# Patient Record
Sex: Female | Born: 1945 | Race: White | Hispanic: No | Smoking: Former smoker
Health system: Southern US, Community
[De-identification: ages and names within clinical notes are randomized; demographics above are authoritative.]

## PROBLEM LIST (undated history)

## (undated) DIAGNOSIS — K219 Gastro-esophageal reflux disease without esophagitis: Secondary | ICD-10-CM

## (undated) DIAGNOSIS — I10 Essential (primary) hypertension: Secondary | ICD-10-CM

## (undated) DIAGNOSIS — R7303 Prediabetes: Secondary | ICD-10-CM

## (undated) DIAGNOSIS — H269 Unspecified cataract: Secondary | ICD-10-CM

## (undated) DIAGNOSIS — I739 Peripheral vascular disease, unspecified: Secondary | ICD-10-CM

## (undated) DIAGNOSIS — C50911 Malignant neoplasm of unspecified site of right female breast: Secondary | ICD-10-CM

## (undated) DIAGNOSIS — M1990 Unspecified osteoarthritis, unspecified site: Secondary | ICD-10-CM

## (undated) DIAGNOSIS — E7800 Pure hypercholesterolemia, unspecified: Secondary | ICD-10-CM

## (undated) DIAGNOSIS — E785 Hyperlipidemia, unspecified: Secondary | ICD-10-CM

## (undated) DIAGNOSIS — C50919 Malignant neoplasm of unspecified site of unspecified female breast: Secondary | ICD-10-CM

## (undated) DIAGNOSIS — J189 Pneumonia, unspecified organism: Secondary | ICD-10-CM

## (undated) DIAGNOSIS — E119 Type 2 diabetes mellitus without complications: Secondary | ICD-10-CM

## (undated) DIAGNOSIS — B029 Zoster without complications: Secondary | ICD-10-CM

## (undated) HISTORY — DX: Malignant neoplasm of unspecified site of unspecified female breast: C50.919

## (undated) HISTORY — DX: Essential (primary) hypertension: I10

## (undated) HISTORY — PX: ILIAC ARTERY STENT: SHX1786

## (undated) HISTORY — DX: Peripheral vascular disease, unspecified: I73.9

## (undated) HISTORY — PX: TOENAIL TRIMMING: SHX6631

## (undated) HISTORY — DX: Unspecified osteoarthritis, unspecified site: M19.90

## (undated) HISTORY — DX: Gastro-esophageal reflux disease without esophagitis: K21.9

## (undated) HISTORY — DX: Unspecified cataract: H26.9

## (undated) HISTORY — DX: Pure hypercholesterolemia, unspecified: E78.00

## (undated) HISTORY — DX: Zoster without complications: B02.9

---
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
## 2002-08-24 HISTORY — PX: MENISCUS REPAIR: SHX5179

## 2007-08-25 HISTORY — PX: MENISCECTOMY: SHX123

## 2015-05-13 LAB — HM COLONOSCOPY

## 2015-10-25 DIAGNOSIS — I10 Essential (primary) hypertension: Secondary | ICD-10-CM | POA: Insufficient documentation

## 2015-10-25 DIAGNOSIS — E785 Hyperlipidemia, unspecified: Secondary | ICD-10-CM

## 2015-10-25 HISTORY — DX: Essential (primary) hypertension: I10

## 2015-10-25 HISTORY — DX: Hyperlipidemia, unspecified: E78.5

## 2015-10-25 LAB — LIPID PANEL
Cholesterol: 197 (ref 0–200)
HDL: 67 (ref 35–70)
LDL Cholesterol: 105
Triglycerides: 126 (ref 40–160)

## 2015-10-25 LAB — CBC AND DIFFERENTIAL
HCT: 40 (ref 36–46)
Hemoglobin: 13.5 (ref 12.0–16.0)
Platelets: 254 (ref 150–399)
WBC: 7.3

## 2015-10-25 LAB — BASIC METABOLIC PANEL
BUN: 15 (ref 4–21)
Creatinine: 0.7 (ref 0.5–1.1)
Glucose: 97
Potassium: 4.1 (ref 3.4–5.3)
Sodium: 139 (ref 137–147)

## 2015-10-25 LAB — VITAMIN D 25 HYDROXY (VIT D DEFICIENCY, FRACTURES): Vit D, 25-Hydroxy: 30.9

## 2015-10-25 LAB — HEPATIC FUNCTION PANEL
ALT: 29 (ref 7–35)
AST: 27 (ref 13–35)
Alkaline Phosphatase: 71 (ref 25–125)
Bilirubin, Total: 0.5

## 2015-10-25 LAB — TSH: TSH: 3.1 (ref 0.41–5.90)

## 2016-08-24 HISTORY — PX: COLONOSCOPY: SHX174

## 2016-08-24 HISTORY — PX: CATARACT EXTRACTION: SUR2

## 2016-11-09 LAB — HEPATIC FUNCTION PANEL
ALT: 38 — AB (ref 7–35)
AST: 33 (ref 13–35)
Alkaline Phosphatase: 67 (ref 25–125)
Bilirubin, Total: 0.6

## 2016-11-09 LAB — BASIC METABOLIC PANEL
BUN: 13 (ref 4–21)
Creatinine: 0.6 (ref 0.5–1.1)
Glucose: 128
Potassium: 4.1 (ref 3.4–5.3)
Sodium: 138 (ref 137–147)

## 2016-11-09 LAB — CBC AND DIFFERENTIAL
HCT: 41 (ref 36–46)
Hemoglobin: 13.6 (ref 12.0–16.0)
Platelets: 238 (ref 150–399)
WBC: 7

## 2016-11-09 LAB — LIPID PANEL
Cholesterol: 168 (ref 0–200)
HDL: 55 (ref 35–70)
LDL Cholesterol: 92
Triglycerides: 111 (ref 40–160)

## 2016-11-09 LAB — TSH: TSH: 2.06 (ref 0.41–5.90)

## 2017-11-16 LAB — BASIC METABOLIC PANEL
BUN: 19 (ref 4–21)
Creatinine: 0.6 (ref 0.5–1.1)
Glucose: 121
Potassium: 4.2 (ref 3.4–5.3)
Sodium: 138 (ref 137–147)

## 2017-11-16 LAB — CBC AND DIFFERENTIAL
HCT: 41 (ref 36–46)
Hemoglobin: 13.8 (ref 12.0–16.0)
Platelets: 242 (ref 150–399)
WBC: 6.4

## 2017-11-16 LAB — LIPID PANEL
Cholesterol: 176 (ref 0–200)
HDL: 57 (ref 35–70)
LDL Cholesterol: 102
Triglycerides: 78 (ref 40–160)

## 2017-11-16 LAB — HEPATIC FUNCTION PANEL
ALT: 34 (ref 7–35)
AST: 26 (ref 13–35)
Alkaline Phosphatase: 62 (ref 25–125)
Bilirubin, Total: 0.5

## 2017-11-16 LAB — TSH: TSH: 1.43 (ref 0.41–5.90)

## 2017-11-16 LAB — HEMOGLOBIN A1C: Hemoglobin A1C: 6.6

## 2017-11-19 DIAGNOSIS — R7303 Prediabetes: Secondary | ICD-10-CM | POA: Insufficient documentation

## 2017-11-19 HISTORY — DX: Prediabetes: R73.03

## 2018-10-18 DIAGNOSIS — C50911 Malignant neoplasm of unspecified site of right female breast: Secondary | ICD-10-CM

## 2018-10-18 DIAGNOSIS — C50812 Malignant neoplasm of overlapping sites of left female breast: Secondary | ICD-10-CM

## 2018-10-18 HISTORY — DX: Malignant neoplasm of overlapping sites of left female breast: C50.812

## 2018-10-18 HISTORY — DX: Malignant neoplasm of unspecified site of right female breast: C50.911

## 2019-01-14 LAB — BASIC METABOLIC PANEL
BUN: 17 (ref 4–21)
Creatinine: 0.7 (ref 0.5–1.1)
Glucose: 110
Potassium: 4.3 (ref 3.4–5.3)
Sodium: 136 — AB (ref 137–147)

## 2019-01-14 LAB — HEPATIC FUNCTION PANEL
ALT: 25 (ref 7–35)
AST: 23 (ref 13–35)
Alkaline Phosphatase: 83 (ref 25–125)
Bilirubin, Total: 0.7

## 2019-01-14 LAB — CBC AND DIFFERENTIAL
HCT: 41 (ref 36–46)
Hemoglobin: 14.3 (ref 12.0–16.0)
Platelets: 272 (ref 150–399)
WBC: 6.3

## 2019-01-14 LAB — PROTIME-INR: Protime: 9.9 — AB (ref 10.0–13.8)

## 2019-01-14 LAB — POCT INR: INR: 1 (ref 0.9–1.1)

## 2019-02-02 HISTORY — PX: MASTECTOMY: SHX3

## 2019-05-10 ENCOUNTER — Other Ambulatory Visit: Payer: Self-pay

## 2019-05-10 ENCOUNTER — Non-Acute Institutional Stay: Payer: Managed Care, Other (non HMO) | Admitting: Internal Medicine

## 2019-05-10 ENCOUNTER — Encounter: Payer: Self-pay | Admitting: Internal Medicine

## 2019-05-10 VITALS — BP 144/86 | HR 73 | Temp 99.1°F | Ht 67.5 in | Wt 171.0 lb

## 2019-05-10 DIAGNOSIS — E785 Hyperlipidemia, unspecified: Secondary | ICD-10-CM

## 2019-05-10 DIAGNOSIS — Z860100 Personal history of colon polyps, unspecified: Secondary | ICD-10-CM

## 2019-05-10 DIAGNOSIS — T451X5A Adverse effect of antineoplastic and immunosuppressive drugs, initial encounter: Secondary | ICD-10-CM

## 2019-05-10 DIAGNOSIS — C50911 Malignant neoplasm of unspecified site of right female breast: Secondary | ICD-10-CM

## 2019-05-10 DIAGNOSIS — M2550 Pain in unspecified joint: Secondary | ICD-10-CM

## 2019-05-10 DIAGNOSIS — R7303 Prediabetes: Secondary | ICD-10-CM

## 2019-05-10 DIAGNOSIS — M150 Primary generalized (osteo)arthritis: Secondary | ICD-10-CM

## 2019-05-10 DIAGNOSIS — K5790 Diverticulosis of intestine, part unspecified, without perforation or abscess without bleeding: Secondary | ICD-10-CM

## 2019-05-10 DIAGNOSIS — I10 Essential (primary) hypertension: Secondary | ICD-10-CM | POA: Diagnosis not present

## 2019-05-10 DIAGNOSIS — M8949 Other hypertrophic osteoarthropathy, multiple sites: Secondary | ICD-10-CM

## 2019-05-10 DIAGNOSIS — C50912 Malignant neoplasm of unspecified site of left female breast: Secondary | ICD-10-CM

## 2019-05-10 DIAGNOSIS — M159 Polyosteoarthritis, unspecified: Secondary | ICD-10-CM | POA: Insufficient documentation

## 2019-05-10 HISTORY — DX: Personal history of colon polyps, unspecified: Z86.0100

## 2019-05-10 HISTORY — DX: Adverse effect of antineoplastic and immunosuppressive drugs, initial encounter: T45.1X5A

## 2019-05-10 HISTORY — DX: Diverticulosis of intestine, part unspecified, without perforation or abscess without bleeding: K57.90

## 2019-05-10 HISTORY — DX: Adverse effect of antineoplastic and immunosuppressive drugs, initial encounter: M25.50

## 2019-05-10 HISTORY — DX: Primary generalized (osteo)arthritis: M15.0

## 2019-05-10 NOTE — Progress Notes (Signed)
Provider:  Rexene Edison. Mariea Clonts, D.O., C.M.D. Location:  Occupational psychologist of Service:  Clinic (12)  Previous PCP: Gayland Curry, DO Patient Care Team: Gayland Curry, DO as PCP - General (Geriatric Medicine)  Extended Emergency Contact Information Primary Emergency Contact: [place] Mobile Phone: [phone] Relation: Sister  Goals of Care: Advanced Directive information Advanced Directives 05/10/2019  Does Patient Have a Medical Advance Directive? Yes  Type of Paramedic of [place];Living will  Copy of [place] in Chart? No - copy requested   Chief Complaint  Patient presents with  . Establish Care    New patient establish care. Blood pressure concerns   . Immunizations    Will get flu vaccine at local pharmacy     HPI: Patient is a 73 y.o. female seen today to establish with [facility].  Records have been requested from prior PCP in [place]--she's still seeing them some through the end of the year so she will request records sent then (many in careeverywhere anyway).  She has a h/o htn, hyperlipidemia, breast cancer and arthritis.  She's had bilateral mastectomies 02/02/19.  BP has recently been an issue.  Before her surgery she was placed on anastrazole b/c it was thought that her estrogen supplement caused her cancer.  She was off of it for a bit before and for 3 wks after her sugery.   BP going up before surgery, then postop, it was better. 3 wks after her surgery, she was switched to letrozole--early July.  A few weeks ago, she felt lightheadedness and she checked her BP and it was 178/98.  She thought this is nuts.  She took herself off the letrozole and notified her oncologist.  It's taken a while, but bp is getting better. She'd been on a higher dose of candesartan preop due to the hypertension. She will now be on a new med for her breast cancer. BP runs a little higher in the am and improves  over the day.   Also had meniscus repair in 2004.   Had cataract extraction 2018. Last cscope 2018.  She has a family history of AD and colon ca in her mother and a sister who has osteoporosis.  Her mother lived to 59.    She drinks 1-2 glasses of wine per week and did smoke cigarettes for 25 pack-years quitting in 1995.    She is divorced.  She earned her Master's degree and was a Tourist information centre manager.    She exercises one hour per day mon-fri.  She has a living will, hcpoa.  She plans to get breast reconstruction in [place] and stay up there for the holidays.  She will need to establish with oncology here eventually.  She's using tart cherry and diclofenac gel.  Pre covid, she worked with a Clinical research associate.  Had not really done exercise like Robin.    Right knee is particularly bothersome.  Previously got cortisone shots in right knee and right shoulder.  She thinks it may be better with exercise again when this breast cancer ordeal is over.    She actually had genetic testing which was normal. She did take prempro for 20 years.  Fortunately, lymph nodes were benign and no need for chemo and radiation.  Uses systane for dry eyes.  She has gingivitis.  Has a partial.    Also has sinusitis ad dymista has helped tremendously.    Has diverticulosis.  Is overdue  for cscope.  Has had polyps both previously scopes.    Bone densities have been normal in the past.  Knows she should have one of those done again.  Last one she's not sure of.    Past Medical History:  Diagnosis Date  . Arthritis    Per [facility] New Patient Packet   . Breast cancer [facility])    Per [facility] New Patient Packet   . High blood pressure    Per [place] Patient Packet   . High cholesterol    Per [facility] New Patient Packet    Past Surgical History:  Procedure Laterality Date  . CATARACT EXTRACTION  2018   Per [facility] New Patient Packet, [place]  2018   Per [facility] new patient packet, Dr.Locker  .  MASTECTOMY Bilateral 02/02/2019   Per [facility] New Patient Packet, DrMichele Blackwood and Dr.Colon   . MENISCUS REPAIR  2004   Per [facility] Patient Packet, Dr. Milagros Reap  . TONSILLECTOMY  1951   Per [facility] New Patient Packet    Social History   Socioeconomic History  . Marital status: Divorced    Spouse name: Not on file  . Number of children: Not on file  . Years of education: Not on file  . Highest education level: Not on file  Occupational History  . Not on file  Social Needs  . Financial resource strain: Not on file  . Food insecurity    Worry: Not on file    Inability: Not on file  . Transportation needs    Medical: Not on file    Non-medical: Not on file  Tobacco Use  . Smoking status: Former Smoker    Years: 25.00    Types: Cigarettes    Quit date: 1995    Years since quitting: 25.7  . Smokeless tobacco: Never Used  Substance and Sexual Activity  . Alcohol use: Yes    Alcohol/week: 1.0 - 2.0 standard drinks    Types: 1 - 2 Glasses of wine per week  . Drug use: Not Currently  . Sexual activity: Not on file  Lifestyle  . Physical activity    Days per week: Not on file    Minutes per session: Not on file  . Stress: Not on file  Relationships  . Social Herbalist on phone: Not on file    Gets together: Not on file    Attends religious service: Not on file    Active member of club or organization: Not on file    Attends meetings of clubs or organizations: Not on file    Relationship status: Not on file  Other Topics Concern  . Not on file  Social History Narrative   Diet: Healthy      Caffeine: 1 cup of coffee in the morning       Married, if yes what year: Divorced, married in [place] you live in a house, apartment, assisted living, [place], trailer, ect: Apartment, more than 1 stories, 1 person.       Pets: No      Current/Past profession: Masters, Engineer, maintenance (IT)       Exercise: Yes, TV class 1 hour M-F         Living Will:  Yes   DNR: No   POA/HPOA: Yes      Functional Status:   Do you have difficulty bathing or dressing yourself? Did not  answer   Do you have difficulty preparing food or eating? Did not answer   Do you have difficulty managing your medications? Did not answer   Do you have difficulty managing your finances? Did not answer   Do you have difficulty affording your medications? Did not answer    reports that she quit smoking about 25 years ago. Her smoking use included cigarettes. She quit after 25.00 years of use. She has never used smokeless tobacco. She reports current alcohol use of about 1.0 - 2.0 standard drinks of alcohol per week. She reports previous drug use.  Functional Status Survey:    Family History  Problem Relation Age of Onset  . Alzheimer's disease Mother   . Colon cancer Mother   . Ulcers Father   . Osteoporosis Sister     Health Maintenance  Topic Date Due  . Hepatitis C Screening  07/[birth year]/46  . TETANUS/TDAP  05/12/1965  . MAMMOGRAM  05/12/1996  . COLONOSCOPY  05/12/1996  . DEXA SCAN  05/13/2011  . PNA vac Low Risk Adult (1 of 2 - PCV13) 05/13/2011  . INFLUENZA VACCINE  03/25/2019    No Known Allergies  Outpatient Encounter Medications as of 05/10/2019  Medication Sig  . Azelastine-Fluticasone (DYMISTA) 137-50 MCG/ACT SUSP daily.  . candesartan-hydrochlorothiazide (ATACAND HCT) 32-12.5 MG tablet Take 1 tablet by mouth daily.  . cherry syrup syrup Take 1.23 mLs by mouth 2 (two) times daily.  . Cholecalciferol (VITAMIN D3) 50 MCG (2000 UT) capsule Take 2,000 Units by mouth daily.  . diclofenac sodium (VOLTAREN) 1 % GEL Apply 2 g topically 2 (two) times daily.  Marland Kitchen esomeprazole (NEXIUM) 20 MG capsule Take 20 mg by mouth daily at 12 noon.  . Glucosamine 500 MG CAPS Take by mouth daily.  Vladimir Faster Glycol-Propyl Glycol (SYSTANE) 0.4-0.3 % SOLN Apply to eye daily.  . psyllium (METAMUCIL) 58.6 % packet Take 1 packet by mouth daily.  . rosuvastatin (CRESTOR) 10 MG  tablet Take 10 mg by mouth daily.   No facility-administered encounter medications on file as of 05/10/2019.     Review of Systems  Constitutional: Negative for chills, fever and malaise/fatigue.  HENT: Positive for hearing loss.        Mild age-related hearing loss  Eyes: Negative for blurred vision.       Glasses, dry eyes--uses systane, prior cataract surgery, plans to see ophtho in [place] before returning here early next here  Respiratory: Negative for cough and shortness of breath.   Cardiovascular: Negative for chest pain, palpitations and leg swelling.  Gastrointestinal: Negative for abdominal pain, blood in stool, constipation, diarrhea, heartburn and melena.  Genitourinary: Negative for dysuria.       Had some incontinence when on the anastrazole and letrozole--not currently on one of these--waiting to receive med  Musculoskeletal: Positive for joint pain. Negative for falls.  Skin: Negative for itching and rash.  Neurological: Negative for dizziness and loss of consciousness.  Endo/Heme/Allergies: Positive for environmental allergies.  Psychiatric/Behavioral: Negative for depression and memory loss. The patient is not nervous/anxious and does not have insomnia.    Vitals:   05/10/19 1019  BP: (!) 144/86  Pulse: 73  Temp: 99.1 F (37.3 C)  TempSrc: Oral  SpO2: 98%  Weight: 171 lb (77.6 kg)  Height: 5' 7.5" (1.715 m)   Body mass index is 26.39 kg/m. Physical Exam Vitals signs reviewed.  Constitutional:      General: She is not in acute distress.    Appearance: Normal  appearance. She is not ill-appearing, toxic-appearing or diaphoretic.  HENT:     Head: Normocephalic and atraumatic.     Right Ear: External ear normal.     Left Ear: External ear normal.  Eyes:     Comments: glasses  Cardiovascular:     Rate and Rhythm: Normal rate and regular rhythm.     Pulses: Normal pulses.     Heart sounds: Normal heart sounds.  Pulmonary:     Effort: Pulmonary effort is  normal.     Breath sounds: Normal breath sounds.  Abdominal:     General: Bowel sounds are normal.  Musculoskeletal: Normal range of motion.  Skin:    General: Skin is warm and dry.     Capillary Refill: Capillary refill takes less than 2 seconds.  Neurological:     General: No focal deficit present.     Mental Status: She is alert and oriented to person, place, and time.  Psychiatric:        Mood and Affect: Mood normal.        Behavior: Behavior normal.        Thought Content: Thought content normal.        Judgment: Judgment normal.     Labs reviewed: Will get labs before CPE in January  Imaging and Procedures noted on new patient packet: 2018 cscope with Dr. Caffie Damme 2020 mammogram, MRI, biopsies with Dr. Pasty Arch  Assessment/Plan 1. Bilateral malignant neoplasm of breast in female, unspecified estrogen receptor status, unspecified site of breast ([place]) -she has had bilateral mastectomies and is struggling tolerating aromatase inhibitors due to achy joints, vaginal dryness and some increased incontinence -reportedly is going to be trying a new one but has not received it by mail yet  2. Prediabetes -will plan to f/u hba1c, exercise limited lately amid move, covid and recent cancer surgery over the summer  3. Essential hypertension -bp elevated today and had been higher while on aromatase inhibitors by her report, plus she's been unable to exercise with her recent surgery and moving -will monitor at this point given this was our first visit and she's not even staying her permanently until the new year  4. Hyperlipidemia -is on crestor therapy, cont same regimen and f/u FLP before CPE in Jan  5. History of colon polyps -noted; reports need for f/u cscope here (looks like earliest it would be due is next year (would be 3 yrs from the 2018 cscope)  6. Primary osteoarthritis involving multiple joints -bothered by knee and shoulder which she may need evaluated further so she  can get back to exercise after she gets through her breast reconstruction coming up in [place]  7. Diverticulosis -noted previously on cscope, but has not had any diverticulitis  8. Aromatase inhibitor-associated arthralgia -is switching to a new one--will see how she does with it -will need bone density  Will need cscope, bone density, mammos here after returns in new year May need right knee evaluated Up to date on vaccines  Labs/tests ordered:  Cbc, cmp, flp, hba1c before Next visit:  09/20/2019 for CPE  Lalena Salas L. Leyton Magoon, D.O. [facility] [address], [place] [address] Cell Phone (Mon-Fri 8am-5pm):  [phone] On Call:  [phone] & follow prompts after 5pm & weekends Office Phone:  [phone] Office Fax:  [phone]

## 2019-06-01 ENCOUNTER — Encounter: Payer: Self-pay | Admitting: Internal Medicine

## 2019-09-14 ENCOUNTER — Encounter: Payer: Self-pay | Admitting: Internal Medicine

## 2019-09-14 LAB — BASIC METABOLIC PANEL
BUN: 20 (ref 4–21)
CO2: 27 — AB (ref 13–22)
Chloride: 101 (ref 99–108)
Creatinine: 0.6 (ref 0.5–1.1)
Glucose: 138
Potassium: 4.5 (ref 3.4–5.3)
Sodium: 141 (ref 137–147)

## 2019-09-14 LAB — LIPID PANEL
Cholesterol: 212 — AB (ref 0–200)
LDL Cholesterol: 106
Triglycerides: 211 — AB (ref 40–160)

## 2019-09-14 LAB — COMPREHENSIVE METABOLIC PANEL
Albumin: 5 (ref 3.5–5.0)
Calcium: 10.5 (ref 8.7–10.7)
Globulin: 2.9

## 2019-09-14 LAB — CBC AND DIFFERENTIAL
HCT: 43 (ref 36–46)
Hemoglobin: 14.8 (ref 12.0–16.0)
Platelets: 239 (ref 150–399)
WBC: 5.9

## 2019-09-14 LAB — CBC: RBC: 4.46 (ref 3.87–5.11)

## 2019-09-14 LAB — HEMOGLOBIN A1C: Hemoglobin A1C: 6

## 2019-09-15 ENCOUNTER — Telehealth: Payer: Self-pay

## 2019-09-15 ENCOUNTER — Encounter: Payer: Self-pay | Admitting: Internal Medicine

## 2019-09-15 NOTE — Telephone Encounter (Signed)
Per Dr. Mariea Clonts  Calcium slightly high recommend no more than 1200 mg of calcium per day blood counts normal, cells appear large which is common with B12 deficiency Sugar average 6 + prediabetic range.  Cholesterol elevated- bad +starchy will discuss @ next appointment.

## 2019-09-20 ENCOUNTER — Non-Acute Institutional Stay (INDEPENDENT_AMBULATORY_CARE_PROVIDER_SITE_OTHER): Payer: Managed Care, Other (non HMO) | Admitting: Internal Medicine

## 2019-09-20 ENCOUNTER — Telehealth: Payer: Self-pay | Admitting: Gastroenterology

## 2019-09-20 ENCOUNTER — Other Ambulatory Visit: Payer: Self-pay

## 2019-09-20 ENCOUNTER — Encounter: Payer: Self-pay | Admitting: Internal Medicine

## 2019-09-20 VITALS — BP 118/72 | HR 75 | Temp 96.8°F | Ht 67.0 in | Wt 180.6 lb

## 2019-09-20 DIAGNOSIS — R7303 Prediabetes: Secondary | ICD-10-CM

## 2019-09-20 DIAGNOSIS — Z860100 Personal history of colon polyps, unspecified: Secondary | ICD-10-CM

## 2019-09-20 DIAGNOSIS — C50911 Malignant neoplasm of unspecified site of right female breast: Secondary | ICD-10-CM | POA: Diagnosis not present

## 2019-09-20 DIAGNOSIS — E2839 Other primary ovarian failure: Secondary | ICD-10-CM

## 2019-09-20 DIAGNOSIS — Z0000 Encounter for general adult medical examination without abnormal findings: Secondary | ICD-10-CM

## 2019-09-20 DIAGNOSIS — M150 Primary generalized (osteo)arthritis: Secondary | ICD-10-CM

## 2019-09-20 DIAGNOSIS — T451X5A Adverse effect of antineoplastic and immunosuppressive drugs, initial encounter: Secondary | ICD-10-CM

## 2019-09-20 DIAGNOSIS — M1811 Unilateral primary osteoarthritis of first carpometacarpal joint, right hand: Secondary | ICD-10-CM

## 2019-09-20 DIAGNOSIS — M159 Polyosteoarthritis, unspecified: Secondary | ICD-10-CM

## 2019-09-20 DIAGNOSIS — I10 Essential (primary) hypertension: Secondary | ICD-10-CM | POA: Diagnosis not present

## 2019-09-20 DIAGNOSIS — M2550 Pain in unspecified joint: Secondary | ICD-10-CM

## 2019-09-20 DIAGNOSIS — C50912 Malignant neoplasm of unspecified site of left female breast: Secondary | ICD-10-CM

## 2019-09-20 DIAGNOSIS — M8949 Other hypertrophic osteoarthropathy, multiple sites: Secondary | ICD-10-CM

## 2019-09-20 DIAGNOSIS — Z79811 Long term (current) use of aromatase inhibitors: Secondary | ICD-10-CM

## 2019-09-20 DIAGNOSIS — E785 Hyperlipidemia, unspecified: Secondary | ICD-10-CM

## 2019-09-20 DIAGNOSIS — H5213 Myopia, bilateral: Secondary | ICD-10-CM

## 2019-09-20 NOTE — Telephone Encounter (Signed)
Dr. Tarri Glenn, there is a referral for colonoscopy, hx of polyps.  Pt had a colonoscopy in 2016.  She prefers a female MD.  Records are in [place] and will be printed and sent to you for review.  Please advise scheduling.

## 2019-09-20 NOTE — Progress Notes (Signed)
Provider:  Rexene Edison. Mariea Clonts, D.O., C.M.D. Location:  Occupational psychologist of Service:  Clinic (12)  Previous PCP: Gayland Curry, DO Patient Care Team: Gayland Curry, DO as PCP - General (Geriatric Medicine)  Extended Emergency Contact Information Primary Emergency Contact: [place] Mobile Phone: [phone] Relation: Sister  Goals of Care: Advanced Directive information Advanced Directives 09/20/2019  Does Patient Have a Medical Advance Directive? Yes  Type of Advance Directive [place]  Does patient want to make changes to medical advance directive? No - Patient declined  Copy of [place] in Chart? -      Chief Complaint  Patient presents with  . Annual Exam    yearly physical / lab results l    HPI: Patient is a 74 y.o. female seen today for an annual physical exam and to review her labs.  She had first established in Sept.  We have received her records and I have now reviewed them and sent the portions to scan that were crucial and not in care everywhere.  She returned to [facility] to finish up her surgeries for her breast cancer.  She had her reconstruction Nov 6th--extenders removed and implants put in.  No pain.  She still does not have the energy or stamina she had.  Came back to [place] after being with her children over the holidays.  She was on exemestane when we met.  It was better in terms of her blood pressure.  BP is still a little [place] but in normal range.    In October, she developed bad arthritis in her right hand.  Nurse suggested she come off the exemestane and her hand did not improve.  Before the cancer and meds, right knee and right shoulder required shots for arthritis.  She has gone back on exemestane for 1 week to 10 days.    The arthritis is at times scary.  Her hand will stiffen to where she can hardly bend it.  Her right thumb actually clicks.  If she tries to pick  things up with her thumb, it's really painful.  She takes tylenol 8 hr at hs, puts diclofenac on shoulder and now hands and cbd oil sublingual.  She is sleeping ok.    She did have knee OA cortisone injection.  She's interested in getting gel shots in the future.  She's ok right now with her knees at this time.  Has had colon polyps.    Is overdue for ophtho exam.  Had prior cataract surgery.    Got her flu shot at [facility] here in September.  Reviewed labs: hba1c 6--will work on wt loss with diet, exercise and decreased wine  Past Medical History:  Diagnosis Date  . Arthritis    Per [facility] New Patient Packet   . Bilateral malignant neoplasm of breast in female [facility]) 10/18/2018  . Breast cancer [facility])    Per [place] New Patient Packet   . Essential hypertension 10/25/2015  . High blood pressure    Per [facility] Patient Packet   . High cholesterol    Per [place] [facility] New Patient Packet   . Hyperlipidemia LDL goal <130 10/25/2015  . Prediabetes 11/19/2017   Past Surgical History:  Procedure Laterality Date  . CATARACT EXTRACTION  2018   Per [place] New Patient Packet, [place]     x2  . COLONOSCOPY  2018   Per [facility] new patient  packet, Dr.Locker  . MASTECTOMY Bilateral 02/02/2019   Per [facility] New Patient Packet, DrMichele Blackwood and Dr.Colon   . MENISCECTOMY Right 2009  . MENISCUS REPAIR  2004   Per [facility] Patient Packet, Dr. Milagros Reap  . TONSILLECTOMY  1951   Per [place] New Patient Packet    reports that she quit smoking about 26 years ago. Her smoking use included cigarettes. She quit after 25.00 years of use. She has never used smokeless tobacco. She reports current alcohol use of about 1.0 - 2.0 standard drinks of alcohol per week. She reports previous drug use.  Functional Status Survey:  independent  Family History  Problem Relation Age of Onset  . Alzheimer's disease Mother   . Colon cancer Mother   . Ulcers Father   . Parkinsonism Father   . Osteoporosis Sister      Health Maintenance  Topic Date Due  . Hepatitis C Screening  [birth year]  . MAMMOGRAM  09/16/2020  . TETANUS/TDAP  08/27/2024  . COLONOSCOPY  03/14/2028  . INFLUENZA VACCINE  Completed  . DEXA SCAN  Completed  . PNA vac Low Risk Adult  Completed    No Known Allergies  Outpatient Encounter Medications as of 09/20/2019  Medication Sig  . Azelastine-Fluticasone (DYMISTA) 137-50 MCG/ACT SUSP daily.  . candesartan-hydrochlorothiazide (ATACAND HCT) 32-12.5 MG tablet Take 1 tablet by mouth daily.  . Cholecalciferol (VITAMIN D3) 50 MCG (2000 UT) capsule Take 2,000 Units by mouth daily.  . diclofenac sodium (VOLTAREN) 1 % GEL Apply 2 g topically 2 (two) times daily.  Marland Kitchen esomeprazole (NEXIUM) 20 MG capsule Take 20 mg by mouth daily at 12 noon.  Marland Kitchen exemestane (AROMASIN) 25 MG tablet Take by mouth.  . Glucosamine 500 MG CAPS Take by mouth daily.  Vladimir Faster Glycol-Propyl Glycol (SYSTANE) 0.4-0.3 % SOLN Apply to eye daily.  . psyllium (METAMUCIL) 58.6 % packet Take 1 packet by mouth daily.  . rosuvastatin (CRESTOR) 10 MG tablet Take 10 mg by mouth daily.  . [DISCONTINUED] cherry syrup syrup Take 1.23 mLs by mouth 2 (two) times daily.   No facility-administered encounter medications on file as of 09/20/2019.    Review of Systems  Constitutional: Negative for chills, fever and malaise/fatigue.  HENT: Negative for congestion, hearing loss and sore throat.   Eyes: Negative for blurred vision.       Glasses  Respiratory: Negative for cough and shortness of breath.   Cardiovascular: Negative for chest pain, palpitations and leg swelling.  Gastrointestinal: Negative for abdominal pain, blood in stool, constipation, diarrhea, melena, nausea and vomiting.       H/o colon polyps  Genitourinary: Negative for dysuria.  Musculoskeletal: Positive for joint pain. Negative for falls.  Skin: Negative for itching and rash.  Neurological: Negative for dizziness and loss of consciousness.    Endo/Heme/Allergies: Does not bruise/bleed easily.  Psychiatric/Behavioral: Negative for depression and memory loss. The patient is not nervous/anxious and does not have insomnia.     Vitals:   09/20/19 1059  BP: 118/72  Pulse: 75  Temp: (!) 96.8 F (36 C)  TempSrc: Temporal  SpO2: 98%  Weight: 180 lb 9.6 oz (81.9 kg)  Height: 5' 7"  (1.702 m)   Body mass index is 28.29 kg/m. Physical Exam Vitals reviewed.  Constitutional:      General: She is not in acute distress.    Appearance: Normal appearance. She is not ill-appearing or toxic-appearing.  HENT:     Head: Normocephalic and atraumatic.  Eyes:  Comments: glasses  Cardiovascular:     Rate and Rhythm: Normal rate and regular rhythm.     Pulses: Normal pulses.     Heart sounds: Normal heart sounds.  Pulmonary:     Effort: Pulmonary effort is normal.     Breath sounds: Normal breath sounds. No wheezing, rhonchi or rales.  Abdominal:     General: Bowel sounds are normal. There is no distension.     Palpations: Abdomen is soft. There is no mass.     Tenderness: There is no abdominal tenderness.  Musculoskeletal:        General: Normal range of motion.     Right lower leg: No edema.     Left lower leg: No edema.  Lymphadenopathy:     Cervical: No cervical adenopathy.  Skin:    General: Skin is warm and dry.  Neurological:     General: No focal deficit present.     Mental Status: She is alert and oriented to person, place, and time.  Psychiatric:        Mood and Affect: Mood normal.        Behavior: Behavior normal.        Thought Content: Thought content normal.        Judgment: Judgment normal.     Labs reviewed: Basic Metabolic Panel: Recent Labs    01/14/19 0000 09/14/19 0500  NA 136* 141  K 4.3 4.5  CL  --  101  CO2  --  27*  BUN 17 20  CREATININE 0.7 0.6  CALCIUM  --  10.5   Liver Function Tests: Recent Labs    01/14/19 0000 09/14/19 0500  AST 23  --   ALT 25  --   ALKPHOS 83  --    ALBUMIN  --  5.0   No results for input(s): LIPASE, AMYLASE in the last 8760 hours. No results for input(s): AMMONIA in the last 8760 hours. CBC: Recent Labs    01/14/19 0000 09/14/19 0500  WBC 6.3 5.9  HGB 14.3 14.8  HCT 41 43  PLT 272 239   Cardiac Enzymes: No results for input(s): CKTOTAL, CKMB, CKMBINDEX, TROPONINI in the last 8760 hours. BNP: Invalid input(s): POCBNP Lab Results  Component Value Date   HGBA1C 6.0 09/14/2019   Lab Results  Component Value Date   TSH 1.43 11/16/2017    Assessment/Plan 1. History of colon polyps - the last report I can find in her records is from 05/13/15 with sessile polyps not 03/14/18 though this is in her health maintenance and in care everywhere w/o any report attached--pt reports she is due - Ambulatory referral to Gastroenterology  2. Bilateral malignant neoplasm of breast in female, unspecified estrogen receptor status, unspecified site of breast ([place]) -s/p bilateral mastectomy, apparently hormone positive--could not find precise ER/PR/HER2neu documentation in her care everywhere info -needs to establish here with oncology for further monitoring -also referred to gyn for her pap smears and pelvic exams b/c she developed breast cancer due to being on hormone replacement for a long time - Ambulatory referral to Gynecology - Ambulatory referral to Hematology / Oncology  3. Prediabetes -counseled on this--she knows she needs to reduce wine intake and exercise Lab Results  Component Value Date   HGBA1C 6.0 09/14/2019    4. Essential hypertension -bp now in normal range though oscillates -cont candesartan/hctz daily -avoid oral nsaids that will worsen  5. Hyperlipidemia LDL goal <130 -cont crestor 25m--plans to up her exercise  Lab  Results  Component Value Date   LDLCALC 106 09/14/2019    6. Primary osteoarthritis involving multiple joints - discussed tylenol, topicals and injections as safest options, getting back to  regular exercise program also crucial--she's not had a routine in a long time with her breast cancer and then her move - Ambulatory referral to Orthopedic Surgery  7. Arthritis of carpometacarpal (CMC) joint of right thumb - newly much worse, will send for possible injection to allow her to function better - Ambulatory referral to Orthopedic Surgery  8. Aromatase inhibitor-associated arthralgia - doing better on current agent -Ambulatory referral to Hematology / Oncology  9. Long term current use of aromatase inhibitor - definitely needs bone density with use of exemestane and prior short term use of other aromatase inhibitors - DG Bone Density; Future - Ambulatory referral to Gynecology - Ambulatory referral to Hematology / Oncology  10. Estrogen deficiency - will obtain bone density here locally - DG Bone Density; Future  11. Myopia of both eyes -recommended she see Dr. Ellie Lunch here at [facility] - Ambulatory referral to Ophthalmology  Labs/tests ordered:  Consider FLP and hba1c recheck at 6 mos  [place]. Tesha Archambeau, D.O. [facility] [address], [place] [address] Cell Phone (Mon-Fri 8am-5pm):  [phone] On Call:  [phone] & follow prompts after 5pm & weekends Office Phone:  [phone] Office Fax:  [phone]

## 2019-09-22 ENCOUNTER — Other Ambulatory Visit: Payer: Self-pay

## 2019-09-25 ENCOUNTER — Ambulatory Visit: Payer: Managed Care, Other (non HMO) | Admitting: Obstetrics & Gynecology

## 2019-09-25 ENCOUNTER — Other Ambulatory Visit: Payer: Self-pay

## 2019-09-25 ENCOUNTER — Encounter: Payer: Self-pay | Admitting: Obstetrics & Gynecology

## 2019-09-25 VITALS — BP 144/88 | Ht 66.5 in | Wt 177.0 lb

## 2019-09-25 DIAGNOSIS — Z853 Personal history of malignant neoplasm of breast: Secondary | ICD-10-CM

## 2019-09-25 DIAGNOSIS — Z780 Asymptomatic menopausal state: Secondary | ICD-10-CM | POA: Diagnosis not present

## 2019-09-25 DIAGNOSIS — Z170 Estrogen receptor positive status [ER+]: Secondary | ICD-10-CM

## 2019-09-25 DIAGNOSIS — Z9289 Personal history of other medical treatment: Secondary | ICD-10-CM | POA: Diagnosis not present

## 2019-09-25 DIAGNOSIS — Z01419 Encounter for gynecological examination (general) (routine) without abnormal findings: Secondary | ICD-10-CM | POA: Diagnosis not present

## 2019-09-25 DIAGNOSIS — C50919 Malignant neoplasm of unspecified site of unspecified female breast: Secondary | ICD-10-CM

## 2019-09-25 NOTE — Progress Notes (Signed)
Victoria Wolfe [birth year] [id number]   History:    74 y.o.  G2P2L2  Moved recently to [facility].  RP:  New patient presenting for Annual Gyn exam  HPI:  Postmenopause, well on no HRT.  No PMB.  No pelvic pain.  Abstinent.  Dx of Stage 1 Breast Ca ER pos.  BrCa1-2 Negative.  Had Bilateral Mastectomy and reconstruction in 2020.  On Aromasin.  Colonoscopy 2019 Polyps, on a 3 yr schedule.  BD scheduled.  Health labs with Fam MD.  Past medical history,surgical history, family history and social history were all reviewed and documented in the EPIC chart.  Gynecologic History No LMP recorded. Patient is postmenopausal.  Obstetric History OB History  Gravida Para Term Preterm AB Living  2 2 2         SAB TAB Ectopic Multiple Live Births               # Outcome Date GA Lbr Len/2nd Weight Sex Delivery Anes PTL Lv  2 Term           1 Term              ROS: A ROS was performed and pertinent positives and negatives are included in the history.  GENERAL: No fevers or chills. HEENT: No change in vision, no earache, sore throat or sinus congestion. NECK: No pain or stiffness. CARDIOVASCULAR: No chest pain or pressure. No palpitations. PULMONARY: No shortness of breath, cough or wheeze. GASTROINTESTINAL: No abdominal pain, nausea, vomiting or diarrhea, melena or bright red blood per rectum. GENITOURINARY: No urinary frequency, urgency, hesitancy or dysuria. MUSCULOSKELETAL: No joint or muscle pain, no back pain, no recent trauma. DERMATOLOGIC: No rash, no itching, no lesions. ENDOCRINE: No polyuria, polydipsia, no heat or cold intolerance. No recent change in weight. HEMATOLOGICAL: No anemia or easy bruising or bleeding. NEUROLOGIC: No headache, seizures, numbness, tingling or weakness. PSYCHIATRIC: No depression, no loss of interest in normal activity or change in sleep pattern.     Exam:   BP (!) 144/88 (BP Location: Right Arm, Patient Position: Sitting, Cuff Size: Normal)    Ht 5' 6.5" (1.689 m)   Wt 177 lb (80.3 kg)   BMI 28.14 kg/m   Body mass index is 28.14 kg/m.  General appearance : Well developed well nourished female. No acute distress HEENT: Eyes: no retinal hemorrhage or exudates,  Neck supple, trachea midline, no carotid bruits, no thyroidmegaly Lungs: Clear to auscultation, no rhonchi or wheezes, or rib retractions  Heart: Regular rate and rhythm, no murmurs or gallops Breast:Examined in sitting and supine position were symmetrical in appearance, no palpable masses or tenderness,  no skin retraction, no nipple inversion, no nipple discharge, no skin discoloration, no axillary or supraclavicular lymphadenopathy Abdomen: no palpable masses or tenderness, no rebound or guarding Extremities: no edema or skin discoloration or tenderness  Pelvic: Vulva: Normal             Vagina: No gross lesions or discharge  Cervix: No gross lesions or discharge.  Pap reflex done.  Uterus  AV, normal size, shape and consistency, non-tender and mobile  Adnexa  Without masses or tenderness  Anus: Normal   Assessment/Plan:  74 y.o. female for annual exam   1. Encounter for routine gynecological examination with Papanicolaou smear of cervix Normal gynecologic exam in menopause.  Pap reflex done.  Breast exam status post bilateral mastectomy with reconstruction.  We will no longer do screening mammograms.  Colonoscopy to  schedule in 2022.  Health labs with family physician Dr. Mariea Clonts.  2. Postmenopause Postmenopause, well on no HRT. Stopped HRT after 20 yrs when Dxed with Breast Cancer.  No PMB.  Scheduled for a Bone Density.  Vit D supplements, Ca++ intake total at 1200 mg daily, regular weightbearing physical activities.  3. 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  On Aromasin.  Princess Bruins MD, 4:12 PM 09/25/2019

## 2019-09-25 NOTE — Patient Instructions (Signed)
1. Encounter for routine gynecological examination with Papanicolaou smear of cervix Normal gynecologic exam in menopause.  Pap reflex done.  Breast exam status post bilateral mastectomy with reconstruction.  We will no longer do screening mammograms.  Colonoscopy to schedule in 2022.  Health labs with family physician Dr. Mariea Clonts.  2. Postmenopause Postmenopause, well on no HRT. Stopped HRT after 20 yrs when Dxed with Breast Cancer.  No PMB.  Scheduled for a Bone Density.  Vit D supplements, Ca++ intake total at 1200 mg daily, regular weightbearing physical activities.  3. 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  On Aromasin.  Victoria Wolfe, it was a pleasure meeting you today!  I will inform you of your results as soon as they are available.

## 2019-09-26 ENCOUNTER — Ambulatory Visit: Payer: Managed Care, Other (non HMO) | Admitting: Orthopaedic Surgery

## 2019-09-26 ENCOUNTER — Ambulatory Visit (INDEPENDENT_AMBULATORY_CARE_PROVIDER_SITE_OTHER): Payer: Managed Care, Other (non HMO)

## 2019-09-26 ENCOUNTER — Encounter: Payer: Self-pay | Admitting: Orthopaedic Surgery

## 2019-09-26 DIAGNOSIS — M65311 Trigger thumb, right thumb: Secondary | ICD-10-CM

## 2019-09-26 DIAGNOSIS — M65341 Trigger finger, right ring finger: Secondary | ICD-10-CM

## 2019-09-26 MED ORDER — BUPIVACAINE HCL 0.25 % IJ SOLN
0.3300 mL | INTRAMUSCULAR | Status: AC | PRN
  Administered 2019-09-26: 16:00:00 .33 mL

## 2019-09-26 MED ORDER — METHYLPREDNISOLONE ACETATE 40 MG/ML IJ SUSP
13.3300 mg | INTRAMUSCULAR | Status: AC | PRN
  Administered 2019-09-26: 16:00:00 13.33 mg

## 2019-09-26 MED ORDER — LIDOCAINE HCL 1 % IJ SOLN
1.0000 mL | INTRAMUSCULAR | Status: AC | PRN
  Administered 2019-09-26: 16:00:00 1 mL

## 2019-09-26 NOTE — Progress Notes (Signed)
Office Visit Note   Patient: Victoria Wolfe           Date of Birth: October 26, 1945           MRN: [id number] Visit Date: 09/26/2019              Requested by: Gayland Curry, DO [place],  [place] [address] PCP: Gayland Curry, DO   Assessment & Plan: Visit Diagnoses:  1. Trigger finger of right thumb   2. Trigger finger, right ring finger     Plan: Impression is right thumb and ring trigger fingers.  We discussed cortisone injection to both fingers.  The ring finger is mildly symptomatic so she would like to proceed only with the trigger thumb injection.  She will follow up with [place] as needed.  Follow-Up Instructions: Return if symptoms worsen or fail to improve.   Orders:  Orders Placed This Encounter  Procedures  . Hand/UE Inj: R thumb A1  . XR Finger Thumb Right   No orders of the defined types were placed in this encounter.     Procedures: Hand/UE Inj: R thumb A1 for trigger finger on 09/26/2019 3:43 PM Indications: pain Details: 25 G needle Medications: 1 mL lidocaine 1 %; 0.33 mL bupivacaine 0.25 %; 13.33 mg methylPREDNISolone acetate 40 MG/ML      Clinical Data: No additional findings.   Subjective: Chief Complaint  Patient presents with  . Right Thumb - Pain    HPI patient is a pleasant right-hand-dominant 74 year old female who comes in today with right thumb and right ring finger pain and triggering.  This has been ongoing since October or November 2020 after starting a new medication for her breast cancer.  She notes that this medication is exacerbated the underlying arthritis to her entire body.  The pain she is having to the thumb and ring finger is more associated with triggering.  The ring finger is less bothersome.  She has notice that the pain and triggering is typically worse in the morning.  Review of Systems as detailed in HPI.  All others reviewed and are negative.   Objective: Vital Signs: There were no vitals taken for this  visit.  Physical Exam well-developed well-nourished female no acute distress.  Alert and oriented x3.  Ortho Exam examination of her right hand reveals moderate tenderness and a palpable nodule to the A1 pulley at the thumb and ring fingers.  She does have active triggering to both fingers.  No tenderness at the first [facility] joint.  Negative grind test.  Full range of motion.  She is neurovascular intact distally.  Specialty Comments:  No specialty comments available.  Imaging: XR Finger Thumb Right  Result Date: 09/26/2019 Minimal degenerative changes to the first [facility] joint    PMFS History: Patient Active Problem List   Diagnosis Date Noted  . History of colon polyps 05/10/2019  . Diverticulosis 05/10/2019  . 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 Bilateral malignant neoplasm of breast in female [facility]) 10/18/2018  . Prediabetes 11/19/2017  . Essential hypertension 10/25/2015  . Hyperlipidemia LDL goal <130 10/25/2015   Past Medical History:  Diagnosis Date  . Arthritis    Per [facility] New Patient Packet   . Bilateral malignant neoplasm of breast in female [facility]) 10/18/2018  . Breast cancer [facility])    Per [place] New Patient Packet   . Essential hypertension 10/25/2015  . High blood pressure  Per [facility] New Patient Packet   . High cholesterol    Per [place] New Patient Packet   . Hyperlipidemia LDL goal <130 10/25/2015  . Prediabetes 11/19/2017    Family History  Problem Relation Age of Onset  . Alzheimer's disease Mother   . Colon cancer Mother   . Ulcers Father   . Parkinsonism Father   . Osteoporosis Sister     Past Surgical History:  Procedure Laterality Date  . CATARACT EXTRACTION  2018   Per [place] New Patient Packet, [place]     x2  . COLONOSCOPY  2018   Per [place] new patient packet, Dr.Locker  . MASTECTOMY Bilateral 02/02/2019   Per [place] New Patient Packet, DrMichele  Blackwood and Dr.Colon   . MENISCECTOMY Right 2009  . MENISCUS REPAIR  2004   Per [place] Patient Packet, Dr. Milagros Reap  . TONSILLECTOMY  1951   Per [facility] New Patient Packet   Social History   Occupational History  . Not on file  Tobacco Use  . Smoking status: Former Smoker    Years: 25.00    Types: Cigarettes    Quit date: 1995    Years since quitting: 26.1  . Smokeless tobacco: Never Used  Substance and Sexual Activity  . Alcohol use: Yes    Alcohol/week: 1.0 - 2.0 standard drinks    Types: 1 - 2 Glasses of wine per week  . Drug use: Not Currently  . Sexual activity: Not Currently

## 2019-09-26 NOTE — Addendum Note (Signed)
Addended by: Gae Gallop T on: 09/26/2019 08:37 AM   Modules accepted: Orders

## 2019-09-27 LAB — PAP IG W/ RFLX HPV ASCU

## 2019-10-04 ENCOUNTER — Other Ambulatory Visit: Payer: Managed Care, Other (non HMO)

## 2019-10-17 ENCOUNTER — Ambulatory Visit
Admission: RE | Admit: 2019-10-17 | Discharge: 2019-10-17 | Disposition: A | Discharge status: Home / Self Care | Payer: Managed Care, Other (non HMO) | Source: Ambulatory Visit | Attending: Internal Medicine | Admitting: Internal Medicine

## 2019-10-17 ENCOUNTER — Other Ambulatory Visit: Payer: Self-pay

## 2019-10-17 DIAGNOSIS — Z79811 Long term (current) use of aromatase inhibitors: Secondary | ICD-10-CM

## 2019-10-17 DIAGNOSIS — E2839 Other primary ovarian failure: Secondary | ICD-10-CM

## 2019-10-17 NOTE — Progress Notes (Signed)
Fortunately, bone density remains normal despite aromatase inhibitor use and postmenopausal status.  I do recommend she continue with vitamin D3 at least 2000 units daily and be faithful with a weightbearing exercise program including walking, some weights and some balance exercise.  She should avoid excessive wine intake

## 2019-10-23 ENCOUNTER — Encounter (INDEPENDENT_AMBULATORY_CARE_PROVIDER_SITE_OTHER): Payer: Managed Care, Other (non HMO) | Admitting: Ophthalmology

## 2019-10-23 ENCOUNTER — Other Ambulatory Visit: Payer: Self-pay

## 2019-10-23 DIAGNOSIS — H33102 Unspecified retinoschisis, left eye: Secondary | ICD-10-CM | POA: Diagnosis not present

## 2019-10-23 DIAGNOSIS — H43813 Vitreous degeneration, bilateral: Secondary | ICD-10-CM

## 2019-10-23 DIAGNOSIS — H35033 Hypertensive retinopathy, bilateral: Secondary | ICD-10-CM | POA: Diagnosis not present

## 2019-10-23 DIAGNOSIS — I10 Essential (primary) hypertension: Secondary | ICD-10-CM | POA: Diagnosis not present

## 2019-11-01 ENCOUNTER — Encounter: Payer: Self-pay | Admitting: Internal Medicine

## 2019-11-01 ENCOUNTER — Other Ambulatory Visit: Payer: Self-pay

## 2019-11-01 ENCOUNTER — Non-Acute Institutional Stay (INDEPENDENT_AMBULATORY_CARE_PROVIDER_SITE_OTHER): Payer: Managed Care, Other (non HMO) | Admitting: Internal Medicine

## 2019-11-01 VITALS — BP 138/78 | HR 87 | Temp 97.5°F | Ht 66.0 in | Wt 182.0 lb

## 2019-11-01 DIAGNOSIS — M8949 Other hypertrophic osteoarthropathy, multiple sites: Secondary | ICD-10-CM

## 2019-11-01 DIAGNOSIS — M159 Polyosteoarthritis, unspecified: Secondary | ICD-10-CM

## 2019-11-01 DIAGNOSIS — I10 Essential (primary) hypertension: Secondary | ICD-10-CM

## 2019-11-01 DIAGNOSIS — C50912 Malignant neoplasm of unspecified site of left female breast: Secondary | ICD-10-CM

## 2019-11-01 DIAGNOSIS — C50911 Malignant neoplasm of unspecified site of right female breast: Secondary | ICD-10-CM

## 2019-11-01 DIAGNOSIS — M1731 Unilateral post-traumatic osteoarthritis, right knee: Secondary | ICD-10-CM

## 2019-11-01 DIAGNOSIS — M150 Primary generalized (osteo)arthritis: Secondary | ICD-10-CM

## 2019-11-01 MED ORDER — METHYLPREDNISOLONE ACETATE 40 MG/ML IJ SUSP
40.0000 mg | Freq: Once | INTRAMUSCULAR | Status: AC
  Administered 2019-11-01: 40 mg via INTRA_ARTICULAR

## 2019-11-01 NOTE — Progress Notes (Signed)
Location:   [place]   Place of Service:  Clinic (12)  Provider: Jashira Cotugno L. Mariea Clonts, D.O., C.M.D.  Code Status: DNR Goals of Care:  Advanced Directives 11/01/2019  Does Patient Have a Medical Advance Directive? Yes  Type of Paramedic of [place];Out of facility DNR (pink MOST or yellow form)  Does patient want to make changes to medical advance directive? No - Patient declined  Copy of [place] in Chart? -  Pre-existing out of facility DNR order (yellow form or pink MOST form) Yellow form placed in chart (order not valid for inpatient use)   Chief Complaint  Patient presents with  . Acute Visit    Knee pain, knee injection    HPI: Patient is a 74 y.o. female seen today for an acute visit for knee pain, requesting injection.  Has known OA.    Received records from primary care and specialty offices now.    Had a shot in right knee [place] in early November.  Sometimes interferes with her activity. Does all of Robin's exercises which don't hurt, but walking will hurt and sometimes it bothers her overnight.  Does not want it to start catching like it did in Nov.  She is going to visit family next week when driving (driving has made it hurt).  Eldridge Abrahams is just 26 mos.    She has been to Dwana Melena, [place] at [facility] for trigger finger of right hand.  Will soon need ring finger injected also.  Injection did work.    She has a history of breast cancer with bilateral mastectomies and reconstruction and has been on hormone therapies which have been challenging to tolerate.  All of her records have now come in per what she is saying. She does need an oncologist here.  She is back on exemestane.  Is tolerating it pretty well.  It's more tolerable than the others.  Gets achy and she says her hair is thinning, but ok outside of those.   Marland Kitchen   Past Medical History:  Diagnosis Date  . Arthritis    Per [facility] New Patient Packet   . Bilateral malignant neoplasm of  breast in female [facility]) 10/18/2018  . Breast cancer [facility])    Per [place] New Patient Packet   . Essential hypertension 10/25/2015  . High blood pressure    Per [place] Patient Packet   . High cholesterol    Per [place] New Patient Packet   . Hyperlipidemia LDL goal <130 10/25/2015  . Prediabetes 11/19/2017    Past Surgical History:  Procedure Laterality Date  . CATARACT EXTRACTION  2018   Per [place] New Patient Packet, [place]     x2  . COLONOSCOPY  2018   Per [place] new patient packet, Dr.Locker  . MASTECTOMY Bilateral 02/02/2019   Per [place] New Patient Packet, DrMichele Blackwood and Dr.Colon   . MENISCECTOMY Right 2009  . MENISCUS REPAIR  2004   Per [place] Patient Packet, Dr. Milagros Reap  . TONSILLECTOMY  1951   Per [facility] New Patient Packet    No Known Allergies  Outpatient Encounter Medications as of 11/01/2019  Medication Sig  . Azelastine-Fluticasone (DYMISTA) 137-50 MCG/ACT SUSP daily.  . Cholecalciferol (VITAMIN D3) 50 MCG (2000 UT) capsule Take 2,000 Units by mouth daily.  . diclofenac sodium (VOLTAREN) 1 % GEL Apply 2 g topically 2 (two) times daily.  Marland Kitchen esomeprazole (NEXIUM) 20 MG capsule Take 20 mg  by mouth daily at 12 noon.  Marland Kitchen exemestane (AROMASIN) 25 MG tablet Take by mouth.  . Glucosamine 500 MG CAPS Take by mouth daily.  Vladimir Faster Glycol-Propyl Glycol (SYSTANE) 0.4-0.3 % SOLN Apply to eye daily.  . psyllium (METAMUCIL) 58.6 % packet Take 1 packet by mouth daily.  . candesartan-hydrochlorothiazide (ATACAND HCT) 32-12.5 MG tablet Take 1 tablet by mouth daily.   No facility-administered encounter medications on file as of 11/01/2019.    Review of Systems:  Review of Systems  Constitutional: Negative for chills, fever and malaise/fatigue.  HENT: Negative for congestion, hearing loss and sore throat.   Eyes: Negative for blurred vision.       Glasses  Respiratory: Negative for cough and shortness of breath.   Cardiovascular: Negative for chest  pain, palpitations and leg swelling.  Gastrointestinal: Negative for abdominal pain, blood in stool, constipation and melena.  Genitourinary: Negative for dysuria, frequency and urgency.  Musculoskeletal: Positive for joint pain. Negative for falls.  Skin: Negative for itching and rash.  Neurological: Negative for dizziness, tingling, sensory change and loss of consciousness.  Endo/Heme/Allergies: Does not bruise/bleed easily.  Psychiatric/Behavioral: Negative for depression and memory loss. The patient is not nervous/anxious and does not have insomnia.     Health Maintenance  Topic Date Due  . Hepatitis C Screening  07-03-1946  . MAMMOGRAM  09/16/2020  . COLONOSCOPY  03/15/2023  . TETANUS/TDAP  08/27/2024  . INFLUENZA VACCINE  Completed  . DEXA SCAN  Completed  . PNA vac Low Risk Adult  Completed    Physical Exam: Vitals:   11/01/19 1436  BP: 138/78  Pulse: 87  Temp: (!) 97.5 F (36.4 C)  SpO2: 97%  Weight: 182 lb (82.6 kg)  Height: 5\' 6"  (1.676 m)   Body mass index is 29.38 kg/m. Physical Exam Vitals reviewed.  Constitutional:      General: She is not in acute distress.    Appearance: Normal appearance. She is not toxic-appearing.  HENT:     Head: Normocephalic and atraumatic.  Eyes:     Extraocular Movements: Extraocular movements intact.     Pupils: Pupils are equal, round, and reactive to light.  Cardiovascular:     Rate and Rhythm: Normal rate and regular rhythm.     Heart sounds: No murmur.  Pulmonary:     Effort: Pulmonary effort is normal.     Breath sounds: Normal breath sounds.  Abdominal:     General: Bowel sounds are normal.  Musculoskeletal:        General: Tenderness, deformity and signs of injury present.     Right lower leg: No edema.     Left lower leg: No edema.     Comments: Right knee had prior meniscal tear and some deformity on lateral aspect that is tender since; no effusion or warmth  Neurological:     General: No focal deficit  present.     Mental Status: She is alert and oriented to person, place, and time.  Psychiatric:        Mood and Affect: Mood normal.        Behavior: Behavior normal.        Thought Content: Thought content normal.        Judgment: Judgment normal.     Labs reviewed: Basic Metabolic Panel: Recent Labs    01/14/19 0000 09/14/19 0500  NA 136* 141  K 4.3 4.5  CL  --  101  CO2  --  27*  BUN  17 20  CREATININE 0.7 0.6  CALCIUM  --  10.5   Liver Function Tests: Recent Labs    01/14/19 0000 09/14/19 0500  AST 23  --   ALT 25  --   ALKPHOS 83  --   ALBUMIN  --  5.0   No results for input(s): LIPASE, AMYLASE in the last 8760 hours. No results for input(s): AMMONIA in the last 8760 hours. CBC: Recent Labs    01/14/19 0000 09/14/19 0500  WBC 6.3 5.9  HGB 14.3 14.8  HCT 41 43  PLT 272 239   Lipid Panel: Recent Labs    09/14/19 0500  CHOL 212*  LDLCALC 106  TRIG 211*   Lab Results  Component Value Date   HGBA1C 6.0 09/14/2019    Procedures since last visit: DG Bone Density  Result Date: 10/17/2019 EXAM: DUAL X-RAY ABSORPTIOMETRY (DXA) FOR BONE MINERAL DENSITY IMPRESSION: Referring Physician:  Quail Your patient completed a BMD test using Lunar IDXA DXA system ( analysis version: 16 ) manufactured by [employer]. Technologist: AW PATIENT: Name: Kit, Sleiman Patient ID: [id number] Birth Date: Mar 24, 1946 Height: 66.5 in. Sex: Female Measured: 10/17/2019 Weight: 180.2 lbs. Indications: Advanced Age, Aromasin, Breast Cancer History, Caucasian, Estrogen Deficient, Long term use of aromatase inhibitor, osteoarthiritis, Postmenopausal Fractures: None Treatments: Vitamin D (E933.5) ASSESSMENT: The BMD measured at Femur Neck Left is 0.982 g/cm2 with a T-score of -0.4. This patient is considered normal according to [facility]) criteria. The scan quality is good. Lumbar spine was not utilized due to advanced degenerative changes. Site Region Measured  Date Measured Age YA BMD Significant CHANGE T-score DualFemur Neck Left 10/17/2019 73.4 -0.4 0.982 g/cm2 DualFemur Total Mean 10/17/2019 73.4 0.5 1.077 g/cm2 Left Forearm Radius 33% 10/17/2019 73.4 0.5 0.928 g/cm2 World Health Organization [facility]) criteria for post-menopausal, Caucasian Women: Normal       T-score at or above -1 SD Osteopenia   T-score between -1 and -2.5 SD Osteoporosis T-score at or below -2.5 SD RECOMMENDATION: 1. All patients should optimize calcium and vitamin D intake. 2. Consider FDA approved medical therapies in postmenopausal women and men aged 32 years and older, based on the following: a. A hip or vertebral (clinical or morphometric) fracture b. T- score < or = -2.5 at the femoral neck or spine after appropriate evaluation to exclude secondary causes c. Low bone mass (T-score between -1.0 and -2.5 at the femoral neck or spine) and a 10 year probability of a hip fracture > or = 3% or a 10 year probability of a major osteoporosis-related fracture > or = 20% based on the [place]-adapted WHO algorithm d. Clinician judgment and/or patient preferences may indicate treatment for people with 10-year fracture probabilities above or below these levels FOLLOW-UP: Patients with diagnosis of osteoporosis or at high risk for fracture should have regular bone mineral density tests. For patients eligible for Medicare, routine testing is allowed once every 2 years. The testing frequency can be increased to one year for patients who have rapidly progressing disease, those who are receiving or discontinuing medical therapy to restore bone mass, or have additional risk factors. I have reviewed this report and agree with the above findings. [facility] Radiology Electronically Signed   By: Lowella Grip III M.D.   On: 10/17/2019 09:48    Assessment/Plan 1. Primary osteoarthritis involving multiple joints -knees particularly bothersome -made worse by hormone therapies -right knee worse lately and requesting  injection - methylPREDNISolone acetate (DEPO-MEDROL) injection 40 mg  2. Bilateral malignant neoplasm of breast in female, unspecified estrogen receptor status, unspecified site of breast [facility]) - s/p bilateral mastectomies, reconstruction and hormone therapy in [place] -continue exemestane -some info in care everywhere, but we have received paper records to [facility]  - Ambulatory referral to Hematology / Oncology, Dr. Burr Medico,  3. Post-traumatic osteoarthritis of right knee -right medial knee was cleansed x 2 with betadine, sprayed with topical bupivicaine spray, then injected with 1cc/40mg  depomedrol and 2cc of 1% lidocaine with some immediate relief of her discomfort -area then cleansed with alcohol swab and bandaid applied  4. Essential hypertension -bp satisfactory here today and at doctor's appt -continue same regimen and increased exercise and monitor  Labs/tests ordered:  No new added today Next appt:  03/20/2020  Icela Glymph L. Aiyanah Kalama, D.O. [employer] [address], [place] [address] Cell Phone (Mon-Fri 8am-5pm):  [phone] On Call:  [phone] & follow prompts after 5pm & weekends Office Phone:  [phone] Office Fax:  [phone]

## 2019-11-06 ENCOUNTER — Telehealth: Payer: Self-pay | Admitting: Hematology

## 2019-11-06 NOTE — Telephone Encounter (Signed)
Scheduled per referral. Called and spoke with pt, confirmed 4/5 appt. Pt aware to arrive 15-30 mins early and to bring insurance card with photo ID

## 2019-11-16 NOTE — Progress Notes (Addendum)
[place]   [phone] Fax:([phone]) [place] Note   Patient Care Team: Victoria Curry, DO as PCP - General (Geriatric Medicine)  Date of Service:  11/27/2019   CHIEF COMPLAINTS/PURPOSE OF CONSULTATION:  Bilateral breast cancer    REFERRING PHYSICIAN:  Dr. Iva Wolfe   Oncology History Overview Note  Cancer Staging Cancer of overlapping sites of left breast [facility]) Staging form: Breast, AJCC 8th Edition - Clinical stage from 09/27/2018: Stage IA (cT1b, cN0, cM0, G2, ER+, PR+, HER2-) - Signed by Victoria Merle, MD on 12/07/2019 - Pathologic stage from 02/02/2019: Stage IA (pT1b, pN0, cM0, G2, ER+, PR+, HER2-) - Signed by Victoria Merle, MD on 12/07/2019     Cancer of overlapping sites of left breast (Victoria Wolfe)  10/04/2018 Mammogram   10/04/18 B/l mammogram - multifocal malignancy left breast with one prominent axillary node. Lesions are seperated by at least 3cm, but additional disease is not excluded indeterminate right enhancement-additional spot mammographic imaging and [place] advised to locate for sampling. Birads 6.   10/07/2018 Mammogram   10/07/18 - mammogram suspicious of malignancy. the nodule in the right breast middle depth lateral region seen on the craniocausal view only is at alow suspicion for malignancy. a stereotactic biopsy is reocmnended. Birads 4a low suspicions for malignancy    10/07/2018 Breast US   10/07/18 B/L [place] - known biopsy proven malignancy. The mass in the lift breast at 11: 00 positoin psterior depth is a known biopsy positive for malignancy. Birads 6 known malignancy    10/18/2018 Initial Diagnosis   Bilateral malignant neoplasm of breast in female [facility])   10/19/2018 Initial Biopsy   09/27/2018 Left breast biopsy  -11:00. 5cm from nipple: invasive ductal carcinoma, ER96%+, PR 98%+, HER2-, Ki67 25% -Left breast, upper lesion: invasive ductal carcinoma, ER96%+, PR 98%+, HER2-, Ki67 20% 10/19/18 Right breast core biopsy -Bening  fibroadipose breast tissue with focal usual ductal hyperplasia.     2020 Genetic Testing   Genetc testing negative    11/2018 - 01/2019 Neo-Adjuvant Anti-estrogen oral therapy   Neoadjuvant Anastrozole once daily for 6-8 weeks and had great response to treatment. Did not tolerate well.    02/02/2019 Surgery   B/l total mastectomy with Sentinel Node Biopsy by Dr. Pasty Wolfe 12/08/18 PATHOLOGY: 1. 2 left axillary sentinel lymph node were negative for malignant cells. 2. Left breast mastectomy: Invasive and in situ ductal carcinoma, 37m, associated with metal clip and prior biopsy site change, at the 11:00-12:00 position; a second metal clip is present at the 8:00 to 9 o'clock position, no evidence of malignancy 3. Right breast mastectomy: Benign breast tissue   02/2019 -  Anti-estrogen oral therapy   Letrozole once daily for 2 months since the end of 02/2019-04/2019. Did not tolerate well and was switched to Exemestane once daily in 04/2019.    06/30/2019 Surgery   B/l Breast Reconstruction with Dr. CGreig Castilla11/6/20   10/17/2019 Imaging   DEXA ASSESSMENT: The BMD measured at Femur Neck Left is 0.982 g/cm2 with a T-score of -0.4. This patient is considered normal according to [facility] criteria.      HISTORY OF PRESENTING ILLNESS:  [name]74y.o. female is a here because of bilateral breast cancer. The patient was referred by Dr.Wagmiller. The patient presents to the clinic today alone.   She was being seen by Dr. WIva Boopwas her Medical Oncologist and surgeon Dr. BPasty Wolfe She had stage I breast cancer found by mammogram. She  did not feel the mass herself. She had an abnormal benign mammogram in her past. She had 2 malignant masses in her left breast and benign mass in her right mass. She was treated with neoadjuvant anastrozole for 6-8 weeks and had great response to treatment before b/l mastectomy surgery in 01/2019. Because she did not tolerate anastrozole she was  placed on Letrozole after surgery end of 02/2019 for 2 months. She did not tolerate this either and is currently on exemestane and tolerating it much better. She did have reconstruction surgery in late 2020. She notes she has recovered well from surgeries. She did not require Radiation and did not have chemotherapy. She is not sure if she had oncotype test. She notes she was on Prempro hormonal replacement for 20 years which she attributes to the cause of her breast cancer. She notes she has both her COVID19 vaccinations well.   Socially, she has 2 adult children and she is divorces. 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 Her sister lives in [place] with her husband and she currently lives there as well. She notes she has nightly wine. She quit smoking many years ago.   They have a PMHx of HTN, Arthritis. She had right knee repair surgery, tonsillectomy, cataract surgery and tubal ligation. She still has right knee pain intermittently. She received shots for this.  Her mother had colon cancer at 67 and lived through it.    GYN HISTORY  Menarchal: 74-15 LMP: age [age] HRT: On Prempro for 20 years  GP2:    REVIEW OF SYSTEMS:    Constitutional: Denies fevers, chills or abnormal night sweats Eyes: Denies blurriness of vision, double vision or watery eyes Ears, nose, mouth, throat, and face: Denies mucositis or sore throat Respiratory: Denies cough, dyspnea or wheezes Cardiovascular: Denies palpitation, chest discomfort or lower extremity swelling Gastrointestinal:  Denies nausea, heartburn or change in bowel habits Skin: Denies abnormal skin rashes Lymphatics: Denies new lymphadenopathy or easy bruising Neurological:Denies numbness, tingling or new weaknesses Behavioral/Psych: Mood is stable, no new changes  All other systems were reviewed with the patient and are negative.   MEDICAL HISTORY:  Past  Medical History:  Diagnosis Date  . Arthritis    Per [facility] New Patient Packet   . Bilateral malignant neoplasm of breast in female [facility]) 10/18/2018  . Breast cancer [facility])    Per [place] New Patient Packet   . Essential hypertension 10/25/2015  . High blood pressure    Per [place] Patient Packet   . High cholesterol    Per  New Patient Packet   . Hyperlipidemia LDL goal <130 10/25/2015  . Prediabetes 11/19/2017    SURGICAL HISTORY: Past Surgical History:  Procedure Laterality Date  . CATARACT EXTRACTION  2018   Per [place] New Patient Packet, [place]     x2  . COLONOSCOPY  2018   Per [place] new patient packet, Dr.Locker  . MASTECTOMY Bilateral 02/02/2019   Per [place] New Patient Packet, DrMichele Blackwood and Dr.Colon   . MENISCECTOMY Right 2009  . MENISCUS REPAIR  2004   Per [place] Patient Packet, Dr. Milagros Reap  . TONSILLECTOMY  1951   Per [facility] New Patient Packet    SOCIAL HISTORY: Social History   Socioeconomic History  . Marital status: Divorced    Spouse name: Not on file  . Number of children: 2  .  Years of education: Not on file  . Highest education level: Not on file  Occupational History  . Not on file  Tobacco Use  . Smoking status: Former Smoker    Years: 25.00    Types: Cigarettes    Quit date: 1995    Years since quitting: 26.2  . Smokeless tobacco: Never Used  Substance and Sexual Activity  . Alcohol use: Yes    Alcohol/week: 7.0 standard drinks    Types: 7 Glasses of wine per week  . Drug use: Not Currently  . Sexual activity: Not Currently  Other Topics Concern  . Not on file  Social History Narrative   Diet: Healthy      Caffeine: 1 cup of coffee in the morning       Married, if yes what year: Divorced, married in [place] you live in a house, apartment, assisted living, [place], trailer, ect: Apartment, more than 1 stories, 1 person.       Pets: No      Current/Past profession: Masters, Education administrator       Exercise: Yes, TV class 1 hour M-F         Living Will: Yes   DNR: No   POA/HPOA: Yes      Functional Status:   Do you have difficulty bathing or dressing yourself? Did not answer   Do you have difficulty preparing food or eating? Did not answer   Do you have difficulty managing your medications? Did not answer   Do you have difficulty managing your finances? Did not answer   Do you have difficulty affording your medications? Did not answer   Social Determinants of Health   Financial Resource Strain:   . Difficulty of Paying Living Expenses:   Food Insecurity:   . Worried About Charity fundraiser in the Last Year:   . Arboriculturist in the Last Year:   Transportation Needs:   . Film/video editor (Medical):   Marland Kitchen Lack of Transportation (Non-Medical):   Physical Activity:   . Days of Exercise per Week:   . Minutes of Exercise per Session:   Stress:   . Feeling of Stress :   Social Connections:   . Frequency of Communication with Friends and Family:   . Frequency of Social Gatherings with Friends and Family:   . Attends Religious Services:   . Active Member of Clubs or Organizations:   . Attends Archivist Meetings:   Marland Kitchen Marital Status:   Intimate Partner Violence:   . Fear of Current or Ex-Partner:   . Emotionally Abused:   Marland Kitchen Physically Abused:   . Sexually Abused:     FAMILY HISTORY: Family History  Problem Relation Age of Onset  . Alzheimer's disease Mother   . Colon cancer Mother 59  . Ulcers Father   . Parkinsonism Father   . Osteoporosis Sister     ALLERGIES:  has No Known Allergies.  MEDICATIONS:  Current Outpatient Medications  Medication Sig Dispense Refill  . Azelastine-Fluticasone (DYMISTA) 137-50 MCG/ACT SUSP Place 1 spray into both nostrils daily. 23 g 0  . candesartan-hydrochlorothiazide (ATACAND HCT) 32-12.5 MG tablet Take 1 tablet by mouth daily.    . Cholecalciferol (VITAMIN D3) 50 MCG (2000 UT) capsule Take 2,000  Units by mouth daily.    . diclofenac sodium (VOLTAREN) 1 % GEL Apply 2 g topically 2 (two) times daily.    Marland Kitchen esomeprazole (  NEXIUM) 20 MG capsule Take 20 mg by mouth daily at 12 noon.    Marland Kitchen exemestane (AROMASIN) 25 MG tablet Take 1 tablet (25 mg total) by mouth daily. 90 tablet 3  . Glucosamine 500 MG CAPS Take by mouth daily.    Vladimir Faster Glycol-Propyl Glycol (SYSTANE) 0.4-0.3 % SOLN Apply to eye daily.    . psyllium (METAMUCIL) 58.6 % packet Take 1 packet by mouth daily.    . rosuvastatin (CRESTOR) 10 MG tablet Take 1 tablet (10 mg total) by mouth daily. 90 tablet 1  . Rosuvastatin Calcium 10 MG CPSP      No current facility-administered medications for this visit.    PHYSICAL EXAMINATION: ECOG PERFORMANCE STATUS: 0 - Asymptomatic  Vitals:   11/27/19 1505  BP: (!) 158/88  Pulse: 92  Resp: 18  Temp: 98 F (36.7 C)  SpO2: 97%   Filed Weights   11/27/19 1505  Weight: 182 lb 14.4 oz (83 kg)    GENERAL:alert, no distress and comfortable SKIN: skin color, texture, turgor are normal, no rashes or significant lesions (+) Mild right breast skin erythema. EYES: normal, Conjunctiva are pink and non-injected, sclera clear  NECK: supple, thyroid normal size, non-tender, without nodularity LYMPH:  no palpable lymphadenopathy in the cervical, axillary  LUNGS: clear to auscultation and percussion with normal breathing effort HEART: regular rate & rhythm and no murmurs and no lower extremity edema ABDOMEN:abdomen soft, non-tender and normal bowel sounds Musculoskeletal:no cyanosis of digits and no clubbing  NEURO: alert & oriented x 3 with fluent speech, no focal motor/sensory deficits BREAST: s/p b/l mastectomy and reconstruction: Surgical incisions healed well with mild scar tissue.  No palpable mass, nodules or adenopathy bilaterally. Breast exam benign.  LABORATORY DATA:  I have reviewed the data as listed CBC Latest Ref Rng & Units 09/14/2019 01/14/2019 11/16/2017  WBC - 5.9 6.3 6.4    Hemoglobin 12.0 - 16.0 14.8 14.3 13.8  Hematocrit 36 - 46 43 41 41  Platelets 150 - 399 239 272 242    CMP Latest Ref Rng & Units 09/14/2019 01/14/2019 11/16/2017  BUN 4 - _0 Creatinine 0.5 - 1.1 0.6 0.7 0.6  Sodium 137 - 147 141 136(A) 138  Potassium 3.4 - 5.3 4.5 4.3 4.2  Chloride 99 - 108 101 - -  CO2 13 - 22 27(A) - -  Calcium 8.7 - 10.7 10.5 - -  Alkaline Phos 25 - 125 - 83 62  AST 13 - 35 - 23 26  ALT 7 - 35 - 25 34     RADIOGRAPHIC STUDIES: I have personally reviewed the radiological images as listed and agreed with the findings in the report. No results found.  ASSESSMENT & PLAN:  Victoria Wolfe is a 74 y.o. Caucasian female with a history of Arthritis, HTN, HLD.    1. Left multifocal breast Cancer, stage IA (mpT1bN0), ER+/PR+/HER2- -She was diagnosed with stage 1 breast cancer and treated in [place] with neoadjuvant anastrozole for 6-8 weeks, b/l mastectomy and adjuvant AI.  -She did not tolerate Anastrozole or Letrozole due to arthralgia. She is currently on Exemestane which she tolerates well with mild joint pain and hot flashes. Will continue for at least 5 years.  We discussed the benefit of extended adjuvant AI for 7 to 10 years -I will review her surgical pathology report when I receive it.  -We also discussed the breast cancer surveillance after her surgery. She will continue annual, self exam, and  a routine office visit with lab and exam with [place]. No need for mammograms given b/l mastectomy.  We discussed red flags for breast cancer surveillance, especially on explained bone pain, weight loss, abdominal pain, etc.  She knows to call me if she has any concerns. -She is clinically doing well. Physical exam benign. There is no clinical concern for recurrence  -Continue Exemestane  -F/u in 6 months. She will f/u with her PCP in interim.    2. Comorbidities: HTN, Arthritis with right knee pain, HLD.  -On Crestor, Candesartan/HCTZ -She receives shots in her  right knee. She also notes trigger finger.    3. Bone Health  -Her 09/2019 DEXA was normal (-0.4 T-score).  -Given she is on AI which can weaken her bone will repeat every 2 years.    4. Cancer Screenings  -She is due to colonoscopy in 02/2021 due to polyps on prior procedure.  -I recommend she continue Pap Smears with her Gyn.    PLAN:  -Continue Exemestane, refilled today  -F/u in 6 months, she will do lab with PCP Dr. Mariea Clonts in June    No orders of the defined types were placed in this encounter.   All questions were answered. The patient knows to call the clinic with any problems, questions or concerns. The total time spent in the appointment was 45 minutes.     Victoria Merle, MD 11/27/2019 4:59 PM  I, Joslyn Devon, am acting as scribe for Victoria Merle, MD.   I have reviewed the above documentation for accuracy and completeness, and I agree with the above.   Addendum  I have received her outside pathology report and updated her oncology staging and history.  Victoria Wolfe  12/07/2019

## 2019-11-27 ENCOUNTER — Other Ambulatory Visit: Payer: Self-pay

## 2019-11-27 ENCOUNTER — Inpatient Hospital Stay: Payer: Managed Care, Other (non HMO) | Attending: Hematology | Admitting: Hematology

## 2019-11-27 ENCOUNTER — Encounter: Payer: Self-pay | Admitting: Hematology

## 2019-11-27 VITALS — BP 158/88 | HR 92 | Temp 98.0°F | Resp 18 | Ht 66.0 in | Wt 182.9 lb

## 2019-11-27 DIAGNOSIS — E785 Hyperlipidemia, unspecified: Secondary | ICD-10-CM | POA: Diagnosis not present

## 2019-11-27 DIAGNOSIS — Z79899 Other long term (current) drug therapy: Secondary | ICD-10-CM | POA: Insufficient documentation

## 2019-11-27 DIAGNOSIS — I10 Essential (primary) hypertension: Secondary | ICD-10-CM | POA: Diagnosis not present

## 2019-11-27 DIAGNOSIS — E7800 Pure hypercholesterolemia, unspecified: Secondary | ICD-10-CM | POA: Diagnosis not present

## 2019-11-27 DIAGNOSIS — Z87891 Personal history of nicotine dependence: Secondary | ICD-10-CM | POA: Insufficient documentation

## 2019-11-27 DIAGNOSIS — Z79811 Long term (current) use of aromatase inhibitors: Secondary | ICD-10-CM | POA: Insufficient documentation

## 2019-11-27 DIAGNOSIS — Z800 Family history of malignant neoplasm of digestive organs: Secondary | ICD-10-CM | POA: Insufficient documentation

## 2019-11-27 DIAGNOSIS — Z170 Estrogen receptor positive status [ER+]: Secondary | ICD-10-CM | POA: Insufficient documentation

## 2019-11-27 DIAGNOSIS — C50812 Malignant neoplasm of overlapping sites of left female breast: Secondary | ICD-10-CM

## 2019-11-27 DIAGNOSIS — C50912 Malignant neoplasm of unspecified site of left female breast: Secondary | ICD-10-CM | POA: Insufficient documentation

## 2019-11-27 DIAGNOSIS — C50911 Malignant neoplasm of unspecified site of right female breast: Secondary | ICD-10-CM | POA: Insufficient documentation

## 2019-11-27 DIAGNOSIS — Z9013 Acquired absence of bilateral breasts and nipples: Secondary | ICD-10-CM | POA: Insufficient documentation

## 2019-11-27 MED ORDER — EXEMESTANE 25 MG PO TABS: 25.0000 mg | ORAL_TABLET | Freq: Every day | ORAL | 3 refills | Status: DC

## 2019-11-27 MED ORDER — ROSUVASTATIN CALCIUM 10 MG PO TABS: 10.0000 mg | ORAL_TABLET | Freq: Every day | ORAL | 1 refills | Status: DC

## 2019-11-27 MED ORDER — AZELASTINE-FLUTICASONE 137-50 MCG/ACT NA SUSP: 1.0000 | Freq: Every day | NASAL | 0 refills | Status: DC

## 2019-11-27 NOTE — Telephone Encounter (Signed)
Appears that crestor was discontinued in error.  Ok to refill both crestor and fluticasone (otc flonase nasal spray)

## 2019-11-27 NOTE — Telephone Encounter (Signed)
Patient called and request refill on medications "Crestor" and Fluticasone". Patient had 'Crestor" discontinued by Princess Bruins, MD. Please Advise.

## 2019-11-27 NOTE — Telephone Encounter (Signed)
Medications refilled

## 2019-12-01 ENCOUNTER — Telehealth: Payer: Self-pay | Admitting: Hematology

## 2019-12-01 NOTE — Telephone Encounter (Signed)
Scheduled per 04/05 los, patient has been called and notified of upcoming appointments.

## 2019-12-08 ENCOUNTER — Ambulatory Visit: Payer: Managed Care, Other (non HMO) | Admitting: Orthopaedic Surgery

## 2019-12-08 ENCOUNTER — Other Ambulatory Visit: Payer: Self-pay

## 2019-12-08 ENCOUNTER — Encounter: Payer: Self-pay | Admitting: Orthopaedic Surgery

## 2019-12-08 VITALS — Ht 66.0 in | Wt 182.0 lb

## 2019-12-08 DIAGNOSIS — M65341 Trigger finger, right ring finger: Secondary | ICD-10-CM | POA: Insufficient documentation

## 2019-12-08 MED ORDER — BUPIVACAINE HCL 0.5 % IJ SOLN
0.3300 mL | INTRAMUSCULAR | Status: AC | PRN
  Administered 2019-12-08: 11:00:00 .33 mL

## 2019-12-08 MED ORDER — METHYLPREDNISOLONE ACETATE 40 MG/ML IJ SUSP
13.3300 mg | INTRAMUSCULAR | Status: AC | PRN
  Administered 2019-12-08: 13.33 mg

## 2019-12-08 MED ORDER — LIDOCAINE HCL 1 % IJ SOLN
0.3000 mL | INTRAMUSCULAR | Status: AC | PRN
  Administered 2019-12-08: .3 mL

## 2019-12-08 NOTE — Progress Notes (Signed)
Office Visit Note   Patient: Victoria Wolfe           Date of Birth: 21-Jun-1946           MRN: [id number] Visit Date: 12/08/2019              Requested by: Gayland Curry, DO [address] [place],  [place] [address] PCP: Gayland Curry, DO   Assessment & Plan: Visit Diagnoses:  1. Trigger finger, right ring finger     Plan: Impression is symptomatic right ring trigger finger.  This was injected today in the office.  She tolerated this well.  Follow-up as needed.  Follow-Up Instructions: Return if symptoms worsen or fail to improve.   Orders:  No orders of the defined types were placed in this encounter.  No orders of the defined types were placed in this encounter.     Procedures: Hand/UE Inj: R ring A1 for trigger finger on 12/08/2019 11:25 AM Indications: pain Details: 25 G needle Medications: 0.3 mL lidocaine 1 %; 0.33 mL bupivacaine 0.5 %; 13.33 mg methylPREDNISolone acetate 40 MG/ML Outcome: tolerated well, no immediate complications Consent was given by the patient. Patient was prepped and draped in the usual sterile fashion.       Clinical Data: No additional findings.   Subjective: Chief Complaint  Patient presents with  . Right Hand - Pain    Victoria Wolfe returns today for worsening right ring trigger finger that started in November 2020.  We injected her right trigger thumb in February and it has helped tremendously.  She would like an injection for the right ring trigger finger today.  No changes in medical history.  Denies diabetes.   Review of Systems   Objective: Vital Signs: Ht 5\' 6"  (1.676 m)   Wt 182 lb (82.6 kg)   BMI 29.38 kg/m   Physical Exam  Ortho Exam Right ring finger is consistent with a trigger finger.  Tenderness in the palm. Specialty Comments:  No specialty comments available.  Imaging: No results found.   PMFS History: Patient Active Problem List   Diagnosis Date Noted  . Trigger finger, right ring finger 12/08/2019   . History of colon polyps 05/10/2019  . Diverticulosis 05/10/2019  . 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 Cancer of overlapping sites of left breast ([place]) 10/18/2018  . Prediabetes 11/19/2017  . Essential hypertension 10/25/2015  . Hyperlipidemia LDL goal <130 10/25/2015   Past Medical History:  Diagnosis Date  . Arthritis    Per [facility] New Patient Packet   . Bilateral malignant neoplasm of breast in female [facility]) 10/18/2018  . Breast cancer [facility])    Per [place] New Patient Packet   . Essential hypertension 10/25/2015  . High blood pressure    Per [place] Patient Packet   . High cholesterol    Per [place] New Patient Packet   . Hyperlipidemia LDL goal <130 10/25/2015  . Prediabetes 11/19/2017    Family History  Problem Relation Age of Onset  . Alzheimer's disease Mother   . Colon cancer Mother 69  . Ulcers Father   . Parkinsonism Father   . Osteoporosis Sister     Past Surgical History:  Procedure Laterality Date  . CATARACT EXTRACTION  2018   Per [place] New Patient Packet, [place]     x2  . COLONOSCOPY  2018   Per [place] new patient packet, Dr.Locker  .  MASTECTOMY Bilateral 02/02/2019   Per [place] New Patient Packet, DrMichele Blackwood and Dr.Colon   . MENISCECTOMY Right 2009  . MENISCUS REPAIR  2004   Per [place] Patient Packet, Dr. Milagros Reap  . TONSILLECTOMY  1951   Per [facility] New Patient Packet   Social History   Occupational History  . Not on file  Tobacco Use  . Smoking status: Former Smoker    Years: 25.00    Types: Cigarettes    Quit date: 1995    Years since quitting: 26.3  . Smokeless tobacco: Never Used  Substance and Sexual Activity  . Alcohol use: Yes    Alcohol/week: 7.0 standard drinks    Types: 7 Glasses of wine per week  . Drug use: Not Currently  . Sexual activity: Not Currently

## 2020-01-07 ENCOUNTER — Other Ambulatory Visit: Payer: Self-pay | Admitting: Internal Medicine

## 2020-01-23 ENCOUNTER — Telehealth: Payer: Self-pay

## 2020-01-23 NOTE — Telephone Encounter (Signed)
Called patient and let her know that Dr. Mariea Clonts did not feel that testing was needed.

## 2020-01-23 NOTE — Telephone Encounter (Signed)
The patient has a productive cough that seems to be responding to Mucinex.  She is asking if she should have COVID testing. She has had both vaccinations and has no other symptoms of COVID - specifically no fever, SOB, loss of taste/smell.

## 2020-01-23 NOTE — Telephone Encounter (Signed)
I don't think testing is needed considering her vaccination status.

## 2020-02-02 ENCOUNTER — Encounter: Payer: Self-pay | Admitting: Hematology

## 2020-03-14 LAB — LIPID PANEL
Cholesterol: 199 (ref 0–200)
HDL: 50 (ref 35–70)
LDL Cholesterol: 111
Triglycerides: 190 — AB (ref 40–160)

## 2020-03-14 LAB — HEMOGLOBIN A1C: Hemoglobin A1C: 6.8

## 2020-03-14 LAB — VITAMIN B12: Vitamin B-12: 271

## 2020-03-20 ENCOUNTER — Encounter: Payer: Self-pay | Admitting: Internal Medicine

## 2020-03-20 ENCOUNTER — Other Ambulatory Visit: Payer: Self-pay

## 2020-03-20 ENCOUNTER — Non-Acute Institutional Stay: Payer: Managed Care, Other (non HMO) | Admitting: Internal Medicine

## 2020-03-20 VITALS — BP 140/92 | HR 79 | Temp 97.7°F | Ht 66.0 in | Wt 185.0 lb

## 2020-03-20 DIAGNOSIS — M159 Polyosteoarthritis, unspecified: Secondary | ICD-10-CM

## 2020-03-20 DIAGNOSIS — T451X5A Adverse effect of antineoplastic and immunosuppressive drugs, initial encounter: Secondary | ICD-10-CM

## 2020-03-20 DIAGNOSIS — E538 Deficiency of other specified B group vitamins: Secondary | ICD-10-CM | POA: Insufficient documentation

## 2020-03-20 DIAGNOSIS — I10 Essential (primary) hypertension: Secondary | ICD-10-CM | POA: Diagnosis not present

## 2020-03-20 DIAGNOSIS — R7303 Prediabetes: Secondary | ICD-10-CM | POA: Diagnosis not present

## 2020-03-20 DIAGNOSIS — E785 Hyperlipidemia, unspecified: Secondary | ICD-10-CM

## 2020-03-20 DIAGNOSIS — M2550 Pain in unspecified joint: Secondary | ICD-10-CM | POA: Diagnosis not present

## 2020-03-20 DIAGNOSIS — M8949 Other hypertrophic osteoarthropathy, multiple sites: Secondary | ICD-10-CM

## 2020-03-20 HISTORY — DX: Deficiency of other specified B group vitamins: E53.8

## 2020-03-20 MED ORDER — CANDESARTAN CILEXETIL-HCTZ 32-12.5 MG PO TABS: 1.0000 | ORAL_TABLET | Freq: Every day | ORAL | 3 refills | Status: DC

## 2020-03-20 MED ORDER — ROSUVASTATIN CALCIUM 20 MG PO TABS: 20.0000 mg | ORAL_TABLET | Freq: Every day | ORAL | 3 refills | Status: DC

## 2020-03-20 NOTE — Progress Notes (Signed)
 Location:  [facility]   Place of Service:  Clinic (12)  Provider: Tiffany L. Reed, D.O., C.M.D.  Code Status: DNR Goals of Care:  Advanced Directives 03/20/2020  Does Patient Have a Medical Advance Directive? Yes  Type of Advance Directive Healthcare Power of Attorney;Out of facility DNR (pink MOST or yellow form)  Does patient want to make changes to medical advance directive? No - Patient declined  Copy of Healthcare Power of Attorney in Chart? Yes - validated most recent copy scanned in chart (See row information)  Pre-existing out of facility DNR order (yellow form or pink MOST form) -     Chief Complaint  Patient presents with  . Medical Management of Chronic Issues    6 month follow up     HPI: Patient is a 73 y.o. female seen today for medical management of chronic diseases.    Had shot in thumb and 4th finger on right.  Says it now feels like all fingers are swollen.  3rd finger now has swelling of PIP.  Will have pain in PIPs, DIPs, even hands.  Ok if she sleeps with her hands gripped.  If she keeps them open, they are stiff in the am. Only takes a few minutes for her to be able to move her fingers.  Her toes feel stiff like that at the end of the day.   Now in the am, she has a trigger of the right thumb, but it goes away.    Takes two tylenol before bed.  If she has to get up at night to go to the bathroom.  Different parts ache like a shoulder, knee.  Knee lately is ok which she thinks is from increased exercise--that's where she had used voltaren.  Does not help hands.    Not bothered doing exercise with Robin chairfit 2.  Does swimming now--ROM getting much better after her surgeries for her breast cancer.  She's also walking more.    She has gained weight and she's been exercising more.  Has two eggs for breakfast.  Has cut her wine in half--has a 6 oz glass of wine nightly now.  She thinks maybe she is snacking more at night. She is not eating  dessert, breads, side starches.     She's not been checking her bp religiously like she used to--historically has been aware of high bp.    Bowels are doing ok.    Past Medical History:  Diagnosis Date  . Arthritis    Per [facility] New Patient Packet   . Bilateral malignant neoplasm of breast in female (HCC) 10/18/2018  . Breast cancer (HCC)    Per [facility] New Patient Packet   . Essential hypertension 10/25/2015  . High blood pressure    Per [facility] New Patient Packet   . High cholesterol    Per [facility] New Patient Packet   . Hyperlipidemia LDL goal <130 10/25/2015  . Prediabetes 11/19/2017    Past Surgical History:  Procedure Laterality Date  . CATARACT EXTRACTION  2018   Per [facility] New Patient Packet, Dr.Todd Leventhal   . CESAREAN SECTION     x2  . COLONOSCOPY  2018   Per [facility] new patient packet, Dr.Locker  . MASTECTOMY Bilateral 02/02/2019   Per [facility] New Patient Packet, DrMichele Blackwood and Dr.Colon   . MENISCECTOMY Right 2009  . MENISCUS REPAIR  2004   Per [facility] New Patient Packet, Dr. Decter  . TONSILLECTOMY  1951   Per   [facility] New Patient Packet    No Known Allergies  Outpatient Encounter Medications as of 03/20/2020  Medication Sig  . Azelastine-Fluticasone 137-50 MCG/ACT SUSP PLACE 1 SPRAY INTO BOTH NOSTRILS DAILY.  . Cholecalciferol (VITAMIN D3) 50 MCG (2000 UT) capsule Take 2,000 Units by mouth daily.  . diclofenac sodium (VOLTAREN) 1 % GEL Apply 2 g topically 2 (two) times daily.  . esomeprazole (NEXIUM) 20 MG capsule Take 20 mg by mouth daily at 12 noon.  . exemestane (AROMASIN) 25 MG tablet Take 1 tablet (25 mg total) by mouth daily.  . Glucosamine 500 MG CAPS Take by mouth daily.  . Polyethyl Glycol-Propyl Glycol (SYSTANE) 0.4-0.3 % SOLN Apply to eye daily.  . psyllium (METAMUCIL) 58.6 % packet Take 1 packet by mouth daily.  . rosuvastatin (CRESTOR) 10 MG tablet Take 1 tablet (10 mg total) by mouth daily.  . Rosuvastatin Calcium 10 MG CPSP   . candesartan-hydrochlorothiazide  (ATACAND HCT) 32-12.5 MG tablet Take 1 tablet by mouth daily.   No facility-administered encounter medications on file as of 03/20/2020.    Review of Systems:  Review of Systems  Constitutional: Negative for chills and fever.  HENT: Negative for congestion.   Eyes: Negative for blurred vision.  Respiratory: Negative for shortness of breath.   Cardiovascular: Negative for chest pain, palpitations and leg swelling.  Gastrointestinal: Negative for abdominal pain and constipation.  Genitourinary: Negative for dysuria.  Musculoskeletal: Positive for joint pain. Negative for back pain and falls.  Skin: Negative for itching and rash.  Neurological: Negative for dizziness and loss of consciousness.  Psychiatric/Behavioral: Negative for depression and memory loss. The patient is not nervous/anxious and does not have insomnia.     Health Maintenance  Topic Date Due  . Hepatitis C Screening  Never done  . COVID-19 Vaccine (1) Never done  . INFLUENZA VACCINE  03/24/2020  . MAMMOGRAM  09/16/2020  . COLONOSCOPY  03/15/2023  . TETANUS/TDAP  08/27/2024  . DEXA SCAN  Completed  . PNA vac Low Risk Adult  Completed    Physical Exam: Vitals:   03/20/20 0835  BP: (!) 140/92  Pulse: 79  Temp: 97.7 F (36.5 C)  TempSrc: Temporal  SpO2: 96%  Weight: 185 lb (83.9 kg)  Height: 5' 6" (1.676 m)   Body mass index is 29.86 kg/m. Physical Exam Vitals reviewed.  Constitutional:      General: She is not in acute distress.    Appearance: Normal appearance. She is not toxic-appearing.  HENT:     Head: Normocephalic and atraumatic.  Cardiovascular:     Rate and Rhythm: Normal rate and regular rhythm.  Pulmonary:     Effort: Pulmonary effort is normal.     Breath sounds: Normal breath sounds. No wheezing, rhonchi or rales.  Musculoskeletal:        General: Normal range of motion.     Right lower leg: No edema.     Left lower leg: No edema.     Comments: Some generalized swelling of fingers of  both hands, some herberden's and bouchard's nodes visible especially right 3rd and 4th digits  Skin:    General: Skin is warm and dry.  Neurological:     General: No focal deficit present.     Mental Status: She is alert and oriented to person, place, and time. Mental status is at baseline.  Psychiatric:        Mood and Affect: Mood normal.        Behavior: Behavior   normal.        Thought Content: Thought content normal.        Judgment: Judgment normal.     Labs reviewed: Basic Metabolic Panel: Recent Labs    09/14/19 0500  NA 141  K 4.5  CL 101  CO2 27*  BUN 20  CREATININE 0.6  CALCIUM 10.5   Liver Function Tests: Recent Labs    09/14/19 0500  ALBUMIN 5.0   No results for input(s): LIPASE, AMYLASE in the last 8760 hours. No results for input(s): AMMONIA in the last 8760 hours. CBC: Recent Labs    09/14/19 0500  WBC 5.9  HGB 14.8  HCT 43  PLT 239   Lipid Panel: Recent Labs    09/14/19 0500  CHOL 212*  LDLCALC 106  TRIG 211*   Lab Results  Component Value Date   HGBA1C 6.0 09/14/2019    Procedures since last visit: No results found.  Assessment/Plan 1. Primary osteoarthritis involving multiple joints -I'm going to check ESR, CRP in am to see if she has any inflammatory component to her arthritis -if so, will refer to rheum; if not, cont conservative mgt, also suspect role of exemestane  2. Aromatase inhibitor-associated arthralgia -on exemestane for breast cancer recurrence prevention  3. Essential hypertension - initial bp was high--recheck was still high -work on diet more, cont exercise program; if not better next time, will adjust meds - candesartan-hydrochlorothiazide (ATACAND HCT) 32-12.5 MG tablet; Take 1 tablet by mouth daily.  Dispense: 90 tablet; Refill: 3  4. Prediabetes -last hba1c technically diabetic range at 6.8; will recheck in 6 mos -working on dietary changes which we reviewed extensively today -if remains over 6.5, will  start metformin  5. Hyperlipidemia LDL goal <100 - LDL up, TG down - increase crestor and work on diet--eliminate daily 2 eggs and cottage cheese on salads - rosuvastatin (CRESTOR) 20 MG tablet; Take 1 tablet (20 mg total) by mouth daily.  Dispense: 90 tablet; Refill: 3  6. B12 deficiency -begin B12 1046mg daily based on level less than 500 on labs   Labs/tests ordered:  Cbc, cmp, flp, hba1c before 6 mos CPE; also ESR and CRP next draw Next appt:  6 mos CPE  Zyla Dascenzo L. Keshara Kiger, D.O. [facility] [address] [place] [id number] Phone (Mon-Fri 8am-5pm):  [phone] Call:  [phone]& follow prompts after 5pm & weekends Office Phone:  [phone] Fax:  [phone]

## 2020-03-21 ENCOUNTER — Encounter: Payer: Self-pay | Admitting: Internal Medicine

## 2020-03-21 ENCOUNTER — Telehealth: Payer: Self-pay

## 2020-03-21 DIAGNOSIS — I10 Essential (primary) hypertension: Secondary | ICD-10-CM | POA: Diagnosis not present

## 2020-03-21 NOTE — Telephone Encounter (Signed)
Per Dr. Mariea Clonts recommend B 12 1000 mcg 1000 daily (naturmade) up HbA1 vs 6 in Jan.                Goal for B 12>400-500  Goal for  A1c <6

## 2020-03-27 ENCOUNTER — Telehealth: Payer: Self-pay

## 2020-03-27 NOTE — Telephone Encounter (Signed)
Per Dr. Druscilla Brownie do not suggest inflammatory arthritis. 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  Also negative Hep C

## 2020-03-28 NOTE — Telephone Encounter (Signed)
This means that a referral to rheumatology is not needed at this time.  I recommend continuing to try to be active to keep joints lubricated, use tylenol for pain, topical voltaren gel.  If pain unbearable, let me know and we could do a short course of prescription NSAID like meloxicam but it's not good to take those long-term due to cardiovascular, kidney and stomach bleeding problems that can ensue.

## 2020-03-28 NOTE — Telephone Encounter (Signed)
Message given to Seryna, she stated she will continue to do what she's doing and take more tylenol

## 2020-03-28 NOTE — Telephone Encounter (Signed)
I called and left a message for her to get her lab results

## 2020-03-28 NOTE — Telephone Encounter (Signed)
lI gave Victoria Wolfe her test results and she said what does she suppose to do about this information. She is leaving for the beach on Saturday and will be looking  In my chart for the answer.

## 2020-04-20 ENCOUNTER — Other Ambulatory Visit: Payer: Self-pay | Admitting: Internal Medicine

## 2020-05-03 ENCOUNTER — Encounter: Payer: Self-pay | Admitting: Internal Medicine

## 2020-05-04 ENCOUNTER — Encounter: Payer: Self-pay | Admitting: Hematology

## 2020-05-27 NOTE — Progress Notes (Signed)
[place]   Telephone:(336) [phone] Fax:(336) [phone]   Clinic Follow up Note   Patient Care Team: Gayland Curry, DO as PCP - General (Geriatric Medicine)  Date of Service:  05/29/2020  CHIEF COMPLAINT: F/u of bilateral breast cancer   SUMMARY OF ONCOLOGIC HISTORY: Oncology History Overview Note  Cancer Staging Cancer of overlapping sites of left breast [facility]) Staging form: Breast, AJCC 8th Edition - Clinical stage from 09/27/2018: Stage IA (cT1b, cN0, cM0, G2, ER+, PR+, HER2-) - Signed by Truitt Merle, MD on 12/07/2019 - Pathologic stage from 02/02/2019: Stage IA (pT1b, pN0, cM0, G2, ER+, PR+, HER2-) - Signed by Truitt Merle, MD on 12/07/2019     Cancer of overlapping sites of left breast ([place])  09/27/2018 Cancer Staging   Staging form: Breast, AJCC 8th Edition - Clinical stage from 09/27/2018: Stage IA (cT1b, cN0, cM0, G2, ER+, PR+, HER2-) - Signed by Truitt Merle, MD on 12/07/2019   10/04/2018 Mammogram   10/04/18 B/l mammogram - multifocal malignancy left breast with one prominent axillary node. Lesions are seperated by at least 3cm, but additional disease is not excluded indeterminate right enhancement-additional spot mammographic imaging and [place] advised to locate for sampling. Birads 6.   10/07/2018 Mammogram   10/07/18 - mammogram suspicious of malignancy. the nodule in the right breast middle depth lateral region seen on the craniocausal view only is at alow suspicion for malignancy. a stereotactic biopsy is reocmnended. Birads 4a low suspicions for malignancy    10/07/2018 Breast US   10/07/18 B/L [place] - known biopsy proven malignancy. The mass in the lift breast at 11: 00 positoin psterior depth is a known biopsy positive for malignancy. Birads 6 known malignancy    10/18/2018 Initial Diagnosis   Bilateral malignant neoplasm of breast in female [facility])   10/19/2018 Initial Biopsy   09/27/2018 Left breast biopsy  -11:00. 5cm from nipple: invasive ductal carcinoma, ER96%+, PR 98%+,  HER2-, Ki67 25% -Left breast, upper lesion: invasive ductal carcinoma, ER96%+, PR 98%+, HER2-, Ki67 20% 10/19/18 Right breast core biopsy -Bening fibroadipose breast tissue with focal usual ductal hyperplasia.     2020 Genetic Testing   Genetc testing negative    11/2018 - 01/2019 Neo-Adjuvant Anti-estrogen oral therapy   Neoadjuvant Anastrozole once daily for 6-8 weeks and had great response to treatment. Did not tolerate well.    02/02/2019 Surgery   B/l total mastectomy with Sentinel Node Biopsy by Dr. Pasty Arch 12/08/18 PATHOLOGY: 1. 2 left axillary sentinel lymph node were negative for malignant cells. 2. Left breast mastectomy: Invasive and in situ ductal carcinoma, 47m, associated with metal clip and prior biopsy site change, at the 11:00-12:00 position; a second metal clip is present at the 8:00 to 9 o'clock position, no evidence of malignancy 3. Right breast mastectomy: Benign breast tissue   02/02/2019 Cancer Staging   Staging form: Breast, AJCC 8th Edition - Pathologic stage from 02/02/2019: Stage IA (pT1b, pN0, cM0, G2, ER+, PR+, HER2-) - Signed by FTruitt Merle MD on 12/07/2019   02/2019 -  Anti-estrogen oral therapy   Letrozole once daily for 2 months since the end of 02/2019-04/2019. Did not tolerate well and was switched to Exemestane once daily in 04/2019.    06/30/2019 Surgery   B/l Breast Reconstruction with Dr. CGreig Castilla11/6/20   10/17/2019 Imaging   DEXA ASSESSMENT: The BMD measured at Femur Neck Left is 0.982 g/cm2 with a T-score of -0.4. This patient is considered normal according to [place] criteria.  CURRENT THERAPY:  Exemestane once daily   INTERVAL HISTORY:  Victoria Wolfe is here for a follow up. She presents to the clinic alone. She notes she is doing well. She notes she was seen by Dermatologist who wants to f/u on her right breast skin lesion that was biopsies 4 years ago, benign. Her skin lesions has not changed at all in 4 years. She  notes she still has joint aches and pains. She is able to remain active with swimming and doing stairs and walking. She notes she is having issues with trigger fingers and swelling, R>L and plans to see hand specialist next week. She has been on Magnilife 4 times a day and 6 tabs of Tylenol some days for her joint pain which helps.     REVIEW OF SYSTEMS:   Constitutional: Denies fevers, chills or abnormal weight loss Eyes: Denies blurriness of vision Ears, nose, mouth, throat, and face: Denies mucositis or sore throat Respiratory: Denies cough, dyspnea or wheezes Cardiovascular: Denies palpitation, chest discomfort or lower extremity swelling Gastrointestinal:  Denies nausea, heartburn or change in bowel habits Skin: Denies abnormal skin rashes MSK: (+) Diffuse joint pain (+) B/l trigger finger and swelling, R>L Lymphatics: Denies new lymphadenopathy or easy bruising Neurological:Denies numbness, tingling or new weaknesses Behavioral/Psych: Mood is stable, no new changes  All other systems were reviewed with the patient and are negative.  MEDICAL HISTORY:  Past Medical History:  Diagnosis Date  . Arthritis    Per [facility] New Patient Packet   . Bilateral malignant neoplasm of breast in female [facility]) 10/18/2018  . Breast cancer [facility])    Per [place] New Patient Packet   . Essential hypertension 10/25/2015  . High blood pressure    Per [place] Patient Packet   . High cholesterol    Per [place] New Patient Packet   . Hyperlipidemia LDL goal <130 10/25/2015  . Prediabetes 11/19/2017    SURGICAL HISTORY: Past Surgical History:  Procedure Laterality Date  . CATARACT EXTRACTION  2018   Per [place] New Patient Packet, [place]     x2  . COLONOSCOPY  2018   Per [place] new patient packet, Dr.Locker  . MASTECTOMY Bilateral 02/02/2019   Per [place] New Patient Packet, DrMichele Blackwood and Dr.Colon   . MENISCECTOMY Right 2009  . MENISCUS REPAIR  2004   Per [place] Patient  Packet, Dr. Milagros Reap  . TONSILLECTOMY  1951   Per [place] New Patient Packet    I have reviewed the social history and family history with the patient and they are unchanged from previous note.  ALLERGIES:  has No Known Allergies.  MEDICATIONS:  Current Outpatient Medications  Medication Sig Dispense Refill  . Cyanocobalamin (VITAMIN B-12) 2500 MCG SUBL     . OVER THE COUNTER MEDICATION Magnilife Pain and Inflammation Relief    . Azelastine-Fluticasone 137-50 MCG/ACT SUSP PLACE 1 SPRAY INTO BOTH NOSTRILS DAILY. 23 g 1  . candesartan-hydrochlorothiazide (ATACAND HCT) 32-12.5 MG tablet Take 1 tablet by mouth daily. 90 tablet 3  . Cholecalciferol (VITAMIN D3) 50 MCG (2000 UT) capsule Take 2,000 Units by mouth daily.    . diclofenac sodium (VOLTAREN) 1 % GEL Apply 2 g topically 2 (two) times daily.    Marland Kitchen esomeprazole (NEXIUM) 20 MG capsule Take 20 mg by mouth daily at 12 noon.    Marland Kitchen exemestane (AROMASIN) 25 MG tablet Take 1 tablet (25 mg total) by mouth daily. 90 tablet 3  .  Glucosamine 500 MG CAPS Take by mouth daily.    Vladimir Faster Glycol-Propyl Glycol (SYSTANE) 0.4-0.3 % SOLN Apply to eye daily.    . psyllium (METAMUCIL) 58.6 % packet Take 1 packet by mouth daily.    . rosuvastatin (CRESTOR) 20 MG tablet Take 1 tablet (20 mg total) by mouth daily. 90 tablet 3   No current facility-administered medications for this visit.    PHYSICAL EXAMINATION: ECOG PERFORMANCE STATUS: 0 - Asymptomatic  Vitals:   05/29/20 1139  BP: (!) 162/99  Pulse: 79  Resp: 18  Temp: 99.6 F (37.6 C)  SpO2: 97%   Filed Weights   05/29/20 1139  Weight: 185 lb 11.2 oz (84.2 kg)    GENERAL:alert, no distress and comfortable SKIN: skin color, texture, turgor are normal, no rashes or significant lesions EYES: normal, Conjunctiva are pink and non-injected, sclera clear  NECK: supple, thyroid normal size, non-tender, without nodularity LYMPH:  no palpable lymphadenopathy in the cervical, axillary  LUNGS: clear  to auscultation and percussion with normal breathing effort HEART: regular rate & rhythm and no murmurs and no lower extremity edema ABDOMEN:abdomen soft, non-tender and normal bowel sounds Musculoskeletal:no cyanosis of digits and no clubbing  NEURO: alert & oriented x 3 with fluent speech, no focal motor/sensory deficits BREAST: S/p b/l mastectomy and reconstruction with implants: Surgical incisions healed well.   LABORATORY DATA:  I have reviewed the data as listed CBC Latest Ref Rng & Units 09/14/2019 01/14/2019 11/16/2017  WBC - 5.9 6.3 6.4  Hemoglobin 12.0 - 16.0 14.8 14.3 13.8  Hematocrit 36 - 46 43 41 41  Platelets 150 - 399 239 272 242     CMP Latest Ref Rng & Units 09/14/2019 01/14/2019 11/16/2017  BUN 4 - 21 20 17 19   Creatinine 0.5 - 1.1 0.6 0.7 0.6  Sodium 137 - 147 141 136(A) 138  Potassium 3.4 - 5.3 4.5 4.3 4.2  Chloride 99 - 108 101 - -  CO2 13 - 22 27(A) - -  Calcium 8.7 - 10.7 10.5 - -  Alkaline Phos 25 - 125 - 83 62  AST 13 - 35 - 23 26  ALT 7 - 35 - 25 34      RADIOGRAPHIC STUDIES: I have personally reviewed the radiological images as listed and agreed with the findings in the report. No results found.   ASSESSMENT & PLAN:  Anaya Bovee is a 74 y.o. female with    1. Left multifocal breast Cancer, stage IA (mpT1bN0), ER+/PR+/HER2- -She was diagnosed with stage 1 breast cancer and treated in [place]. She was treated with neoadjuvant anastrozole for 6-8 weeks, b/l mastectomy and adjuvant AI. -She did not tolerate Anastrozole or Letrozole due to arthralgia. She is currently on Exemestane which she tolerates well with mild joint pain and hot flashes. Will continue for at least 5 years. -She continues to have moderate joint pain which has been more manageable on Exemestane. If this progresses will consider switching to Tamoxifen, we reviewed the benefit and side effects today  -She is clinically doing well. Her physical exam was unremarkable. There is no  clinical concern for recurrence. -Continue surveillance. Given B/l mastectomy she does not require mammograms.  -F/u in 6 months   2. Comorbidities: HTN, Arthritis with right knee pain, HLD.  -On Crestor, Candesartan/HCTZ. Managed by PCP and Ortho -Her overall arthritis is manage on Exemestane but has progressed in her hands. She will consult with hand specialist soon.  -I encouraged her to continue  exercise. She is on Magnilife 4 times a day and 6 tabs Tylenol some days.     3. Bone Health  -Her 09/2019 DEXA was normal (-0.4 T-score).  -Given she is on AI which can weaken her bone will repeat every 2 years.    4. Cancer Screenings  -She is due to colonoscopy in 02/2021 due to polyps on prior procedure.  -I recommend she continue Pap Smears with her Gyn.    PLAN:  -Continue Exemestane  -F/u in 6 months, she will do lab (CBC and CMP) with PCP Dr. Mariea Clonts in interim     No problem-specific [place] notes found for this encounter.   No orders of the defined types were placed in this encounter.  All questions were answered. The patient knows to call the clinic with any problems, questions or concerns. No barriers to learning was detected. The total time spent in the appointment was 25 minutes.     Truitt Merle, MD 05/29/2020   I, Joslyn Devon, am acting as scribe for Truitt Merle, MD.   I have reviewed the above documentation for accuracy and completeness, and I agree with the above.

## 2020-05-29 ENCOUNTER — Other Ambulatory Visit: Payer: Self-pay

## 2020-05-29 ENCOUNTER — Encounter: Payer: Self-pay | Admitting: Hematology

## 2020-05-29 ENCOUNTER — Inpatient Hospital Stay: Payer: Managed Care, Other (non HMO) | Attending: Hematology | Admitting: Hematology

## 2020-05-29 VITALS — BP 162/99 | HR 79 | Temp 99.6°F | Resp 18 | Ht 66.0 in | Wt 185.7 lb

## 2020-05-29 DIAGNOSIS — M2550 Pain in unspecified joint: Secondary | ICD-10-CM | POA: Insufficient documentation

## 2020-05-29 DIAGNOSIS — Z9013 Acquired absence of bilateral breasts and nipples: Secondary | ICD-10-CM | POA: Diagnosis not present

## 2020-05-29 DIAGNOSIS — Z79899 Other long term (current) drug therapy: Secondary | ICD-10-CM | POA: Diagnosis not present

## 2020-05-29 DIAGNOSIS — Z170 Estrogen receptor positive status [ER+]: Secondary | ICD-10-CM | POA: Insufficient documentation

## 2020-05-29 DIAGNOSIS — Z79811 Long term (current) use of aromatase inhibitors: Secondary | ICD-10-CM | POA: Insufficient documentation

## 2020-05-29 DIAGNOSIS — E785 Hyperlipidemia, unspecified: Secondary | ICD-10-CM | POA: Insufficient documentation

## 2020-05-29 DIAGNOSIS — I10 Essential (primary) hypertension: Secondary | ICD-10-CM | POA: Diagnosis not present

## 2020-05-29 DIAGNOSIS — E7800 Pure hypercholesterolemia, unspecified: Secondary | ICD-10-CM | POA: Diagnosis not present

## 2020-05-29 DIAGNOSIS — R232 Flushing: Secondary | ICD-10-CM | POA: Diagnosis not present

## 2020-05-29 DIAGNOSIS — C50812 Malignant neoplasm of overlapping sites of left female breast: Secondary | ICD-10-CM | POA: Insufficient documentation

## 2020-05-29 DIAGNOSIS — M129 Arthropathy, unspecified: Secondary | ICD-10-CM | POA: Insufficient documentation

## 2020-05-30 ENCOUNTER — Telehealth: Payer: Self-pay | Admitting: Hematology

## 2020-05-30 NOTE — Telephone Encounter (Signed)
Scheduled per 10/6 los. Unable to reach pt. Left voicemail with appt times and date.

## 2020-06-03 ENCOUNTER — Encounter: Payer: Self-pay | Admitting: Hematology

## 2020-06-04 ENCOUNTER — Other Ambulatory Visit: Payer: Self-pay

## 2020-06-04 DIAGNOSIS — C50812 Malignant neoplasm of overlapping sites of left female breast: Secondary | ICD-10-CM

## 2020-06-04 MED ORDER — TAMOXIFEN CITRATE 20 MG PO TABS: 20.0000 mg | ORAL_TABLET | Freq: Every day | ORAL | 3 refills | Status: DC

## 2020-06-07 ENCOUNTER — Encounter: Payer: Self-pay | Admitting: Internal Medicine

## 2020-06-21 ENCOUNTER — Other Ambulatory Visit: Payer: Self-pay | Admitting: Internal Medicine

## 2020-08-22 ENCOUNTER — Encounter: Payer: Self-pay | Admitting: Family

## 2020-08-22 ENCOUNTER — Other Ambulatory Visit: Payer: Self-pay

## 2020-08-22 ENCOUNTER — Telehealth (INDEPENDENT_AMBULATORY_CARE_PROVIDER_SITE_OTHER): Payer: Managed Care, Other (non HMO) | Admitting: Family

## 2020-08-22 DIAGNOSIS — U071 COVID-19: Secondary | ICD-10-CM

## 2020-08-22 DIAGNOSIS — A0839 Other viral enteritis: Secondary | ICD-10-CM

## 2020-08-22 DIAGNOSIS — Z20822 Contact with and (suspected) exposure to covid-19: Secondary | ICD-10-CM

## 2020-08-22 MED ORDER — ASCORBIC ACID 500 MG PO TABS: 250.0000 mg | ORAL_TABLET | Freq: Two times a day (BID) | ORAL | 0 refills | Status: AC

## 2020-08-22 MED ORDER — LOPERAMIDE HCL 2 MG PO TABS: 2.0000 mg | ORAL_TABLET | Freq: Four times a day (QID) | ORAL | 0 refills | Status: DC | PRN

## 2020-08-22 MED ORDER — ZINC GLUCONATE 50 MG PO TABS: 50.0000 mg | ORAL_TABLET | Freq: Every day | ORAL | 0 refills | Status: AC

## 2020-08-22 NOTE — Patient Instructions (Addendum)
-   vitamin C (VITAMIN C) 500 MG tablet; Take 0.5 tablets (250 mg total) by mouth 2 (two) times daily for 14 days.  Dispense: 14 tablet; Refill: 0  - zinc gluconate 50 MG tablet; Take 1 tablet (50 mg total) by mouth daily for 14 days.  Dispense: 14 tablet; Refill: 0  - continue on Vitamin D 5000 units daily   - loperamide (IMODIUM A-D) 2 MG tablet; Take 1 tablet (2 mg total) by mouth 4 (four) times daily as needed for diarrhea or loose stools.  Dispense: 30 tablet; Refill: 0 - Increase fluid/water intake to 6-8 glasses daily   - continue self Quarantine,social distancing,hand wash and wearing mask per CDC guidelines.   - Notify provider or go to ED if symptoms worsen or develops any shortness of breath,chest tightness ,chest pain or palpitation.

## 2020-08-22 NOTE — Progress Notes (Signed)
This service is provided via telemedicine  No vital signs collected/recorded due to the encounter was a telemedicine visit.   Location of patient (ex: home, work): Home.  Patient consents to a telephone visit: Yes.  Location of the provider (ex: office, home):  [facility].  Name of any referring provider: Gayland Curry, DO   Names of all persons participating in the telemedicine service and their role in the encounter: Patient, Heriberto [place], [place], [place], Webb Silversmith, NP.    Time spent on call: 8 minutes spent on the phone with Medical Assistant.   Provider: Rayen Palen FNP-C  Gayland Curry, DO  Patient Care Team: Gayland Curry, DO as PCP - General (Geriatric Medicine)  Extended Emergency Contact Information Primary Emergency Contact: [place] Mobile Phone: [phone] Relation: Sister  Code Status:  Full Code  Goals of care: Advanced Directive information Advanced Directives 08/22/2020  Does Patient Have a Medical Advance Directive? Yes  Type of Paramedic of [place];Living will  Does patient want to make changes to medical advance directive? No - Patient declined  Copy of [place] in Chart? Yes - validated most recent copy scanned in chart (See row information)  Pre-existing out of facility DNR order (yellow form or pink MOST form) -     Chief Complaint  Patient presents with   Acute Visit    Complains of headaches, fever 100.9, congestion, and diarrhea x 3 days.    HPI:  Pt is a 74 y.o. female seen today for an acute visit for evaluation of  Headache,fever,congestion and diarrhea x 3 days.She states was in [place] travelling over the holiday when she returned  learned that 64 yr old grandchild was positive for COVID-19.Has been doing well until three days ago started having headache and congestion.Has chronic post nasal drip which has not worsen.started running fever 100.9 this morning.Has had  severe diarrhea this morning,runny nose.Tyelnol has been effective. Her home COVID-19  test x 3 times have been negative. No loss of taste or smell.No shortness of breath or cough. Has completed her COVID-19 vaccine .Has taken Vitamin D 5000 units daily.  Has [place] in [place] at [place].     Past Medical History:  Diagnosis Date   Arthritis    Per [facility] New Patient Packet    Bilateral malignant neoplasm of breast in female [facility]) 10/18/2018   Breast cancer [facility])    Per [facility] New Patient Packet    Essential hypertension 10/25/2015   High blood pressure    Per [place] New Patient Packet    High cholesterol    Per [place] New Patient Packet    Hyperlipidemia LDL goal <130 10/25/2015   Prediabetes 11/19/2017   Past Surgical History:  Procedure Laterality Date   CATARACT EXTRACTION  2018   Per [place] New Patient Packet, [place]     x2   COLONOSCOPY  2018   Per [place] new patient packet, Dr.Locker   MASTECTOMY Bilateral 02/02/2019   Per [facility] New Patient Packet, DrMichele Blackwood and Dr.Colon    MENISCECTOMY Right 2009   MENISCUS REPAIR  2004   Per [facility] New Patient Packet, Dr. Milagros Reap   TONSILLECTOMY  [phone]   Per [place] Patient Packet    No Known Allergies  Outpatient Encounter Medications as of 08/22/2020  Medication Sig   Azelastine-Fluticasone 137-50 MCG/ACT SUSP PLACE 1 SPRAY INTO BOTH NOSTRILS DAILY.   candesartan-hydrochlorothiazide (ATACAND  HCT) 32-12.5 MG tablet Take 1 tablet by mouth daily.   Cholecalciferol (VITAMIN D3) 50 MCG (2000 UT) capsule Take 2,000 Units by mouth daily.   Cyanocobalamin (VITAMIN B-12) 2500 MCG SUBL    esomeprazole (NEXIUM) 20 MG capsule Take 20 mg by mouth daily at 12 noon.   Glucosamine 500 MG CAPS Take by mouth daily.   OVER THE COUNTER MEDICATION Apply 1 application topically 3 (three) times daily as needed. Magnilife Pain and Inflammation Relief   Polyethyl Glycol-Propyl Glycol (SYSTANE)  0.4-0.3 % SOLN Apply to eye daily.   psyllium (METAMUCIL) 58.6 % packet Take 1 packet by mouth daily.   rosuvastatin (CRESTOR) 20 MG tablet Take 1 tablet (20 mg total) by mouth daily.   tamoxifen (NOLVADEX) 20 MG tablet Take 1 tablet (20 mg total) by mouth daily.   [DISCONTINUED] diclofenac sodium (VOLTAREN) 1 % GEL Apply 2 g topically 2 (two) times daily.   No facility-administered encounter medications on file as of 08/22/2020.    Review of Systems  Constitutional: Positive for fever. Negative for appetite change, chills and fatigue.       Temp 100.9   HENT: Negative for rhinorrhea, sinus pressure, sinus pain, sneezing and sore throat.        Chronic post nasal drip   Eyes: Negative for discharge, redness and itching.  Respiratory: Negative for cough, chest tightness, shortness of breath and wheezing.   Cardiovascular: Negative for chest pain, palpitations and leg swelling.  Gastrointestinal: Positive for diarrhea. Negative for abdominal distention, abdominal pain, constipation, nausea and vomiting.  Genitourinary: Negative for difficulty urinating, dysuria, flank pain, frequency and urgency.  Musculoskeletal: Negative for gait problem, joint swelling and myalgias.  Skin: Negative for color change, pallor and rash.  Neurological: Positive for headaches. Negative for dizziness, speech difficulty, weakness, light-headedness and numbness.  Psychiatric/Behavioral: Negative for agitation, confusion and self-injury. The patient is not nervous/anxious.     Immunization History  Administered Date(s) Administered   Influenza Split 06/24/2014   Influenza, High Dose Seasonal PF 05/29/2013   Influenza,inj,Quad PF,6+ Mos 06/10/2015, 05/01/2016, 05/10/2017, 06/01/2018   Influenza-Unspecified 05/24/2013, 06/24/2014, 06/10/2015, 05/01/2016, 06/01/2018, 05/18/2019, 05/25/2020   Moderna Sars-Covid-2 Vaccination 09/10/2019, 10/03/2019   Pneumococcal Conjugate-13 08/25/2015, 10/25/2015    Pneumococcal Polysaccharide-23 03/06/2013   Tdap 08/27/2014   Zoster 11/03/2010   Zoster Recombinat (Shingrix) 11/16/2017, 03/22/2018   Pertinent  Health Maintenance Due  Topic Date Due   MAMMOGRAM  09/16/2020   COLONOSCOPY (Pts 45-28yrs Insurance coverage will need to be confirmed)  03/15/2023   INFLUENZA VACCINE  Completed   DEXA SCAN  Completed   PNA vac Low Risk Adult  Completed   Fall Risk  08/22/2020 03/20/2020 11/01/2019 09/20/2019 05/10/2019  Falls in the past year? 0 0 0 0 0  Number falls in past yr: 0 0 0 0 0  Injury with Fall? 0 0 0 0 0   Functional Status Survey:    There were no vitals filed for this visit. There is no height or weight on file to calculate BMI. Physical Exam Constitutional:      General: She is not in acute distress.    Appearance: She is not ill-appearing.  HENT:     Mouth/Throat:     Mouth: Mucous membranes are moist.  Pulmonary:     Effort: Pulmonary effort is normal. No respiratory distress.  Neurological:     Mental Status: She is alert and oriented to person, place, and time.  Psychiatric:  Mood and Affect: Mood normal.        Thought Content: Thought content normal.        Judgment: Judgment normal.     Labs reviewed: Recent Labs    09/14/19 0500  NA 141  K 4.5  CL 101  CO2 27*  BUN 20  CREATININE 0.6  CALCIUM 10.5   Recent Labs    09/14/19 0500  ALBUMIN 5.0   Recent Labs    09/14/19 0500  WBC 5.9  HGB 14.8  HCT 43  PLT 239   Lab Results  Component Value Date   TSH 1.43 11/16/2017   Lab Results  Component Value Date   HGBA1C 6.8 03/14/2020   Lab Results  Component Value Date   CHOL 199 03/14/2020   HDL 50 03/14/2020   LDLCALC 111 03/14/2020   TRIG 190 (A) 03/14/2020    Significant Diagnostic Results in last 30 days:  No results found.  Assessment/Plan 1. Close exposure to COVID-19 virus Travelled to [place] over the [place] later after returning to [place] learnt that 3 yr old  Grandchild tested positive for COVID-19.Now present with headache,runny nose,Fever and diarrhea.Feels better today.Home COVID-19 Test negative x 3.Recommended PCR test here at the office but declined. - Advised to continue on Vitamin D 5000 units daily,Tylenol as needed for fever and body aches. - start Vitamin C and Zinc as below x 14 days. - Imodium for diarrhea as below.  - vitamin C (VITAMIN C) 500 MG tablet; Take 0.5 tablets (250 mg total) by mouth 2 (two) times daily for 14 days.  Dispense: 14 tablet; Refill: 0 - zinc gluconate 50 MG tablet; Take 1 tablet (50 mg total) by mouth daily for 14 days.  Dispense: 14 tablet; Refill: 0 - loperamide (IMODIUM A-D) 2 MG tablet; Take 1 tablet (2 mg total) by mouth 4 (four) times daily as needed for diarrhea or loose stools.  Dispense: 30 tablet; Refill: 0 - Advised to increase Fluid intake.Also Drink Gatorade 1 bottle daily for hydration. - continue self Quarantine,social distancing,hand wash and wearing mask per CDC guidelines.  - Notify provider or go to ED if symptoms worsen or develops any shortness of breath,chest tightness ,chest pain or palpitation.  - additional Education information provided on AVS  2. Diarrhea due to COVID-19 - Increase fluid intake 6-8 glasses of water /soup  - Avoid spicy food and sugary or drinks - Drink one bottle of Gatorade daily while until diarrhea resolve - Imodium as needed for diarrhea - loperamide (IMODIUM A-D) 2 MG tablet; Take 1 tablet (2 mg total) by mouth 4 (four) times daily as needed for diarrhea or loose stools.  Dispense: 30 tablet; Refill: 0  Family/ staff Communication: Reviewed plan of care with patient verbalized understanding.  Labs/tests ordered: None   Next Appointment: As needed if symptoms worsen of fail to improve.   I connected with  Patrecia Pace on 08/22/20 by a video enabled telemedicine application and verified that I am speaking with the correct person using two identifiers.   I  discussed the limitations of evaluation and management by telemedicine. The patient expressed understanding and agreed to proceed.  Spent 15 minutes of face to face with patient    Caesar Bookman, NP

## 2020-08-29 ENCOUNTER — Encounter: Payer: Self-pay | Admitting: Internal Medicine

## 2020-08-29 ENCOUNTER — Other Ambulatory Visit: Payer: Self-pay | Admitting: Hematology

## 2020-08-29 DIAGNOSIS — C50812 Malignant neoplasm of overlapping sites of left female breast: Secondary | ICD-10-CM

## 2020-08-29 LAB — CBC AND DIFFERENTIAL
HCT: 39 (ref 36–46)
Hemoglobin: 13.7 (ref 12.0–16.0)
Platelets: 219 (ref 150–399)
WBC: 5.9

## 2020-08-29 LAB — BASIC METABOLIC PANEL
BUN: 12 (ref 4–21)
CO2: 21 (ref 13–22)
Chloride: 103 (ref 99–108)
Creatinine: 0.6 (ref 0.5–1.1)
Glucose: 134
Potassium: 4.2 (ref 3.4–5.3)
Sodium: 140 (ref 137–147)

## 2020-08-29 LAB — COMPREHENSIVE METABOLIC PANEL
Albumin: 4.3 (ref 3.5–5.0)
Calcium: 9.2 (ref 8.7–10.7)
Globulin: 2.6

## 2020-08-29 LAB — HEPATIC FUNCTION PANEL: ALT: 43 — AB (ref 7–35)

## 2020-08-29 LAB — CBC: RBC: 4.17 (ref 3.87–5.11)

## 2020-09-04 ENCOUNTER — Other Ambulatory Visit: Payer: Self-pay

## 2020-09-04 ENCOUNTER — Encounter: Payer: Self-pay | Admitting: Internal Medicine

## 2020-09-04 ENCOUNTER — Non-Acute Institutional Stay: Payer: Managed Care, Other (non HMO) | Admitting: Internal Medicine

## 2020-09-04 ENCOUNTER — Telehealth: Payer: Self-pay

## 2020-09-04 VITALS — BP 126/82 | HR 78 | Temp 98.1°F | Ht 66.0 in | Wt 186.4 lb

## 2020-09-04 DIAGNOSIS — I10 Essential (primary) hypertension: Secondary | ICD-10-CM | POA: Diagnosis not present

## 2020-09-04 DIAGNOSIS — U071 COVID-19: Secondary | ICD-10-CM | POA: Diagnosis not present

## 2020-09-04 DIAGNOSIS — A0839 Other viral enteritis: Secondary | ICD-10-CM

## 2020-09-04 DIAGNOSIS — T451X5A Adverse effect of antineoplastic and immunosuppressive drugs, initial encounter: Secondary | ICD-10-CM

## 2020-09-04 DIAGNOSIS — M159 Polyosteoarthritis, unspecified: Secondary | ICD-10-CM

## 2020-09-04 DIAGNOSIS — Z86010 Personal history of colonic polyps: Secondary | ICD-10-CM

## 2020-09-04 DIAGNOSIS — M2550 Pain in unspecified joint: Secondary | ICD-10-CM

## 2020-09-04 DIAGNOSIS — M8949 Other hypertrophic osteoarthropathy, multiple sites: Secondary | ICD-10-CM

## 2020-09-04 DIAGNOSIS — C50911 Malignant neoplasm of unspecified site of right female breast: Secondary | ICD-10-CM

## 2020-09-04 DIAGNOSIS — E785 Hyperlipidemia, unspecified: Secondary | ICD-10-CM

## 2020-09-04 DIAGNOSIS — C50912 Malignant neoplasm of unspecified site of left female breast: Secondary | ICD-10-CM

## 2020-09-04 DIAGNOSIS — R7303 Prediabetes: Secondary | ICD-10-CM

## 2020-09-04 NOTE — Progress Notes (Signed)
Location:  Occupational psychologist of Service:  Clinic (12)  Provider: Victoire Deans L. Mariea Clonts, D.O., C.M.D.   Goals of Care:  Advanced Directives 08/22/2020  Does Patient Have a Medical Advance Directive? Yes  Type of Paramedic of [place];Living will  Does patient want to make changes to medical advance directive? No - Patient declined  Copy of [place] in Chart? Yes - validated most recent copy scanned in chart (See row information)  Pre-existing out of facility DNR order (yellow form or pink MOST form) -     Chief Complaint  Patient presents with  . Medical Management of Chronic Issues    Medical management of Chronic Issues. 6 Month Follow up    HPI: Patient is a 75 y.o. female seen today for medical management of chronic diseases.    On tamoxifen in place of exemestane so feels better.    Thinks she had covid 2 weeks ago.  Had the home tests that were negative.  Her 40 yo granddaughter had tested positive and she got symptoms tues after christmas.  thurs she had a fever of 101 which was the highest she'd had in years.  Tylenol worked.  Had bad diarrhea.  Says two neighbors here had it and recovered, too.    BP was down.    She's been watching everything she eats.  She had a 10 day period without alcohol and weight not changing.  Either does TV video or walks or swims.  Feels good.    She is seeing Dr. Amedeo Plenty for her hand.  Had the shots in her two fingers in Oct.  She may need surgery when it comes back.  Thumb is starting to gradually return.    Knee is still doing fine since her injection about a year ago now with me.    She had an inflamed rotator cuff--they played darts.  She says she was terrible at it, and then couldn't raise her arm.  Was on prednisone thru [facility].  Improved.  Has underlying arthritis.  Exercise continues to help.    Reports that at her last colonoscopy had polyps and she was to have a 2  yr callback, but she also said she's due this coming July which is actually 3 yrs from last one we have documented--had been done in [place].    Past Medical History:  Diagnosis Date  . Arthritis    Per [facility] New Patient Packet   . Bilateral malignant neoplasm of breast in female [facility]) 10/18/2018  . Breast cancer [facility])    Per [place] New Patient Packet   . Essential hypertension 10/25/2015  . High blood pressure    Per [place] Patient Packet   . High cholesterol    Per [place] New Patient Packet   . Hyperlipidemia LDL goal <130 10/25/2015  . Prediabetes 11/19/2017    Past Surgical History:  Procedure Laterality Date  . CATARACT EXTRACTION  2018   Per [place] New Patient Packet, [place]     x2  . COLONOSCOPY  2018   Per [place] new patient packet, Dr.Locker  . MASTECTOMY Bilateral 02/02/2019   Per [place] New Patient Packet, DrMichele Blackwood and Dr.Colon   . MENISCECTOMY Right 2009  . MENISCUS REPAIR  2004   Per [place] Patient Packet, Dr. Milagros Reap  . TONSILLECTOMY  1951   Per [facility] New Patient Packet    No Known Allergies  Outpatient  Encounter Medications as of 09/04/2020  Medication Sig  . Azelastine-Fluticasone 137-50 MCG/ACT SUSP PLACE 1 SPRAY INTO BOTH NOSTRILS DAILY.  . candesartan-hydrochlorothiazide (ATACAND HCT) 32-12.5 MG tablet Take 1 tablet by mouth daily.  . Cholecalciferol (VITAMIN D3) 50 MCG (2000 UT) capsule Take 2,000 Units by mouth daily.  . Cyanocobalamin (VITAMIN B-12) 2500 MCG SUBL   . esomeprazole (NEXIUM) 20 MG capsule Take 20 mg by mouth daily at 12 noon.  . Glucosamine 500 MG CAPS Take by mouth daily.  Marland Kitchen loperamide (IMODIUM A-D) 2 MG tablet Take 1 tablet (2 mg total) by mouth 4 (four) times daily as needed for diarrhea or loose stools.  Marland Kitchen OVER THE COUNTER MEDICATION Apply 1 application topically 3 (three) times daily as needed. Magnilife Pain and Inflammation Relief  . Polyethyl Glycol-Propyl Glycol (SYSTANE) 0.4-0.3 % SOLN Apply to eye daily.   . psyllium (METAMUCIL) 58.6 % packet Take 1 packet by mouth daily.  . rosuvastatin (CRESTOR) 20 MG tablet Take 1 tablet (20 mg total) by mouth daily.  . tamoxifen (NOLVADEX) 20 MG tablet TAKE 1 TABLET BY MOUTH EVERY DAY  . UNABLE TO FIND Magnalife Gel Apply to joints 2-3 times daily as needed.  . vitamin C (VITAMIN C) 500 MG tablet Take 0.5 tablets (250 mg total) by mouth 2 (two) times daily for 14 days.  Marland Kitchen zinc gluconate 50 MG tablet Take 1 tablet (50 mg total) by mouth daily for 14 days.   No facility-administered encounter medications on file as of 09/04/2020.    Review of Systems:  Review of Systems  Constitutional: Negative for chills, fever and malaise/fatigue.  Eyes: Negative for blurred vision.  Respiratory: Negative for cough and shortness of breath.   Cardiovascular: Negative for chest pain, palpitations and leg swelling.  Gastrointestinal: Negative for abdominal pain, blood in stool, constipation, diarrhea and melena.       Diarrhea with covid, but resolved  Genitourinary: Negative for dysuria.  Musculoskeletal: Positive for joint pain. Negative for falls.  Skin: Negative for itching and rash.  Neurological: Negative for dizziness, loss of consciousness and weakness.  Psychiatric/Behavioral: Negative for depression and memory loss. The patient is not nervous/anxious and does not have insomnia.     Health Maintenance  Topic Date Due  . COVID-19 Vaccine (2 - Moderna risk 4-dose series) 10/31/2019  . MAMMOGRAM  09/16/2020  . COLONOSCOPY (Pts 45-43yrs Insurance coverage will need to be confirmed)  03/15/2023  . TETANUS/TDAP  08/27/2024  . INFLUENZA VACCINE  Completed  . DEXA SCAN  Completed  . Hepatitis C Screening  Completed  . PNA vac Low Risk Adult  Completed    Physical Exam: Vitals:   09/04/20 0926  BP: 126/82  Pulse: 78  Temp: 98.1 F (36.7 C)  TempSrc: Skin  SpO2: 98%  Weight: 186 lb 6.4 oz (84.6 kg)  Height: 5\' 6"  (1.676 m)   Body mass index is 30.09  kg/m. Physical Exam Vitals reviewed.  Constitutional:      General: She is not in acute distress.    Appearance: Normal appearance. She is not toxic-appearing.  HENT:     Head: Normocephalic and atraumatic.     Right Ear: External ear normal.     Left Ear: External ear normal.  Eyes:     Conjunctiva/sclera: Conjunctivae normal.     Pupils: Pupils are equal, round, and reactive to light.     Comments: glasses  Cardiovascular:     Rate and Rhythm: Normal rate and regular rhythm.  Pulses: Normal pulses.     Heart sounds: Normal heart sounds.  Pulmonary:     Effort: Pulmonary effort is normal.     Breath sounds: Normal breath sounds. No wheezing, rhonchi or rales.  Abdominal:     General: Bowel sounds are normal.  Musculoskeletal:        General: Normal range of motion.     Cervical back: Neck supple.     Right lower leg: No edema.     Left lower leg: No edema.  Neurological:     General: No focal deficit present.     Mental Status: She is alert and oriented to person, place, and time.  Psychiatric:        Mood and Affect: Mood normal.     Labs reviewed: Basic Metabolic Panel: Recent Labs    09/14/19 0500  NA 141  K 4.5  CL 101  CO2 27*  BUN 20  CREATININE 0.6  CALCIUM 10.5   Liver Function Tests: Recent Labs    09/14/19 0500  ALBUMIN 5.0   No results for input(s): LIPASE, AMYLASE in the last 8760 hours. No results for input(s): AMMONIA in the last 8760 hours. CBC: Recent Labs    09/14/19 0500  WBC 5.9  HGB 14.8  HCT 43  PLT 239   Lipid Panel: Recent Labs    09/14/19 0500 03/14/20 0000  CHOL 212* 199  HDL  --  50  LDLCALC 106 111  TRIG 211* 190*   Lab Results  Component Value Date   HGBA1C 6.8 03/14/2020    Assessment/Plan 1. Diarrhea due to COVID-19 -resolved  2. Primary osteoarthritis involving multiple joints -stable, improved off exemestane, now on tamoxifen -cont regular exercise   3. Aromatase inhibitor-associated  arthralgia -off exemestane and now on tamoxifen  4. Essential hypertension -bp at goal with current regimen, cont same and monitor  5. Prediabetes -waiting on labs to be abstracted from before visit, hopefully improved with dietary changes and ongoing regular exercise  6. Hyperlipidemia LDL goal <100 -await abstracted labs, cont same pending that  7. Bilateral malignant neoplasm of breast in female, unspecified estrogen receptor status, unspecified site of breast [facility]) -now on tamoxifen, is s/p bilateral mastectomies, followed by Dr. Burr Medico here   8. History of colon polyps - reports being due in July for cscope--last was in [place] - Ambulatory referral to Gastroenterology  Labs/tests ordered:  No new unless abnormal results when this set abstracted from last week  Next appt:  6 mos med [place]. Jaquavis Felmlee, D.O. [facility] [address], [place] [address] Cell Phone (Mon-Fri 8am-5pm):  [phone] On Call:  [phone] & follow prompts after 5pm & weekends Office Phone:  [phone] Office Fax:  [phone]

## 2020-09-10 NOTE — Telephone Encounter (Signed)
A user error has taken place: encounter opened in error, closed for administrative reasons.

## 2020-09-11 ENCOUNTER — Telehealth: Payer: Self-pay

## 2020-09-11 NOTE — Telephone Encounter (Signed)
Sugar average 6.7 up into diabetic range, decrease sweets, starchy carbs and wine. Try to lower this and avoid medications. Bad cholesterol ok. Tryglcerides (starchy fats) elevated also- same diet changes should help them.

## 2020-09-11 NOTE — Telephone Encounter (Signed)
Electrolytes ok. 1 liver test slightly elevated (rest ok) - monitor blood. Results given to paient

## 2020-09-19 NOTE — Telephone Encounter (Signed)
Hi Dr. Tarri Glenn,  We have received colon[facility] report [facility] [place] [facility].  Attached are records for patient colonoscopy in 2016.Marland Kitchen  Please advise on scheduling.

## 2020-09-20 NOTE — Telephone Encounter (Signed)
Records reviewed.  I previously reviewed a colonoscopy report from 2016 by Dr. Chancy Milroy. Today I've reviewed an additional colonoscopy performed by Dr. Patrick Jupiter at the [facility] of [place] 03/14/2018 revealing 2 polyps at the hepatic flexure, 2 polyps at the splenic flexure, and a polyp in the sigmoid colon.  There was diverticulosis. Pathology results were not included. Surveillance interval not specified.  Scheduling: Please obtain pathology results and/or recommendations for surveillance from Dr. Lurline Del with the 2019 colonoscopy.  Thank you.

## 2020-09-27 NOTE — Telephone Encounter (Signed)
Good morning Dr. Tarri Glenn, we received additional records for patient.  Will be sending them to you for review and advise and scheduling please.

## 2020-09-30 NOTE — Telephone Encounter (Addendum)
Called Dr. Junita Push Lorber's office to request last pathology report and recommended surveillance interval .  Awaiting records.

## 2020-09-30 NOTE — Telephone Encounter (Signed)
Additional records reviewed.  I previously reviewed a colonoscopy report from 2016 by Dr. Chancy Milroy. Today I've reviewed an additional colonoscopy performed by Dr. Patrick Jupiter at the [facility] of [place] 03/14/2018 revealing 2 polyps at the hepatic flexure, 2 polyps at the splenic flexure, and a polyp in the sigmoid colon.  There was diverticulosis. Pathology results were not included. Surveillance interval not specified.  Unfortunately, records from pathology results or previously recommended surveillance interval are still not in the records that I reviewed today.

## 2020-10-02 NOTE — Telephone Encounter (Signed)
Additional records reviewed.  I previously reviewed a colonoscopy report from 2016 by Dr. Chancy Milroy. Today I've reviewed an additional colonoscopy performed by Dr. Patrick Jupiter at the [facility] of [place] 03/14/2018 revealing 2 polyps at the hepatic flexure, 2 polyps at the splenic flexure, and a polyp in the sigmoid colon.  There was diverticulosis. Pathology results showed one tubular adenoma at the splenic flexure. The other polyps were hyperplastic.   Without a personal history of cancer, a family history of colon cancer/polyps, or a history of colitis (None of this history is available to me with the records provided), she would be due a surveillance colonoscopy no earlier than 2024. And, potentially 2026. We would proceed earlier with a personal/family history or with new GI symptoms. Thanks.

## 2020-10-02 NOTE — Telephone Encounter (Signed)
God morning Dr. Tarri Glenn, called Dr. Roxy Horseman office to obtain records, only received last pathology report.

## 2020-10-03 NOTE — Telephone Encounter (Signed)
Good morning Dr. Tarri Glenn, I spoke with the patient and she did inform me she has no personal history of cancer.  Mother did have colon cancer, no other family history.

## 2020-10-03 NOTE — Telephone Encounter (Signed)
Thank you. With that information, the next colonoscopy would be recommended in 2024, earlier with any new symptoms. Thanks.

## 2020-10-04 ENCOUNTER — Ambulatory Visit: Payer: Managed Care, Other (non HMO) | Admitting: Obstetrics & Gynecology

## 2020-10-04 ENCOUNTER — Other Ambulatory Visit: Payer: Self-pay

## 2020-10-04 ENCOUNTER — Encounter: Payer: Self-pay | Admitting: Obstetrics & Gynecology

## 2020-10-04 VITALS — BP 130/80 | Ht 66.0 in | Wt 179.0 lb

## 2020-10-04 DIAGNOSIS — Z01419 Encounter for gynecological examination (general) (routine) without abnormal findings: Secondary | ICD-10-CM | POA: Diagnosis not present

## 2020-10-04 DIAGNOSIS — Z170 Estrogen receptor positive status [ER+]: Secondary | ICD-10-CM

## 2020-10-04 DIAGNOSIS — C50919 Malignant neoplasm of unspecified site of unspecified female breast: Secondary | ICD-10-CM

## 2020-10-04 DIAGNOSIS — Z780 Asymptomatic menopausal state: Secondary | ICD-10-CM

## 2020-10-04 NOTE — Progress Notes (Signed)
Victoria Wolfe [birth year] [id number]   History:    75 y.o. G2P2L2  Moved recently to [facility].  RP:  Established patient presenting for Annual Gyn exam  HPI:  Postmenopause, well on no HRT.  No PMB.  No pelvic pain.  Abstinent.  C/O vulvar dryness.  Pap Neg 09/2019. Dx of Stage 1 Breast Ca ER pos.  BrCa1-2 Negative.  Had Bilateral Mastectomy and reconstruction in 2020.  On Tamoxifen, started recently.  Colonoscopy 2019 Polyps, on a 3-5 yr schedule.  BD normal in 09/2019.  Health labs with Fam MD.  Past medical history,surgical history, family history and social history were all reviewed and documented in the EPIC chart.  Gynecologic History No LMP recorded. Patient is postmenopausal.  Obstetric History OB History  Gravida Para Term Preterm AB Living  2 2 2    0 2  SAB IAB Ectopic Multiple Live Births  0   0        # Outcome Date GA Lbr Len/2nd Weight Sex Delivery Anes PTL Lv  2 Term           1 Term              ROS: A ROS was performed and pertinent positives and negatives are included in the history.  GENERAL: No fevers or chills. HEENT: No change in vision, no earache, sore throat or sinus congestion. NECK: No pain or stiffness. CARDIOVASCULAR: No chest pain or pressure. No palpitations. PULMONARY: No shortness of breath, cough or wheeze. GASTROINTESTINAL: No abdominal pain, nausea, vomiting or diarrhea, melena or bright red blood per rectum. GENITOURINARY: No urinary frequency, urgency, hesitancy or dysuria. MUSCULOSKELETAL: No joint or muscle pain, no back pain, no recent trauma. DERMATOLOGIC: No rash, no itching, no lesions. ENDOCRINE: No polyuria, polydipsia, no heat or cold intolerance. No recent change in weight. HEMATOLOGICAL: No anemia or easy bruising or bleeding. NEUROLOGIC: No headache, seizures, numbness, tingling or weakness. PSYCHIATRIC: No depression, no loss of interest in normal activity or change in sleep pattern.     Exam:   BP 130/80  (BP Location: Right Arm, Patient Position: Sitting, Cuff Size: Normal)   Ht 5' 6"  (1.676 m)   Wt 179 lb (81.2 kg)   BMI 28.89 kg/m   Body mass index is 28.89 kg/m.  General appearance : Well developed well nourished female. No acute distress HEENT: Eyes: no retinal hemorrhage or exudates,  Neck supple, trachea midline, no carotid bruits, no thyroidmegaly Lungs: Clear to auscultation, no rhonchi or wheezes, or rib retractions  Heart: Regular rate and rhythm, no murmurs or gallops Breast:Examined in sitting and supine position were symmetrical in appearance, no palpable masses or tenderness,  no skin retraction, no nipple inversion, no nipple discharge, no skin discoloration, no axillary or supraclavicular lymphadenopathy Abdomen: no palpable masses or tenderness, no rebound or guarding Extremities: no edema or skin discoloration or tenderness  Pelvic: Vulva: Normal             Vagina: No gross lesions or discharge  Cervix: No gross lesions or discharge  Uterus  AV, normal size, shape and consistency, non-tender and mobile  Adnexa  Without masses or tenderness  Anus: Normal   Assessment/Plan:  75 y.o. female for annual exam   1. Well female exam with routine gynecological exam Normal gynecologic exam in menopause.  Pap Neg 09/2019, no indication to repeat a Pap test at this time.  Colono 2019, will repeat at 3-5 yrs.  Health labs with  Fam MD.  BMI 28.89.  On a low Calorie/Carb diet.  Exercising regularly.  2. Postmenopause Well on no HRT.  No PMB.  Bone Density 09/2019 Normal.  C/O vulvar dryness.  Recommend Coconut Oil.  3. Malignant neoplasm of breast in female, estrogen receptor positive, unspecified laterality, unspecified site of breast ([place]) S/P Bilateral Mastectomy/Reconstruction.  Started on Tamoxifen.  No PMB.  Counseling on management of PMB on Tamoxifen, will call if develops PMB, would proceed with Pelvic US/EBx.  Princess Bruins MD, 9:34 AM 10/04/2020

## 2020-10-11 ENCOUNTER — Encounter: Payer: Self-pay | Admitting: Family

## 2020-10-11 ENCOUNTER — Ambulatory Visit: Payer: Managed Care, Other (non HMO) | Admitting: Family

## 2020-10-11 ENCOUNTER — Other Ambulatory Visit: Payer: Self-pay

## 2020-10-11 VITALS — BP 138/90 | HR 83 | Temp 97.7°F | Resp 16 | Ht 66.0 in | Wt 177.8 lb

## 2020-10-11 DIAGNOSIS — R399 Unspecified symptoms and signs involving the genitourinary system: Secondary | ICD-10-CM | POA: Diagnosis not present

## 2020-10-11 LAB — POCT URINALYSIS DIPSTICK
Glucose, UA: NEGATIVE
Nitrite, UA: POSITIVE
Protein, UA: POSITIVE — AB
Spec Grav, UA: >=1.03 — AB (ref 1.010–1.025)
Urobilinogen, UA: NEGATIVE E.U./dL — AB
pH, UA: 5 (ref 5.0–8.0)

## 2020-10-11 MED ORDER — CIPROFLOXACIN HCL 500 MG PO TABS: 500.0000 mg | ORAL_TABLET | Freq: Two times a day (BID) | ORAL | 0 refills | Status: DC

## 2020-10-11 NOTE — Patient Instructions (Addendum)
- Cipro 500 mg tablet one by mouth twice daily x 7 days  - continue with yourgut  daily  Urinary Tract Infection, Adult A urinary tract infection (UTI) is an infection of any part of the urinary tract. The urinary tract includes:  The kidneys.  The ureters.  The bladder.  The urethra. These organs make, store, and get rid of pee (urine) in the body. What are the causes? This infection is caused by germs (bacteria) in your genital area. These germs grow and cause swelling (inflammation) of your urinary tract. What increases the risk? The following factors may make you more likely to develop this condition:  Using a small, thin tube (catheter) to drain pee.  Not being able to control when you pee or poop (incontinence).  Being female. If you are female, these things can increase the risk: ? Using these methods to prevent pregnancy:  A medicine that kills sperm (spermicide).  A device that blocks sperm (diaphragm). ? Having low levels of a female hormone (estrogen). ? Being pregnant. You are more likely to develop this condition if:  You have genes that add to your risk.  You are sexually active.  You take antibiotic medicines.  You have trouble peeing because of: ? A prostate that is bigger than normal, if you are female. ? A blockage in the part of your body that drains pee from the bladder. ? A kidney stone. ? A nerve condition that affects your bladder. ? Not getting enough to drink. ? Not peeing often enough.  You have other conditions, such as: ? Diabetes. ? A weak disease-fighting system (immune system). ? Sickle cell disease. ? Gout. ? Injury of the spine. What are the signs or symptoms? Symptoms of this condition include:  Needing to pee right away.  Peeing small amounts often.  Pain or burning when peeing.  Blood in the pee.  Pee that smells bad or not like normal.  Trouble peeing.  Pee that is cloudy.  Fluid coming from the vagina, if you are  female.  Pain in the belly or lower back. Other symptoms include:  Vomiting.  Not feeling hungry.  Feeling mixed up (confused). This may be the first symptom in older adults.  Being tired and grouchy (irritable).  A fever.  Watery poop (diarrhea). How is this treated?  Taking antibiotic medicine.  Taking other medicines.  Drinking enough water. In some cases, you may need to see a specialist. Follow these instructions at home: Medicines  Take over-the-counter and prescription medicines only as told by your doctor.  If you were prescribed an antibiotic medicine, take it as told by your doctor. Do not stop taking it even if you start to feel better. General instructions  Make sure you: ? Pee until your bladder is empty. ? Do not hold pee for a long time. ? Empty your bladder after sex. ? Wipe from front to back after peeing or pooping if you are a female. Use each tissue one time when you wipe.  Drink enough fluid to keep your pee pale yellow.  Keep all follow-up visits.   Contact a doctor if:  You do not get better after 1-2 days.  Your symptoms go away and then come back. Get help right away if:  You have very bad back pain.  You have very bad pain in your lower belly.  You have a fever.  You have chills.  You feeling like you will vomit or you vomit. Summary  A urinary tract infection (UTI) is an infection of any part of the urinary tract.  This condition is caused by germs in your genital area.  There are many risk factors for a UTI.  Treatment includes antibiotic medicines.  Drink enough fluid to keep your pee pale yellow. This information is not intended to replace advice given to you by your health care provider. Make sure you discuss any questions you have with your health care provider. Document Revised: 03/22/2020 Document Reviewed: 03/22/2020 Elsevier Patient Education  [place].

## 2020-10-11 NOTE — Progress Notes (Signed)
Provider: Daliah Chaudoin FNP-C  Gayland Curry, DO  Patient Care Team: Gayland Curry, DO as PCP - General (Geriatric Medicine)  Extended Emergency Contact Information Primary Emergency Contact: [place] Mobile Phone: [phone] Relation: Sister  Code Status: Full Code  Goals of care: Advanced Directive information Advanced Directives 10/11/2020  Does Patient Have a Medical Advance Directive? Yes  Type of Paramedic of [place];Living will  Does patient want to make changes to medical advance directive? No - Patient declined  Copy of [place] in Chart? Yes - validated most recent copy scanned in chart (See row information)  Pre-existing out of facility DNR order (yellow form or pink MOST form) -     Chief Complaint  Patient presents with  . Acute Visit    Complains of frequent urination/blood in urine.    HPI:  Pt is a 75 y.o. female seen today for an acute visit for evaluation of urine frequency and burning sensation x 4 days.Noticed significant blood in the urine this morning.No fever,chills,abdominal pain.Has chronic lower back pain at night.Thinks it's from the mattress.Appetite is good on a choice diet.   Past Medical History:  Diagnosis Date  . Arthritis    Per [facility] New Patient Packet   . Bilateral malignant neoplasm of breast in female [facility]) 10/18/2018  . Breast cancer [facility])    Per [place] New Patient Packet   . Essential hypertension 10/25/2015  . High blood pressure    Per [place] Patient Packet   . High cholesterol    Per [place] New Patient Packet   . Hyperlipidemia LDL goal <130 10/25/2015  . Prediabetes 11/19/2017   Past Surgical History:  Procedure Laterality Date  . CATARACT EXTRACTION  2018   Per [place] New Patient Packet, [place]     x2  . COLONOSCOPY  2018   Per [place] new patient packet, Dr.Locker  . MASTECTOMY Bilateral 02/02/2019   Per [place] New Patient Packet, DrMichele  Blackwood and Dr.Colon   . MENISCECTOMY Right 2009  . MENISCUS REPAIR  2004   Per [place] Patient Packet, Dr. Milagros Reap  . TONSILLECTOMY  1951   Per [facility] New Patient Packet    No Known Allergies  Outpatient Encounter Medications as of 10/11/2020  Medication Sig  . Azelastine-Fluticasone 137-50 MCG/ACT SUSP PLACE 1 SPRAY INTO BOTH NOSTRILS DAILY.  . candesartan-hydrochlorothiazide (ATACAND HCT) 32-12.5 MG tablet Take 1 tablet by mouth daily.  . Cholecalciferol (VITAMIN D3) 50 MCG (2000 UT) capsule Take 2,000 Units by mouth daily.  . Cyanocobalamin (VITAMIN B-12) 2500 MCG SUBL   . esomeprazole (NEXIUM) 20 MG capsule Take 20 mg by mouth daily at 12 noon.  . Glucosamine 500 MG CAPS Take by mouth daily.  Marland Kitchen loperamide (IMODIUM A-D) 2 MG tablet Take 1 tablet (2 mg total) by mouth 4 (four) times daily as needed for diarrhea or loose stools.  Vladimir Faster Glycol-Propyl Glycol (SYSTANE) 0.4-0.3 % SOLN Apply to eye daily.  . psyllium (METAMUCIL) 58.6 % packet Take 1 packet by mouth daily.  . rosuvastatin (CRESTOR) 20 MG tablet Take 1 tablet (20 mg total) by mouth daily.  . tamoxifen (NOLVADEX) 20 MG tablet TAKE 1 TABLET BY MOUTH EVERY DAY  . UNABLE TO FIND Magnalife Gel Apply to joints 2-3 times daily as needed.   No facility-administered encounter medications on file as of 10/11/2020.    Review of Systems  Constitutional: Negative for appetite change, chills, fatigue  and fever.  Respiratory: Negative for cough, chest tightness, shortness of breath and wheezing.   Cardiovascular: Negative for chest pain, palpitations and leg swelling.  Gastrointestinal: Negative for abdominal distention, abdominal pain, constipation, diarrhea, nausea and vomiting.  Genitourinary: Positive for dysuria, frequency and hematuria. Negative for difficulty urinating, flank pain and urgency.  Skin: Negative for color change, pallor and rash.  Psychiatric/Behavioral: Negative for agitation, confusion and sleep disturbance.  The patient is not nervous/anxious.     Immunization History  Administered Date(s) Administered  . Influenza Split 06/24/2014  . Influenza, High Dose Seasonal PF 05/29/2013  . Influenza,inj,Quad PF,6+ Mos 06/10/2015, 05/01/2016, 05/10/2017, 06/01/2018  . Influenza-Unspecified 05/24/2013, 06/24/2014, 06/10/2015, 05/01/2016, 06/01/2018, 05/18/2019, 05/25/2020  . Moderna Sars-Covid-2 Vaccination 09/10/2019, 10/03/2019, 06/25/2020  . Pneumococcal Conjugate-13 08/25/2015, 10/25/2015  . Pneumococcal Polysaccharide-23 03/06/2013  . Tdap 08/27/2014  . Zoster 11/03/2010  . Zoster Recombinat (Shingrix) 11/16/2017, 03/22/2018   Pertinent  Health Maintenance Due  Topic Date Due  . MAMMOGRAM  09/16/2020  . COLONOSCOPY (Pts 45-71yrs Insurance coverage will need to be confirmed)  03/14/2021  . INFLUENZA VACCINE  Completed  . DEXA SCAN  Completed  . PNA vac Low Risk Adult  Completed   Fall Risk  10/11/2020 09/04/2020 08/22/2020 03/20/2020 11/01/2019  Falls in the past year? 0 0 0 0 0  Number falls in past yr: 0 0 0 0 0  Injury with Fall? 0 0 0 0 0   Functional Status Survey:    Vitals:   10/11/20 1020  BP: 138/90  Pulse: 83  Resp: 16  Temp: 97.7 F (36.5 C)  SpO2: 98%  Weight: 177 lb 12.8 oz (80.6 kg)  Height: 5\' 6"  (1.676 m)   Body mass index is 28.7 kg/m. Physical Exam Vitals reviewed.  Constitutional:      General: She is not in acute distress.    Appearance: She is overweight. She is not ill-appearing.  Cardiovascular:     Rate and Rhythm: Normal rate and regular rhythm.     Pulses: Normal pulses.     Heart sounds: Normal heart sounds. No murmur heard. No friction rub. No gallop.   Pulmonary:     Effort: Pulmonary effort is normal. No respiratory distress.     Breath sounds: Normal breath sounds. No wheezing, rhonchi or rales.  Chest:     Chest wall: No tenderness.  Abdominal:     General: Bowel sounds are normal. There is no distension.     Palpations: Abdomen is soft.  There is no mass.     Tenderness: There is no abdominal tenderness. There is no right CVA tenderness, left CVA tenderness, guarding or rebound.  Neurological:     Mental Status: She is alert and oriented to person, place, and time.     Motor: No weakness.     Gait: Gait normal.  Psychiatric:        Mood and Affect: Mood normal.        Behavior: Behavior normal.        Thought Content: Thought content normal.        Judgment: Judgment normal.    Labs reviewed: Recent Labs    08/29/20 0000  NA 140  K 4.2  CL 103  CO2 21  BUN 12  CREATININE 0.6  CALCIUM 9.2   Recent Labs    08/29/20 0000  ALT 43*  ALBUMIN 4.3   Recent Labs    08/29/20 0000  WBC 5.9  HGB 13.7  HCT 39  PLT 219   Lab Results  Component Value Date   TSH 1.43 11/16/2017   Lab Results  Component Value Date   HGBA1C 6.8 03/14/2020   Lab Results  Component Value Date   CHOL 199 03/14/2020   HDL 50 03/14/2020   LDLCALC 111 03/14/2020   TRIG 190 (A) 03/14/2020    Significant Diagnostic Results in last 30 days:  No results found.  Assessment/Plan   Symptoms of urinary tract infection Afebrile. Reports increased urine frequency and burning sensation. - POC Urinalysis Dipstick indicates yellow cloudy urine with moderate Ketones,positive protein,nitrites and large leukocytes.  - encouraged to increase water intake  - Discussed option to send urine for culture and recommended cranberry tablet.would like to go ahead and start on broad spetrum antibiotics while waiting for the final urine culture.she aware will need to change antibiotics if not sensitive to current antibiotic. Has take Cipro in the past which work good for her. - Cipro ordered as below.Additional education information provided on AVS.  - Culture, Urine - ciprofloxacin (CIPRO) 500 MG tablet; Take 1 tablet (500 mg total) by mouth 2 (two) times daily.  Dispense: 7 tablet; Refill: 0  Family/ staff Communication: Reviewed plan of care  with patient verbalized understanding.  Labs/tests ordered:  - POC Urinalysis Dipstick - Culture, Urine  Next Appointment: As needed if symptoms worsen or fail to improve.   Sandrea Hughs, NP

## 2020-10-12 LAB — URINE CULTURE
MICRO NUMBER:: 11553143
SPECIMEN QUALITY:: ADEQUATE

## 2020-10-14 ENCOUNTER — Encounter: Payer: Self-pay | Admitting: Internal Medicine

## 2020-10-14 ENCOUNTER — Telehealth: Payer: Self-pay | Admitting: *Deleted

## 2020-10-14 DIAGNOSIS — R399 Unspecified symptoms and signs involving the genitourinary system: Secondary | ICD-10-CM

## 2020-10-14 MED ORDER — CIPROFLOXACIN HCL 500 MG PO TABS: 500.0000 mg | ORAL_TABLET | Freq: Two times a day (BID) | ORAL | 0 refills | Status: DC

## 2020-10-14 NOTE — Telephone Encounter (Signed)
Patient called and stated that she was seen for UTI. Stated that she received the Urine Culture results on MyChart but she does not understand them.   1. Wants to know the results from her Urine Culture.   2. Was given Cipro 500mg  and was told to take one tablet twice daily for a week but was only given #7 tablets. Took 2 Friday, 2 Saturday, 2 Sunday and only has 1 left.   Please Advise.

## 2020-10-14 NOTE — Telephone Encounter (Signed)
Refill an additional 7 tablets to complete 7 days.No need to recollect urine since she is on antibiotics. See urine culture results note.

## 2020-10-14 NOTE — Telephone Encounter (Signed)
LMOM to return call.

## 2020-10-14 NOTE — Telephone Encounter (Signed)
Patient notified and agreed. Rx sent to Pharmacy per [place].

## 2020-10-14 NOTE — Telephone Encounter (Signed)
Patient called to return Anita's call from earlier and wanted to know about the directions of the medication order it says to take 1 tablet twice a day for 7 days and you only prescribed her 7 pills she wants to know how she is to take pills twice a day if you only give her 7  Please Advise

## 2020-10-23 ENCOUNTER — Encounter (INDEPENDENT_AMBULATORY_CARE_PROVIDER_SITE_OTHER): Payer: Managed Care, Other (non HMO) | Admitting: Ophthalmology

## 2020-10-23 ENCOUNTER — Other Ambulatory Visit: Payer: Self-pay

## 2020-10-23 DIAGNOSIS — H35033 Hypertensive retinopathy, bilateral: Secondary | ICD-10-CM

## 2020-10-23 DIAGNOSIS — H43813 Vitreous degeneration, bilateral: Secondary | ICD-10-CM | POA: Diagnosis not present

## 2020-10-23 DIAGNOSIS — H33103 Unspecified retinoschisis, bilateral: Secondary | ICD-10-CM

## 2020-10-23 DIAGNOSIS — I10 Essential (primary) hypertension: Secondary | ICD-10-CM | POA: Diagnosis not present

## 2020-11-22 NOTE — Progress Notes (Signed)
[place]   [phone] Fax:([phone]   Clinic Follow up Note   Patient Care Team: Gayland Curry, DO as PCP - General (Geriatric Medicine)  Date of Service:  11/27/2020  CHIEF COMPLAINT: F/u of left breast cancer   SUMMARY OF ONCOLOGIC HISTORY: Oncology History Overview Note  Cancer Staging Cancer of overlapping sites of left breast [facility]) Staging form: Breast, AJCC 8th Edition - Clinical stage from 09/27/2018: Stage IA (cT1b, cN0, cM0, G2, ER+, PR+, HER2-) - Signed by Truitt Merle, MD on 12/07/2019 - Pathologic stage from 02/02/2019: Stage IA (pT1b, pN0, cM0, G2, ER+, PR+, HER2-) - Signed by Truitt Merle, MD on 12/07/2019     Cancer of overlapping sites of left breast ([place])  09/27/2018 Cancer Staging   Staging form: Breast, AJCC 8th Edition - Clinical stage from 09/27/2018: Stage IA (cT1b, cN0, cM0, G2, ER+, PR+, HER2-) - Signed by Truitt Merle, MD on 12/07/2019   10/04/2018 Mammogram   10/04/18 B/l mammogram - multifocal malignancy left breast with one prominent axillary node. Lesions are seperated by at least 3cm, but additional disease is not excluded indeterminate right enhancement-additional spot mammographic imaging and [place] advised to locate for sampling. Birads 6.   10/07/2018 Mammogram   10/07/18 - mammogram suspicious of malignancy. the nodule in the right breast middle depth lateral region seen on the craniocausal view only is at alow suspicion for malignancy. a stereotactic biopsy is reocmnended. Birads 4a low suspicions for malignancy    10/07/2018 Breast US   10/07/18 B/L [place] - known biopsy proven malignancy. The mass in the lift breast at 11: 00 positoin psterior depth is a known biopsy positive for malignancy. Birads 6 known malignancy    10/18/2018 Initial Diagnosis   Bilateral malignant neoplasm of breast in female [facility])   10/19/2018 Initial Biopsy   09/27/2018 Left breast biopsy  -11:00. 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     2020 Genetic Testing   Genetc testing negative    11/2018 - 01/2019 Neo-Adjuvant Anti-estrogen oral therapy   Neoadjuvant Anastrozole once daily for 6-8 weeks and had great response to treatment. Did not tolerate well.    02/02/2019 Surgery   B/l total mastectomy with Sentinel Node Biopsy by Dr. Pasty Arch 12/08/18 PATHOLOGY: 1. 2 left axillary sentinel lymph node were negative for malignant cells. 2. Left breast mastectomy: Invasive and in situ ductal carcinoma, 76m, associated with metal clip and prior biopsy site change, at the 11:00-12:00 position; a second metal clip is present at the 8:00 to 9 o'clock position, no evidence of malignancy 3. Right breast mastectomy: Benign breast tissue   02/02/2019 Cancer Staging   Staging form: Breast, AJCC 8th Edition - Pathologic stage from 02/02/2019: Stage IA (pT1b, pN0, cM0, G2, ER+, PR+, HER2-) - Signed by FTruitt Merle MD on 12/07/2019   02/2019 -  Anti-estrogen oral therapy   Letrozole once daily for 2 months since the end of 02/2019-04/2019. Did not tolerate well and was switched to Exemestane once daily in 04/2019. Switched to Tamoxifen in 05/2020 due to worsened joint pain.    06/30/2019 Surgery   B/l Breast Reconstruction with Dr. CGreig Castilla11/6/20   10/17/2019 Imaging   DEXA ASSESSMENT: The BMD measured at Femur Neck Left is 0.982 g/cm2 with a T-score of -0.4. This patient  is considered normal according to [facility]) criteria.      CURRENT THERAPY:  Letrozole once daily for 2 months since the end of 02/2019-04/2019. Did not tolerate well and was switched to Exemestane once daily in 04/2019. Switched to Tamoxifen in 05/2020 due to worsened joint pain.   INTERVAL HISTORY:  Victoria Wolfe is here for a follow up of b/l breast  cancer. She was last seen by me 6 months ago. She presents to the clinic alone. She notes she feels much better on Tamoxifen now. She no longer her exacerbated joint pain and energy. She does have more hot flashes and night sweats with Tamoxifen. She is able to lose weight but harder on Tamoxifen. She is getting enough sleep. She notes recent groin pain, she plans to f/u with her PCP about this soon. She notes her right chest skin lesion has been there for 8 years and prior biopsy was benign.     REVIEW OF SYSTEMS:   Constitutional: Denies fevers, chills or abnormal weight loss Eyes: Denies blurriness of vision Ears, nose, mouth, throat, and face: Denies mucositis or sore throat Respiratory: Denies cough, dyspnea or wheezes Cardiovascular: Denies palpitation, chest discomfort or lower extremity swelling Gastrointestinal:  Denies nausea, heartburn or change in bowel habits Skin: Denies abnormal skin rashes MSK: (+) groin pain  Lymphatics: Denies new lymphadenopathy or easy bruising Neurological:Denies numbness, tingling or new weaknesses Behavioral/Psych: Mood is stable, no new changes  All other systems were reviewed with the patient and are negative.   MEDICAL HISTORY:  Past Medical History:  Diagnosis Date  . Arthritis    Per [facility] New Patient Packet   . Bilateral malignant neoplasm of breast in female [facility]) 10/18/2018  . Breast cancer [facility])    Per [place] New Patient Packet   . Essential hypertension 10/25/2015  . High blood pressure    Per [place] Patient Packet   . High cholesterol    Per [place] New Patient Packet   . Hyperlipidemia LDL goal <130 10/25/2015  . Prediabetes 11/19/2017    SURGICAL HISTORY: Past Surgical History:  Procedure Laterality Date  . CATARACT EXTRACTION  2018   Per [place] New Patient Packet, [place]     x2  . COLONOSCOPY  2018   Per [place] new patient packet, Dr.Locker  . MASTECTOMY Bilateral 02/02/2019   Per [place] New Patient  Packet, DrMichele Blackwood and Dr.Colon   . MENISCECTOMY Right 2009  . MENISCUS REPAIR  2004   Per [place] Patient Packet, Dr. Milagros Reap  . TONSILLECTOMY  1951   Per [place] New Patient Packet    I have reviewed the social history and family history with the patient and they are unchanged from previous note.  ALLERGIES:  has No Known Allergies.  MEDICATIONS:  Current Outpatient Medications  Medication Sig Dispense Refill  . Azelastine-Fluticasone 137-50 MCG/ACT SUSP PLACE 1 SPRAY INTO BOTH NOSTRILS DAILY. 1 g 1  . candesartan-hydrochlorothiazide (ATACAND HCT) 32-12.5 MG tablet Take 1 tablet by mouth daily. 90 tablet 3  . Cholecalciferol (VITAMIN D3) 50 MCG (2000 UT) capsule Take 2,000 Units by mouth daily.    . ciprofloxacin (CIPRO) 500 MG tablet Take 1 tablet (500 mg total) by mouth 2 (two) times daily. 7 tablet 0  . Cyanocobalamin (VITAMIN B-12) 2500 MCG SUBL     . esomeprazole (NEXIUM) 20 MG capsule Take 20 mg by mouth daily at 12 noon.    . Glucosamine 500 MG  CAPS Take by mouth daily.    Marland Kitchen loperamide (IMODIUM A-D) 2 MG tablet Take 1 tablet (2 mg total) by mouth 4 (four) times daily as needed for diarrhea or loose stools. 30 tablet 0  . Polyethyl Glycol-Propyl Glycol (SYSTANE) 0.4-0.3 % SOLN Apply to eye daily.    . psyllium (METAMUCIL) 58.6 % packet Take 1 packet by mouth daily.    . rosuvastatin (CRESTOR) 20 MG tablet Take 1 tablet (20 mg total) by mouth daily. 90 tablet 3  . tamoxifen (NOLVADEX) 20 MG tablet Take 1 tablet (20 mg total) by mouth daily. 90 tablet 1  . UNABLE TO FIND Magnalife Gel Apply to joints 2-3 times daily as needed.     No current facility-administered medications for this visit.    PHYSICAL EXAMINATION: ECOG PERFORMANCE STATUS: 0 - Asymptomatic  Vitals:   11/27/20 1333  BP: (!) 149/95  Pulse: 96  Resp: 17  Temp: 98.9 F (37.2 C)  SpO2: 96%   Filed Weights   11/27/20 1333  Weight: 179 lb 3.2 oz (81.3 kg)    GENERAL:alert, no distress and  comfortable SKIN: skin color, texture, turgor are normal, no rashes or significant lesions (+) Known Right chest skin lesion EYES: normal, Conjunctiva are pink and non-injected, sclera clear  NECK: supple, thyroid normal size, non-tender, without nodularity LYMPH:  no palpable lymphadenopathy in the cervical, axillary  LUNGS: clear to auscultation and percussion with normal breathing effort HEART: regular rate & rhythm and no murmurs and no lower extremity edema ABDOMEN:abdomen soft, non-tender and normal bowel sounds Musculoskeletal:no cyanosis of digits and no clubbing  NEURO: alert & oriented x 3 with fluent speech, no focal motor/sensory deficits  LABORATORY DATA:  I have reviewed the data as listed CBC Latest Ref Rng & Units 11/27/2020 08/29/2020 09/14/2019  WBC 4.0 - 10.5 K/uL 7.9 5.9 5.9  Hemoglobin 12.0 - 15.0 g/dL 12.8 13.7 14.8  Hematocrit 36.0 - 46.0 % 38.1 39 43  Platelets 150 - 400 K/uL 211 219 239     CMP Latest Ref Rng & Units 11/27/2020 08/29/2020 09/14/2019  Glucose 70 - 99 mg/dL 139(H) - -  BUN 8 - 23 mg/dL _0 Creatinine 0.44 - 1.00 mg/dL 0.86 0.6 0.6  Sodium 135 - 145 mmol/L 141 140 141  Potassium 3.5 - 5.1 mmol/L 3.9 4.2 4.5  Chloride 98 - 111 mmol/L 104 103 101  CO2 22 - 32 mmol/L 23 21 27(A)  Calcium 8.9 - 10.3 mg/dL 9.1 9.2 10.5  Total Protein 6.5 - 8.1 g/dL 7.4 - -  Total Bilirubin 0.3 - 1.2 mg/dL 0.5 - -  Alkaline Phos 38 - 126 U/L 59 - -  AST 15 - 41 U/L 30 - -  ALT 0 - 44 U/L 32 43(A) -      RADIOGRAPHIC STUDIES: I have personally reviewed the radiological images as listed and agreed with the findings in the report. No results found.   ASSESSMENT & PLAN:  Tinzlee Craker is a 75 y.o. female with    1.Left multifocal breast Cancer, stage IA(mpT1bN0), ER+/PR+/HER2- -She was diagnosed with stage 1 breast cancer and treated in [place]. She was treated with neoadjuvant anastrozolefor 6-8 weeks, b/l mastectomy and reconstruction with implants.   -She has been on adjuvant AI since 02/2019. She has tried AI with anastrozole, Letrozole and exemestane, but did not tolerate due to joint pain. She was switched to Tamoxifen in 05/2020 and tolerating much better with manageable hot flashes and baseline  arthritis. Loosing weight is slightly harder for her.  -She is clinically doing well. Lab reviewed, her CBC and CMP are within normal limits. Her physical exam was unremarkable. There is no clinical concern for recurrence. -Continue surveillance. Given b/l mastectomy, she does not need mammograms.  -Continue Tamoxifen. She has not had hysterectomy. I recommend she watch for vaginal bleeding.  -F/u in 6 months    2. Comorbidities: HTN, Arthritis with right knee pain, HLD.  -On Crestor, Candesartan/HCTZ. Managed by PCP and Ortho -Arthritis exacerbated on AI and back to baseline on Tamoxifen. She does find it harder for her to lose weight on Tamoxifen. I encouraged her to continue exercise.   3. Bone Health  -Her 09/2019 DEXA was normal (-0.4 T-score).  -On Tamoxifen her bone can strengthen. Continue to monitor her DEXA every 2-3 years.    4. Cancer Screenings  -She is due to colonoscopy in 02/2021 due to polyps on prior procedure.  -I recommend she continue Pap Smears with her Gyn.    PLAN: -Continue Tamoxifen, she is tolerating well overall  -Lab and f/u in 6 months    No problem-specific Assessment & Plan notes found for this encounter.   No orders of the defined types were placed in this encounter.  All questions were answered. The patient knows to call the clinic with any problems, questions or concerns. No barriers to learning was detected. The total time spent in the appointment was 30 minutes.     Truitt Merle, MD 11/27/2020   I, Joslyn Devon, am acting as scribe for Truitt Merle, MD.   I have reviewed the above documentation for accuracy and completeness, and I agree with the above.

## 2020-11-26 ENCOUNTER — Other Ambulatory Visit: Payer: Self-pay | Admitting: Nurse Practitioner

## 2020-11-26 DIAGNOSIS — C50812 Malignant neoplasm of overlapping sites of left female breast: Secondary | ICD-10-CM

## 2020-11-27 ENCOUNTER — Inpatient Hospital Stay: Payer: Managed Care, Other (non HMO) | Attending: Hematology | Admitting: Hematology

## 2020-11-27 ENCOUNTER — Inpatient Hospital Stay: Payer: Managed Care, Other (non HMO)

## 2020-11-27 ENCOUNTER — Encounter: Payer: Self-pay | Admitting: Hematology

## 2020-11-27 ENCOUNTER — Other Ambulatory Visit: Payer: Self-pay

## 2020-11-27 VITALS — BP 149/95 | HR 96 | Temp 98.9°F | Resp 17 | Wt 179.2 lb

## 2020-11-27 DIAGNOSIS — E7800 Pure hypercholesterolemia, unspecified: Secondary | ICD-10-CM | POA: Diagnosis not present

## 2020-11-27 DIAGNOSIS — R232 Flushing: Secondary | ICD-10-CM | POA: Diagnosis not present

## 2020-11-27 DIAGNOSIS — E785 Hyperlipidemia, unspecified: Secondary | ICD-10-CM | POA: Insufficient documentation

## 2020-11-27 DIAGNOSIS — Z9013 Acquired absence of bilateral breasts and nipples: Secondary | ICD-10-CM | POA: Diagnosis not present

## 2020-11-27 DIAGNOSIS — Z79811 Long term (current) use of aromatase inhibitors: Secondary | ICD-10-CM | POA: Diagnosis not present

## 2020-11-27 DIAGNOSIS — C50812 Malignant neoplasm of overlapping sites of left female breast: Secondary | ICD-10-CM | POA: Diagnosis not present

## 2020-11-27 DIAGNOSIS — I10 Essential (primary) hypertension: Secondary | ICD-10-CM | POA: Diagnosis not present

## 2020-11-27 DIAGNOSIS — Z170 Estrogen receptor positive status [ER+]: Secondary | ICD-10-CM | POA: Diagnosis not present

## 2020-11-27 DIAGNOSIS — Z79899 Other long term (current) drug therapy: Secondary | ICD-10-CM | POA: Insufficient documentation

## 2020-11-27 DIAGNOSIS — R61 Generalized hyperhidrosis: Secondary | ICD-10-CM | POA: Insufficient documentation

## 2020-11-27 DIAGNOSIS — M129 Arthropathy, unspecified: Secondary | ICD-10-CM | POA: Insufficient documentation

## 2020-11-27 LAB — CMP (CANCER CENTER ONLY)
ALT: 32 U/L (ref 0–44)
AST: 30 U/L (ref 15–41)
Albumin: 4.1 g/dL (ref 3.5–5.0)
Alkaline Phosphatase: 59 U/L (ref 38–126)
Anion gap: 14 (ref 5–15)
BUN: 19 mg/dL (ref 8–23)
CO2: 23 mmol/L (ref 22–32)
Calcium: 9.1 mg/dL (ref 8.9–10.3)
Chloride: 104 mmol/L (ref 98–111)
Creatinine: 0.86 mg/dL (ref 0.44–1.00)
GFR, Estimated: >60 mL/min (ref >60)
Glucose, Bld: 139 mg/dL — ABNORMAL HIGH (ref 70–99)
Potassium: 3.9 mmol/L (ref 3.5–5.1)
Sodium: 141 mmol/L (ref 135–145)
Total Bilirubin: 0.5 mg/dL (ref 0.3–1.2)
Total Protein: 7.4 g/dL (ref 6.5–8.1)

## 2020-11-27 LAB — CBC WITH DIFFERENTIAL (CANCER CENTER ONLY)
Abs Immature Granulocytes: 0.02 K/uL (ref 0.00–0.07)
Basophils Absolute: 0.1 K/uL (ref 0.0–0.1)
Basophils Relative: 1 %
Eosinophils Absolute: 0.1 K/uL (ref 0.0–0.5)
Eosinophils Relative: 1 %
HCT: 38.1 % (ref 36.0–46.0)
Hemoglobin: 12.8 g/dL (ref 12.0–15.0)
Immature Granulocytes: 0 %
Lymphocytes Relative: 30 %
Lymphs Abs: 2.4 K/uL (ref 0.7–4.0)
MCH: 31.7 pg (ref 26.0–34.0)
MCHC: 33.6 g/dL (ref 30.0–36.0)
MCV: 94.3 fL (ref 80.0–100.0)
Monocytes Absolute: 0.5 K/uL (ref 0.1–1.0)
Monocytes Relative: 6 %
Neutro Abs: 4.9 K/uL (ref 1.7–7.7)
Neutrophils Relative %: 62 %
Platelet Count: 211 K/uL (ref 150–400)
RBC: 4.04 MIL/uL (ref 3.87–5.11)
RDW: 12.8 % (ref 11.5–15.5)
WBC Count: 7.9 K/uL (ref 4.0–10.5)
nRBC: 0 % (ref 0.0–0.2)

## 2020-11-27 MED ORDER — TAMOXIFEN CITRATE 20 MG PO TABS: 20.0000 mg | ORAL_TABLET | Freq: Every day | ORAL | 1 refills | Status: DC

## 2020-11-29 ENCOUNTER — Telehealth: Payer: Self-pay | Admitting: Hematology

## 2020-11-29 NOTE — Telephone Encounter (Signed)
Left message with follow-up appointment per 4/6 los. Gave option to call back to reschedule if needed.

## 2020-12-15 ENCOUNTER — Other Ambulatory Visit: Payer: Self-pay | Admitting: Internal Medicine

## 2020-12-16 ENCOUNTER — Ambulatory Visit (INDEPENDENT_AMBULATORY_CARE_PROVIDER_SITE_OTHER): Payer: Managed Care, Other (non HMO) | Admitting: Family

## 2020-12-16 ENCOUNTER — Other Ambulatory Visit: Payer: Self-pay

## 2020-12-16 ENCOUNTER — Encounter: Payer: Self-pay | Admitting: Family

## 2020-12-16 VITALS — BP 120/82 | HR 85 | Temp 97.1°F | Resp 16 | Ht 66.0 in

## 2020-12-16 DIAGNOSIS — J00 Acute nasopharyngitis [common cold]: Secondary | ICD-10-CM

## 2020-12-16 DIAGNOSIS — R059 Cough, unspecified: Secondary | ICD-10-CM | POA: Diagnosis not present

## 2020-12-16 DIAGNOSIS — R0989 Other specified symptoms and signs involving the circulatory and respiratory systems: Secondary | ICD-10-CM

## 2020-12-16 MED ORDER — DOXYCYCLINE HYCLATE 100 MG PO TABS: 100.0000 mg | ORAL_TABLET | Freq: Two times a day (BID) | ORAL | 0 refills | Status: AC

## 2020-12-16 MED ORDER — SACCHAROMYCES BOULARDII 250 MG PO CAPS: 250.0000 mg | ORAL_CAPSULE | Freq: Two times a day (BID) | ORAL | 0 refills | Status: DC

## 2020-12-16 NOTE — Progress Notes (Addendum)
Provider: Sher Hellinger FNP-C  [name], MD  Patient Care Team: [name], MD as PCP - General (Internal Medicine)  Extended Emergency Contact Information Primary Emergency Contact: [place] Mobile Phone: [phone] Relation: Sister  Code Status:  Full Code  Goals of care: Advanced Directive information Advanced Directives 12/16/2020  Does Patient Have a Medical Advance Directive? Yes  Type of Paramedic of [place];Living will  Does patient want to make changes to medical advance directive? No - Patient declined  Copy of [place] in Chart? Yes - validated most recent copy scanned in chart (See row information)  Pre-existing out of facility DNR order (yellow form or pink MOST form) -     Chief Complaint  Patient presents with  . Acute Visit    Head cold, coughing.     HPI:  Pt is a 75 y.o. female seen today for an acute visit for evaluation of head cold and coughing x since last week on Wednesday.His brother in law had pneumonia.Has had chest tightness.has used mucinex but not getting better.NyQuil.Had diarrhea x 1 on Saturday but none since then.No abdominal pain,N/V  No fever chills,loss of taste or smell     Past Medical History:  Diagnosis Date  . Arthritis    Per [facility] New Patient Packet   . Bilateral malignant neoplasm of breast in female [facility]) 10/18/2018  . Breast cancer [facility])    Per [place] New Patient Packet   . Essential hypertension 10/25/2015  . High blood pressure    Per [place] Patient Packet   . High cholesterol    Per [place] New Patient Packet   . Hyperlipidemia LDL goal <130 10/25/2015  . Prediabetes 11/19/2017   Past Surgical History:  Procedure Laterality Date  . CATARACT EXTRACTION  2018   Per [place] New Patient Packet, [place]     x2  . COLONOSCOPY  2018   Per [place] new patient packet, Dr.Locker  . MASTECTOMY Bilateral 02/02/2019   Per [place] New Patient  Packet, DrMichele Blackwood and Dr.Colon   . MENISCECTOMY Right 2009  . MENISCUS REPAIR  2004   Per [place] Patient Packet, Dr. Milagros Reap  . TONSILLECTOMY  1951   Per [place] New Patient Packet    No Known Allergies  Outpatient Encounter Medications as of 12/16/2020  Medication Sig  . candesartan-hydrochlorothiazide (ATACAND HCT) 32-12.5 MG tablet Take 1 tablet by mouth daily.  . Cholecalciferol (VITAMIN D3) 50 MCG (2000 UT) capsule Take 2,000 Units by mouth daily.  . ciprofloxacin (CIPRO) 500 MG tablet Take 1 tablet (500 mg total) by mouth 2 (two) times daily.  . Cyanocobalamin (VITAMIN B-12) 2500 MCG SUBL   . doxycycline (VIBRA-TABS) 100 MG tablet Take 1 tablet (100 mg total) by mouth 2 (two) times daily for 10 days.  Marland Kitchen esomeprazole (NEXIUM) 20 MG capsule Take 20 mg by mouth daily at 12 noon.  . Glucosamine 500 MG CAPS Take by mouth daily.  Marland Kitchen loperamide (IMODIUM A-D) 2 MG tablet Take 1 tablet (2 mg total) by mouth 4 (four) times daily as needed for diarrhea or loose stools.  Vladimir Faster Glycol-Propyl Glycol (SYSTANE) 0.4-0.3 % SOLN Apply to eye daily.  . psyllium (METAMUCIL) 58.6 % packet Take 1 packet by mouth daily.  . rosuvastatin (CRESTOR) 20 MG tablet Take 1 tablet (20 mg total) by mouth daily.  Marland Kitchen saccharomyces boulardii (FLORASTOR) 250 MG capsule Take 1 capsule (250 mg total) by  mouth 2 (two) times daily.  . tamoxifen (NOLVADEX) 20 MG tablet Take 1 tablet (20 mg total) by mouth daily.  Marland Kitchen UNABLE TO FIND Magnalife Gel Apply to joints 2-3 times daily as needed.  . [DISCONTINUED] Azelastine-Fluticasone 137-50 MCG/ACT SUSP PLACE 1 SPRAY INTO BOTH NOSTRILS DAILY.   No facility-administered encounter medications on file as of 12/16/2020.    Review of Systems  Constitutional: Negative for appetite change, chills, fatigue, fever and unexpected weight change.  HENT: Negative for congestion, dental problem, ear discharge, ear pain, facial swelling, hearing loss, nosebleeds, postnasal drip,  rhinorrhea, sinus pressure, sinus pain, sneezing, sore throat, tinnitus and trouble swallowing.   Eyes: Negative for pain, discharge, redness, itching and visual disturbance.  Respiratory: Positive for cough and chest tightness. Negative for shortness of breath and wheezing.   Cardiovascular: Negative for chest pain, palpitations and leg swelling.       Diarrhea on Saturday but none since then.   Gastrointestinal: Negative for abdominal distention, abdominal pain, nausea and vomiting.  Musculoskeletal: Negative for arthralgias, back pain, gait problem, joint swelling, myalgias, neck pain and neck stiffness.  Skin: Negative for color change, pallor, rash and wound.  Neurological: Negative for dizziness, syncope, speech difficulty, weakness, light-headedness, numbness and headaches.  Hematological: Does not bruise/bleed easily.  Psychiatric/Behavioral: Negative for agitation, behavioral problems, confusion, hallucinations, self-injury, sleep disturbance and suicidal ideas. The patient is not nervous/anxious.     Immunization History  Administered Date(s) Administered  . Influenza Split 06/24/2014  . Influenza, High Dose Seasonal PF 05/29/2013  . Influenza,inj,Quad PF,6+ Mos 06/10/2015, 05/01/2016, 05/10/2017, 06/01/2018  . Influenza-Unspecified 05/24/2013, 06/24/2014, 06/10/2015, 05/01/2016, 06/01/2018, 05/18/2019, 05/25/2020  . Moderna Sars-Covid-2 Vaccination 09/10/2019, 10/03/2019, 06/25/2020, 11/28/2020  . Pneumococcal Conjugate-13 08/25/2015, 10/25/2015  . Pneumococcal Polysaccharide-23 03/06/2013  . Tdap 08/27/2014  . Zoster 11/03/2010  . Zoster Recombinat (Shingrix) 11/16/2017, 03/22/2018   Pertinent  Health Maintenance Due  Topic Date Due  . MAMMOGRAM  09/16/2020  . COLONOSCOPY (Pts 45-16yrs Insurance coverage will need to be confirmed)  03/14/2021  . INFLUENZA VACCINE  03/24/2021  . DEXA SCAN  Completed  . PNA vac Low Risk Adult  Completed   Fall Risk  12/16/2020 10/11/2020  09/04/2020 08/22/2020 03/20/2020  Falls in the past year? 0 0 0 0 0  Number falls in past yr: 0 0 0 0 0  Injury with Fall? 0 0 0 0 0   Functional Status Survey:    Vitals:   12/16/20 0926  BP: 120/82  Pulse: 85  Resp: 16  Temp: (!) 97.1 F (36.2 C)  SpO2: 98%  Height: 5\' 6"  (1.676 m)   Body mass index is 28.92 kg/m. Physical Exam Vitals reviewed.  Constitutional:      General: She is not in acute distress.    Appearance: Normal appearance. She is normal weight. She is not ill-appearing or diaphoretic.  HENT:     Head: Normocephalic.     Right Ear: Tympanic membrane, ear canal and external ear normal. There is no impacted cerumen.     Left Ear: Tympanic membrane, ear canal and external ear normal. There is no impacted cerumen.     Nose: Rhinorrhea present. No congestion.     Mouth/Throat:     Mouth: Mucous membranes are moist.     Pharynx: Oropharynx is clear. No oropharyngeal exudate or posterior oropharyngeal erythema.  Eyes:     General: No scleral icterus.       Right eye: No discharge.  Left eye: No discharge.     Extraocular Movements: Extraocular movements intact.     Conjunctiva/sclera: Conjunctivae normal.     Pupils: Pupils are equal, round, and reactive to light.  Neck:     Vascular: No carotid bruit.  Cardiovascular:     Rate and Rhythm: Normal rate and regular rhythm.     Pulses: Normal pulses.     Heart sounds: Normal heart sounds. No murmur heard. No friction rub. No gallop.   Pulmonary:     Effort: Pulmonary effort is normal. No respiratory distress.     Breath sounds: Examination of the right-middle field reveals rales. Examination of the right-lower field reveals rales. Rales present. No wheezing or rhonchi.  Chest:     Chest wall: No tenderness.  Abdominal:     General: Bowel sounds are normal. There is no distension.     Palpations: Abdomen is soft. There is no mass.     Tenderness: There is no abdominal tenderness. There is no right CVA  tenderness, left CVA tenderness, guarding or rebound.  Musculoskeletal:        General: No swelling or tenderness. Normal range of motion.     Cervical back: Normal range of motion. No rigidity or tenderness.     Right lower leg: No edema.     Left lower leg: No edema.  Lymphadenopathy:     Cervical: No cervical adenopathy.  Neurological:     Mental Status: She is alert and oriented to person, place, and time.     Motor: No weakness.     Gait: Gait normal.  Psychiatric:        Mood and Affect: Mood normal.        Speech: Speech normal.        Behavior: Behavior normal.        Thought Content: Thought content normal.        Judgment: Judgment normal.    Labs reviewed: Recent Labs    08/29/20 0000 11/27/20 1316  NA 140 141  K 4.2 3.9  CL 103 104  CO2 21 23  GLUCOSE  --  139*  BUN 12 19  CREATININE 0.6 0.86  CALCIUM 9.2 9.1   Recent Labs    08/29/20 0000 11/27/20 1316  AST  --  30  ALT 43* 32  ALKPHOS  --  59  BILITOT  --  0.5  PROT  --  7.4  ALBUMIN 4.3 4.1   Recent Labs    08/29/20 0000 11/27/20 1316  WBC 5.9 7.9  NEUTROABS  --  4.9  HGB 13.7 12.8  HCT 39 38.1  MCV  --  94.3  PLT 219 211   Lab Results  Component Value Date   TSH 1.43 11/16/2017   Lab Results  Component Value Date   HGBA1C 6.8 03/14/2020   Lab Results  Component Value Date   CHOL 199 03/14/2020   HDL 50 03/14/2020   LDLCALC 111 03/14/2020   TRIG 190 (A) 03/14/2020    Significant Diagnostic Results in last 30 days:  No results found.  Assessment/Plan 1. Head cold Afebrile.rhinnorrhea.will screen for influenza and COVID-19 as below.  - POC Influenza A/B results negative.  - SARS-COV-2 RNA,(COVID-19) QUAL NAAT  2. Coughing Afebrile.Right lower lung rales with Auscultation.will treat clinically possible PNA.unable to order X-ray waiting for COVID-19  - POC Influenza A/B results negative. - SARS-COV-2 RNA,(COVID-19) QUAL NAAT - doxycycline (VIBRA-TABS) 100 MG tablet; Take  1 tablet (100 mg total) by  mouth 2 (two) times daily for 10 days.  Dispense: 20 tablet; Refill: 0 - saccharomyces boulardii (FLORASTOR) 250 MG capsule; Take 1 capsule (250 mg total) by mouth 2 (two) times daily.  Dispense: 20 capsule; Refill: 0 - Advised to Notify provider or go to ED if symptoms worsen or fail to improve  3. Symptoms of upper respiratory infection (URI) - POC Influenza A/B - SARS-COV-2 RNA,(COVID-19) QUAL NAAT - doxycycline (VIBRA-TABS) 100 MG tablet; Take 1 tablet (100 mg total) by mouth 2 (two) times daily for 10 days.  Dispense: 20 tablet; Refill: 0 - saccharomyces boulardii (FLORASTOR) 250 MG capsule; Take 1 capsule (250 mg total) by mouth 2 (two) times daily.  Dispense: 20 capsule; Refill: 0  Family/ staff Communication: Reviewed plan of care with patient verbalized understanding  Labs/tests ordered:  - POC Influenza A/B - SARS-COV-2 RNA,(COVID-19) QUAL NAAT  Next Appointment: As needed if symptoms worsen or fail to improve    Sandrea Hughs, NP

## 2020-12-16 NOTE — Patient Instructions (Signed)
-   Notify provider if symptoms worsen or fail to improve  °

## 2020-12-17 LAB — POCT INFLUENZA A/B
Influenza A, POC: NEGATIVE
Influenza B, POC: NEGATIVE

## 2020-12-17 LAB — SARS-COV-2 RNA,(COVID-19) QUALITATIVE NAAT: SARS CoV2 RNA: NOT DETECTED

## 2021-02-17 ENCOUNTER — Other Ambulatory Visit: Payer: Self-pay | Admitting: *Deleted

## 2021-02-17 DIAGNOSIS — I10 Essential (primary) hypertension: Secondary | ICD-10-CM

## 2021-02-17 DIAGNOSIS — E785 Hyperlipidemia, unspecified: Secondary | ICD-10-CM

## 2021-02-17 MED ORDER — CANDESARTAN CILEXETIL-HCTZ 32-12.5 MG PO TABS: 1.0000 | ORAL_TABLET | Freq: Every day | ORAL | 3 refills | Status: DC

## 2021-02-17 MED ORDER — ROSUVASTATIN CALCIUM 20 MG PO TABS: 20.0000 mg | ORAL_TABLET | Freq: Every day | ORAL | 3 refills | Status: DC

## 2021-02-17 NOTE — Addendum Note (Signed)
Addended by: Rafael Bihari A on: 02/17/2021 10:00 AM   Modules accepted: Orders

## 2021-02-17 NOTE — Telephone Encounter (Signed)
[facility] requested refill.  

## 2021-02-24 ENCOUNTER — Other Ambulatory Visit: Payer: Self-pay | Admitting: Internal Medicine

## 2021-03-05 ENCOUNTER — Encounter: Payer: Managed Care, Other (non HMO) | Admitting: Internal Medicine

## 2021-03-19 ENCOUNTER — Other Ambulatory Visit: Payer: Self-pay | Admitting: Hematology

## 2021-03-19 ENCOUNTER — Other Ambulatory Visit: Payer: Self-pay

## 2021-03-19 ENCOUNTER — Encounter: Payer: Self-pay | Admitting: Internal Medicine

## 2021-03-19 ENCOUNTER — Non-Acute Institutional Stay: Payer: Medicare Other | Admitting: Internal Medicine

## 2021-03-19 VITALS — BP 136/90 | HR 80 | Temp 97.5°F | Ht 66.0 in | Wt 180.4 lb

## 2021-03-19 DIAGNOSIS — E785 Hyperlipidemia, unspecified: Secondary | ICD-10-CM

## 2021-03-19 DIAGNOSIS — I10 Essential (primary) hypertension: Secondary | ICD-10-CM | POA: Diagnosis not present

## 2021-03-19 DIAGNOSIS — C50812 Malignant neoplasm of overlapping sites of left female breast: Secondary | ICD-10-CM

## 2021-03-19 DIAGNOSIS — C50912 Malignant neoplasm of unspecified site of left female breast: Secondary | ICD-10-CM | POA: Diagnosis not present

## 2021-03-19 DIAGNOSIS — C50911 Malignant neoplasm of unspecified site of right female breast: Secondary | ICD-10-CM | POA: Diagnosis not present

## 2021-03-19 DIAGNOSIS — R7303 Prediabetes: Secondary | ICD-10-CM

## 2021-03-19 DIAGNOSIS — E538 Deficiency of other specified B group vitamins: Secondary | ICD-10-CM

## 2021-03-19 DIAGNOSIS — F5101 Primary insomnia: Secondary | ICD-10-CM

## 2021-03-19 DIAGNOSIS — Z86010 Personal history of colonic polyps: Secondary | ICD-10-CM | POA: Diagnosis not present

## 2021-03-19 DIAGNOSIS — M8949 Other hypertrophic osteoarthropathy, multiple sites: Secondary | ICD-10-CM | POA: Diagnosis not present

## 2021-03-19 DIAGNOSIS — M159 Polyosteoarthritis, unspecified: Secondary | ICD-10-CM

## 2021-03-19 MED ORDER — ROSUVASTATIN CALCIUM 20 MG PO TABS: 20.0000 mg | ORAL_TABLET | Freq: Every day | ORAL | 3 refills | Status: DC

## 2021-03-19 MED ORDER — CANDESARTAN CILEXETIL-HCTZ 32-12.5 MG PO TABS: 1.0000 | ORAL_TABLET | Freq: Every day | ORAL | 3 refills | Status: DC

## 2021-03-19 NOTE — Progress Notes (Addendum)
Location:  [place] of Service:  Clinic (12)  Provider:   Code Status: DNR Goals of Care:  Advanced Directives 03/19/2021  Does Patient Have a Medical Advance Directive? Yes  Type of Advance Directive Living will;Healthcare Power of Attorney  Does patient want to make changes to medical advance directive? No - Patient declined  Copy of [place] in Chart? Yes - validated most recent copy scanned in chart (See row information)  Pre-existing out of facility DNR order (yellow form or pink MOST form) -     Chief Complaint  Patient presents with   Medical Management of Chronic Issues    Patient here today for her 6 month follow up.     HPI: Patient is a 75 y.o. female seen today for medical management of chronic diseases.   Patient has a history of Bilateral mastectomy for breast cancer Has not tolerated aromatase inhibitors due to arthralgia Now on Tamoxifen Follows closely with Dr. Annamaria Boots. States she gets up at night sweating and gets pain all over due to tamoxifen Unable to lose any weight Insomnia Gets to sleep easily but gets up in the middle of the night due to pain and sweating Right knee pain Had steroid injection by Dr. Mariea Clonts year ago.  Wants to know if she should see Ortho for another injection. Other stable issues  Hypertension, prediabetes and diet-controlled, HLD, B12 deficiency  Otherwise very active.  Walks with no assist.  No falls 2 daughters in [place] who are POA  Past Medical History:  Diagnosis Date   Arthritis    Per [facility] New Patient Packet    Bilateral malignant neoplasm of breast in female [facility]) 10/18/2018   Breast cancer ([place])    Per [facility] New Patient Packet    Essential hypertension 10/25/2015   High blood pressure    Per [place] New Patient Packet    High cholesterol    Per [place] New Patient Packet    Hyperlipidemia LDL goal <130 10/25/2015   Prediabetes 11/19/2017    Past Surgical History:   Procedure Laterality Date   CATARACT EXTRACTION  2018   Per [place] New Patient Packet, [place]     x2   COLONOSCOPY  2018   Per [place] new patient packet, Dr.Locker   MASTECTOMY Bilateral 02/02/2019   Per [facility] New Patient Packet, DrMichele Blackwood and Dr.Colon    MENISCECTOMY Right 2009   MENISCUS REPAIR  2004   [facility], Dr. Milagros Reap   TONSILLECTOMY  [phone]   Per [place] Patient Packet    No Known Allergies  Outpatient Encounter Medications as of 03/19/2021  Medication Sig   Azelastine-Fluticasone 137-50 MCG/ACT SUSP INSTILL 1 SPRAY INTO BOTH NOSTRILS DAILY   candesartan-hydrochlorothiazide (ATACAND HCT) 32-12.5 MG tablet Take 1 tablet by mouth daily.   Cholecalciferol (VITAMIN D3) 50 MCG (2000 UT) capsule Take 2,000 Units by mouth daily.   Cyanocobalamin (VITAMIN B-12) 2500 MCG SUBL    esomeprazole (NEXIUM) 20 MG capsule Take 20 mg by mouth daily at 12 noon.   loperamide (IMODIUM A-D) 2 MG tablet Take 1 tablet (2 mg total) by mouth 4 (four) times daily as needed for diarrhea or loose stools.   Polyethyl Glycol-Propyl Glycol (SYSTANE) 0.4-0.3 % SOLN Apply to eye daily.   psyllium (METAMUCIL) 58.6 % packet Take 1 packet by mouth daily.   rosuvastatin (CRESTOR) 20 MG tablet Take 1 tablet (20 mg  total) by mouth daily.   tamoxifen (NOLVADEX) 20 MG tablet Take 1 tablet (20 mg total) by mouth daily.   UNABLE TO FIND Magnalife Gel Apply to joints 2-3 times daily as needed.   [DISCONTINUED] ciprofloxacin (CIPRO) 500 MG tablet Take 1 tablet (500 mg total) by mouth 2 (two) times daily.   [DISCONTINUED] Glucosamine 500 MG CAPS Take by mouth daily.   [DISCONTINUED] saccharomyces boulardii (FLORASTOR) 250 MG capsule Take 1 capsule (250 mg total) by mouth 2 (two) times daily.   No facility-administered encounter medications on file as of 03/19/2021.    Review of Systems:  Review of Systems  Constitutional: Negative.   HENT: Negative.    Respiratory:  Negative.    Cardiovascular:  Positive for leg swelling.  Gastrointestinal: Negative.   Genitourinary: Negative.   Musculoskeletal:  Positive for arthralgias and myalgias.  Skin: Negative.   Neurological: Negative.   Psychiatric/Behavioral: Negative.    All other systems reviewed and are negative.  Health Maintenance  Topic Date Due   COLONOSCOPY (Pts 45-31yr Insurance coverage will need to be confirmed)  03/14/2021   INFLUENZA VACCINE  03/24/2021   COVID-19 Vaccine (5 - Booster) 03/30/2021   TETANUS/TDAP  08/27/2024   DEXA SCAN  Completed   Hepatitis C Screening  Completed   PNA vac Low Risk Adult  Completed   Zoster Vaccines- Shingrix  Completed   HPV VACCINES  Aged Out   MAMMOGRAM  Discontinued    Physical Exam: Vitals:   03/19/21 0854  BP: (!) 136/98  Pulse: 80  Temp: (!) 97.5 F (36.4 C)  SpO2: 98%  Weight: 180 lb 6.4 oz (81.8 kg)  Height: '5\' 6"'$  (1.676 m)   Body mass index is 29.12 kg/m. Physical Exam Constitutional: Oriented to person, place, and time. Well-developed and well-nourished.  HENT:  Head: Normocephalic.  Mouth/Throat: Oropharynx is clear and moist.  Eyes: Pupils are equal, round, and reactive to light.  Neck: Neck supple.  Cardiovascular: Normal rate and normal heart sounds.  No murmur heard. Pulmonary/Chest: Effort normal and breath sounds normal. No respiratory distress. No wheezes. She has no rales.  Abdominal: Soft. Bowel sounds are normal. No distension. There is no tenderness. There is no rebound.  Musculoskeletal: No edema.  Lymphadenopathy: none Neurological: Alert and oriented to person, place, and time.  Skin: Skin is warm and dry.  Psychiatric: Normal mood and affect. Behavior is normal. Thought content normal.    Labs reviewed: Basic Metabolic Panel: Recent Labs    08/29/20 0000 11/27/20 1316  NA 140 141  K 4.2 3.9  CL 103 104  CO2 21 23  GLUCOSE  --  139*  BUN 12 19  CREATININE 0.6 0.86  CALCIUM 9.2 9.1   Liver  Function Tests: Recent Labs    08/29/20 0000 11/27/20 1316  AST  --  30  ALT 43* 32  ALKPHOS  --  59  BILITOT  --  0.5  PROT  --  7.4  ALBUMIN 4.3 4.1   No results for input(s): LIPASE, AMYLASE in the last 8760 hours. No results for input(s): AMMONIA in the last 8760 hours. CBC: Recent Labs    08/29/20 0000 11/27/20 1316  WBC 5.9 7.9  NEUTROABS  --  4.9  HGB 13.7 12.8  HCT 39 38.1  MCV  --  94.3  PLT 219 211   Lipid Panel: No results for input(s): CHOL, HDL, LDLCALC, TRIG, CHOLHDL, LDLDIRECT in the last 8760 hours. Lab Results  Component Value Date  HGBA1C 6.8 03/14/2020    Procedures since last visit: No results found.  Assessment/Plan Prediabetes Will Recheck A1C On Diet and Exercise Essential hypertension BP stable on Atacand Hyperlipidemia LDL goal <100 On statin Recheck Lipid before next visit B12 deficiency On Supplement Will recheck the levels Bilateral malignant neoplasm of breast in female,  ON Tamoxifen. Does not need Mammograms Will d/w Dr Burr Medico her Side effects History of colon polyps Was told by Dr Tarri Glenn for no Colonoscopy needed till 2024 Primary osteoarthritis involving multiple joints Tylenol PRN Primary insomnia Can use low dose of Melatonin   Labs/tests ordered: CBC,CMP,B12 Lipid panel

## 2021-04-17 ENCOUNTER — Encounter: Payer: Self-pay | Admitting: Internal Medicine

## 2021-05-22 DIAGNOSIS — Z23 Encounter for immunization: Secondary | ICD-10-CM | POA: Diagnosis not present

## 2021-05-28 ENCOUNTER — Encounter: Payer: Self-pay | Admitting: Hematology

## 2021-05-29 ENCOUNTER — Inpatient Hospital Stay: Payer: BLUE CROSS/BLUE SHIELD

## 2021-05-29 ENCOUNTER — Other Ambulatory Visit: Payer: Self-pay

## 2021-05-29 ENCOUNTER — Inpatient Hospital Stay: Payer: BLUE CROSS/BLUE SHIELD | Attending: Hematology | Admitting: Hematology

## 2021-05-29 ENCOUNTER — Encounter: Payer: Self-pay | Admitting: Hematology

## 2021-05-29 VITALS — BP 146/78 | HR 95 | Temp 98.0°F | Resp 18 | Ht 66.0 in | Wt 182.6 lb

## 2021-05-29 DIAGNOSIS — Z79899 Other long term (current) drug therapy: Secondary | ICD-10-CM | POA: Insufficient documentation

## 2021-05-29 DIAGNOSIS — Z170 Estrogen receptor positive status [ER+]: Secondary | ICD-10-CM | POA: Insufficient documentation

## 2021-05-29 DIAGNOSIS — Z9013 Acquired absence of bilateral breasts and nipples: Secondary | ICD-10-CM | POA: Insufficient documentation

## 2021-05-29 DIAGNOSIS — C50812 Malignant neoplasm of overlapping sites of left female breast: Secondary | ICD-10-CM

## 2021-05-29 DIAGNOSIS — R252 Cramp and spasm: Secondary | ICD-10-CM | POA: Diagnosis not present

## 2021-05-29 DIAGNOSIS — E7800 Pure hypercholesterolemia, unspecified: Secondary | ICD-10-CM | POA: Diagnosis not present

## 2021-05-29 DIAGNOSIS — I10 Essential (primary) hypertension: Secondary | ICD-10-CM | POA: Diagnosis not present

## 2021-05-29 DIAGNOSIS — R232 Flushing: Secondary | ICD-10-CM | POA: Diagnosis not present

## 2021-05-29 DIAGNOSIS — M129 Arthropathy, unspecified: Secondary | ICD-10-CM | POA: Diagnosis not present

## 2021-05-29 DIAGNOSIS — Z79810 Long term (current) use of selective estrogen receptor modulators (SERMs): Secondary | ICD-10-CM | POA: Diagnosis not present

## 2021-05-29 LAB — CBC WITH DIFFERENTIAL (CANCER CENTER ONLY)
Abs Immature Granulocytes: 0.03 10*3/uL (ref 0.00–0.07)
Basophils Absolute: 0.1 10*3/uL (ref 0.0–0.1)
Basophils Relative: 1 %
Eosinophils Absolute: 0.1 10*3/uL (ref 0.0–0.5)
Eosinophils Relative: 1 %
HCT: 37 % (ref 36.0–46.0)
Hemoglobin: 12.8 g/dL (ref 12.0–15.0)
Immature Granulocytes: 0 %
Lymphocytes Relative: 30 %
Lymphs Abs: 2.5 10*3/uL (ref 0.7–4.0)
MCH: 32.6 pg (ref 26.0–34.0)
MCHC: 34.6 g/dL (ref 30.0–36.0)
MCV: 94.1 fL (ref 80.0–100.0)
Monocytes Absolute: 0.5 10*3/uL (ref 0.1–1.0)
Monocytes Relative: 6 %
Neutro Abs: 5 10*3/uL (ref 1.7–7.7)
Neutrophils Relative %: 62 %
Platelet Count: 192 10*3/uL (ref 150–400)
RBC: 3.93 MIL/uL (ref 3.87–5.11)
RDW: 12.6 % (ref 11.5–15.5)
WBC Count: 8.2 10*3/uL (ref 4.0–10.5)
nRBC: 0 % (ref 0.0–0.2)

## 2021-05-29 LAB — CMP (CANCER CENTER ONLY)
ALT: 54 U/L — ABNORMAL HIGH (ref 0–44)
AST: 59 U/L — ABNORMAL HIGH (ref 15–41)
Albumin: 4 g/dL (ref 3.5–5.0)
Alkaline Phosphatase: 68 U/L (ref 38–126)
Anion gap: 11 (ref 5–15)
BUN: 20 mg/dL (ref 8–23)
CO2: 23 mmol/L (ref 22–32)
Calcium: 9.4 mg/dL (ref 8.9–10.3)
Chloride: 105 mmol/L (ref 98–111)
Creatinine: 0.9 mg/dL (ref 0.44–1.00)
GFR, Estimated: >60 mL/min (ref >60)
Glucose, Bld: 135 mg/dL — ABNORMAL HIGH (ref 70–99)
Potassium: 4.1 mmol/L (ref 3.5–5.1)
Sodium: 139 mmol/L (ref 135–145)
Total Bilirubin: 0.5 mg/dL (ref 0.3–1.2)
Total Protein: 7.3 g/dL (ref 6.5–8.1)

## 2021-05-29 NOTE — Progress Notes (Signed)
[place]   Telephone:([phone] Fax:(336) [phone]   Clinic Follow up Note   Patient Care Team: Victoria Dad, MD as PCP - General (Internal Medicine)  Date of Service:  05/29/2021  CHIEF COMPLAINT: f/u of left breast cancer  CURRENT THERAPY:  Letrozole once daily for 2 months since the end of 02/2019-04/2019. Did not tolerate well and was switched to Exemestane once daily in 04/2019. Switched to Tamoxifen in 05/2020 due to worsened joint pain.  ASSESSMENT & PLAN:  Victoria Wolfe is a 75 y.o. female with   1. Left multifocal breast Cancer, stage IA (mpT1bN0), ER+/PR+/HER2- -She was diagnosed with stage 1 breast cancer and treated in [place]. She was treated with neoadjuvant anastrozole for 6-8 weeks, b/l mastectomy and reconstruction with implants.  -She has been on adjuvant AI since 02/2019. She has tried AI with anastrozole, Letrozole and exemestane, but did not tolerate due to joint pain. She was switched to Tamoxifen in 05/2020 and tolerating much better with manageable hot flashes and baseline arthritis. Loosing weight is slightly harder for her.  -She is clinically doing well. Lab reviewed, her CBC and CMP are within normal limits. Her physical exam was unremarkable. There is no clinical concern for recurrence. -Continue surveillance. Given b/l mastectomy, she does not need mammograms.  -Continue Tamoxifen. She has not had hysterectomy. She knows to watch for vaginal bleeding.  -F/u in 6 months    2. Comorbidities: HTN, Arthritis with right knee pain, HLD.  -On Crestor, Candesartan/HCTZ. Managed by PCP and Ortho -Arthritis exacerbated on AI and back to baseline on Tamoxifen. She does find it harder for her to lose weight on Tamoxifen. I encouraged her to continue exercise.    3. Bone Health  -Her 09/2019 DEXA was normal (-0.4 T-score).  -On Tamoxifen her bone can strengthen. Continue to monitor her DEXA every 2-3 years.     4. Cancer Screenings  -She is due  to colonoscopy in 02/2021 due to polyps on prior procedure.  -I recommend she continue Pap Smears with her Gyn.      PLAN:  -Continue Tamoxifen, she is tolerating well overall with mild nail change and hair loss  -Lab and f/u in 6 months    No problem-specific Assessment & Plan notes found for this encounter.   SUMMARY OF ONCOLOGIC HISTORY: Oncology History Overview Note  Cancer Staging Cancer of overlapping sites of left breast [facility]) Staging form: Breast, AJCC 8th Edition - Clinical stage from 09/27/2018: Stage IA (cT1b, cN0, cM0, G2, ER+, PR+, HER2-) - Signed by Victoria Merle, MD on 12/07/2019 - Pathologic stage from 02/02/2019: Stage IA (pT1b, pN0, cM0, G2, ER+, PR+, HER2-) - Signed by Victoria Merle, MD on 12/07/2019     Cancer of overlapping sites of left breast ([place])  09/27/2018 Cancer Staging   Staging form: Breast, AJCC 8th Edition - Clinical stage from 09/27/2018: Stage IA (cT1b, cN0, cM0, G2, ER+, PR+, HER2-) - Signed by Victoria Merle, MD on 12/07/2019   10/04/2018 Mammogram   10/04/18 B/l mammogram - multifocal malignancy left breast with one prominent axillary node. Lesions are seperated by at least 3cm, but additional disease is not excluded indeterminate right enhancement-additional spot mammographic imaging and [place] advised to locate for sampling. Birads 6.   10/07/2018 Mammogram   10/07/18 - mammogram suspicious of malignancy. the nodule in the right breast middle depth lateral region seen on the craniocausal view only is at alow suspicion for malignancy. a stereotactic biopsy is reocmnended. Birads 4a  low suspicions for malignancy    10/07/2018 Breast US   10/07/18 B/L [place] - known biopsy proven malignancy. The mass in the lift breast at 11: 00 positoin psterior depth is a known biopsy positive for malignancy. Birads 6 known malignancy    10/18/2018 Initial Diagnosis   Bilateral malignant neoplasm of breast in female [facility])   10/19/2018 Initial Biopsy   09/27/2018 Left breast biopsy  -11:00. 5cm  from nipple: invasive ductal carcinoma, ER96%+, PR 98%+, HER2-, Ki67 25% -Left breast, upper lesion: invasive ductal carcinoma, ER96%+, PR 98%+, HER2-, Ki67 20% 10/19/18 Right breast core biopsy -Bening fibroadipose breast tissue with focal usual ductal hyperplasia.     2020 Genetic Testing   Genetc testing negative    11/2018 - 01/2019 Neo-Adjuvant Anti-estrogen oral therapy   Neoadjuvant Anastrozole once daily for 6-8 weeks and had great response to treatment. Did not tolerate well.    02/02/2019 Surgery   B/l total mastectomy with Sentinel Node Biopsy by Dr. Pasty Arch 12/08/18 PATHOLOGY: 2 left axillary sentinel lymph node were negative for malignant cells. Left breast mastectomy: Invasive and in situ ductal carcinoma, 25m, associated with metal clip and prior biopsy site change, at the 11:00-12:00 position; a second metal clip is present at the 8:00 to 9 o'clock position, no evidence of malignancy Right breast mastectomy: Benign breast tissue   02/02/2019 Cancer Staging   Staging form: Breast, AJCC 8th Edition - Pathologic stage from 02/02/2019: Stage IA (pT1b, pN0, cM0, G2, ER+, PR+, HER2-) - Signed by FTruitt Merle MD on 12/07/2019   02/2019 -  Anti-estrogen oral therapy   Letrozole once daily for 2 months since the end of 02/2019-04/2019. Did not tolerate well and was switched to Exemestane once daily in 04/2019. Switched to Tamoxifen in 05/2020 due to worsened joint pain.    06/30/2019 Surgery   B/l Breast Reconstruction with Dr. CGreig Castilla11/6/20   10/17/2019 Imaging   DEXA ASSESSMENT: The BMD measured at Femur Neck Left is 0.982 g/cm2 with a T-score of -0.4. This patient is considered normal according to [place] criteria.      INTERVAL HISTORY:  JLadaisha Portillois here for a follow up of breast cancer. She was last seen by me on 11/27/20. She presents to the clinic alone. She reports she is doing well overall. She notes her nails are splitting/peeling. She also reports  leg cramps, and she denies taking calcium/vit D. She notes she lives in [facility] independent living. She notes meals are included in the monthly payment, so she notes she is eating very well.   All other systems were reviewed with the patient and are negative.  MEDICAL HISTORY:  Past Medical History:  Diagnosis Date   [facility]    [facility] 10/18/2018   [facility]    Essential hypertension 10/25/2015   High blood pressure    [facility] [place] [facility]    [place]    [place]    Hyperlipidemia LDL goal <130 10/25/2015   Prediabetes 11/19/2017    SURGICAL HISTORY: Past Surgical History:  Procedure Laterality Date   [place]  2018   [place]    [place]2   [place]  2018   [place] Dr.Locker   MASTECTOMY Bilateral 02/02/2019  Per [facility] New Patient Packet, DrMichele Blackwood and Dr.Colon    MENISCECTOMY Right 2009   MENISCUS REPAIR  2004   Per [facility] New Patient Packet, Dr. Milagros Reap   TONSILLECTOMY  [phone]   Per [place] Patient Packet    I have reviewed the social history and family history with the patient and they are unchanged from previous note.  ALLERGIES:  has No Known Allergies.  MEDICATIONS:  Current Outpatient Medications  Medication Sig Dispense Refill   Azelastine-Fluticasone 137-50 MCG/ACT SUSP INSTILL 1 SPRAY INTO BOTH NOSTRILS DAILY 23 g 1   candesartan-hydrochlorothiazide (ATACAND HCT) 32-12.5 MG tablet Take 1 tablet by mouth daily. 90 tablet 3   Cholecalciferol (VITAMIN D3) 50 MCG (2000 UT) capsule Take 2,000 Units by mouth daily.     Cyanocobalamin (VITAMIN B-12) 2500 MCG SUBL      esomeprazole (NEXIUM) 20 MG capsule Take 20 mg by mouth daily at 12 noon.     loperamide (IMODIUM A-D) 2 MG tablet Take 1 tablet (2 mg total) by mouth 4  (four) times daily as needed for diarrhea or loose stools. 30 tablet 0   Polyethyl Glycol-Propyl Glycol (SYSTANE) 0.4-0.3 % SOLN Apply to eye daily.     psyllium (METAMUCIL) 58.6 % packet Take 1 packet by mouth daily.     rosuvastatin (CRESTOR) 20 MG tablet Take 1 tablet (20 mg total) by mouth daily. 90 tablet 3   tamoxifen (NOLVADEX) 20 MG tablet TAKE 1 TABLET BY MOUTH EVERY DAY 90 tablet 1   UNABLE TO FIND Magnalife Gel Apply to joints 2-3 times daily as needed.     No current facility-administered medications for this visit.    PHYSICAL EXAMINATION: ECOG PERFORMANCE STATUS: 0 - Asymptomatic  Vitals:   05/29/21 1425  BP: (!) 146/78  Pulse: 95  Resp: 18  Temp: 98 F (36.7 C)  SpO2: 97%   Wt Readings from Last 3 Encounters:  05/29/21 182 lb 9.6 oz (82.8 kg)  03/19/21 180 lb 6.4 oz (81.8 kg)  11/27/20 179 lb 3.2 oz (81.3 kg)     GENERAL:alert, no distress and comfortable SKIN: skin color, texture, turgor are normal, no rashes or significant lesions EYES: normal, Conjunctiva are pink and non-injected, sclera clear  NECK: supple, thyroid normal size, non-tender, without nodularity LYMPH:  no palpable lymphadenopathy in the cervical, axillary  LUNGS: clear to auscultation and percussion with normal breathing effort HEART: regular rate & rhythm and no murmurs and no lower extremity edema ABDOMEN:abdomen soft, non-tender and normal bowel sounds Musculoskeletal:no cyanosis of digits and no clubbing  NEURO: alert & oriented x 3 with fluent speech, no focal motor/sensory deficits BREAST:  No palpable mass, nodules or adenopathy bilaterally. Breast exam benign.   LABORATORY DATA:  I have reviewed the data as listed CBC Latest Ref Rng & Units 05/29/2021 11/27/2020 08/29/2020  WBC 4.0 - 10.5 K/uL 8.2 7.9 5.9  Hemoglobin 12.0 - 15.0 g/dL 12.8 12.8 13.7  Hematocrit 36.0 - 46.0 % 37.0 38.1 39  Platelets 150 - 400 K/uL 192 211 219     CMP Latest Ref Rng & Units 05/29/2021 11/27/2020  08/29/2020  Glucose 70 - 99 mg/dL 135(H) 139(H) -  BUN 8 - 23 mg/dL 20 19 12   Creatinine 0.44 - 1.00 mg/dL 0.90 0.86 0.6  Sodium 135 - 145 mmol/L 139 141 140  Potassium 3.5 - 5.1 mmol/L 4.1 3.9 4.2  Chloride 98 - 111 mmol/L 105 104 103  CO2 22 - 32 mmol/L 23 23 21  Calcium 8.9 - 10.3 mg/dL 9.4 9.1 9.2  Total Protein 6.5 - 8.1 g/dL 7.3 7.4 -  Total Bilirubin 0.3 - 1.2 mg/dL 0.5 0.5 -  Alkaline Phos 38 - 126 U/L 68 59 -  AST 15 - 41 U/L 59(H) 30 -  ALT 0 - 44 U/L 54(H) 32 43(A)      RADIOGRAPHIC STUDIES: I have personally reviewed the radiological images as listed and agreed with the findings in the report. No results found.    No orders of the defined types were placed in this encounter.  All questions were answered. The patient knows to call the clinic with any problems, questions or concerns. No barriers to learning was detected. The total time spent in the appointment was 30 minutes.     Victoria Merle, MD 05/29/2021   I, Wilburn Mylar, am acting as scribe for Victoria Merle, MD.   I have reviewed the above documentation for accuracy and completeness, and I agree with the above.

## 2021-06-17 ENCOUNTER — Encounter: Payer: Self-pay | Admitting: Internal Medicine

## 2021-06-19 NOTE — Telephone Encounter (Signed)
Would you like for me to submit a Prior Authorization.   If so, What Diagnosis can I use for this patient for Medical Necessity.   Please Advise.

## 2021-06-25 NOTE — Telephone Encounter (Signed)
Received fax from [place] stated that Medication does not require authorization at this time, and Darcy Cordner be covered at the pharmacy in accordance with your benefit plan.   They have also sent letter of statement to patient.

## 2021-06-25 NOTE — Telephone Encounter (Signed)
Victoria Dad, MD  You 23 hours ago (10:17 AM)   Lets use Allergic Rhinitis.      Prior Authorization Submitted through [facility] [employer] [facility]. Went into review and will know outcome in 48-72 hours.  Awaiting Determination.   Key: [id number]

## 2021-07-07 ENCOUNTER — Telehealth: Payer: Self-pay

## 2021-07-07 NOTE — Telephone Encounter (Signed)
Pt call c/o vaginal dryness, frequent urination, leg & toe cramping, lower back pain, and severe hot flashes all related to her Tamoxifen.  Pt indicated she wants to stop taking Tamoxifen.  Pt requested to speak to Dr. Burr Medico regarding this medication.  Notified Dr. Burr Medico of pt's request.

## 2021-08-04 ENCOUNTER — Non-Acute Institutional Stay: Payer: BLUE CROSS/BLUE SHIELD | Admitting: Adult Health

## 2021-08-04 ENCOUNTER — Other Ambulatory Visit: Payer: Self-pay

## 2021-08-04 ENCOUNTER — Encounter: Payer: Self-pay | Admitting: Adult Health

## 2021-08-04 VITALS — BP 148/102 | HR 73 | Temp 96.4°F | Ht 66.0 in | Wt 182.0 lb

## 2021-08-04 DIAGNOSIS — R030 Elevated blood-pressure reading, without diagnosis of hypertension: Secondary | ICD-10-CM | POA: Diagnosis not present

## 2021-08-04 DIAGNOSIS — N3000 Acute cystitis without hematuria: Secondary | ICD-10-CM

## 2021-08-04 MED ORDER — CEPHALEXIN 500 MG PO CAPS: 500.0000 mg | ORAL_CAPSULE | Freq: Three times a day (TID) | ORAL | 0 refills | Status: DC

## 2021-08-04 NOTE — Progress Notes (Signed)
[facility]  POS: Clinic  Provider: Cindi Carbon, [place] [phone]    Code Status:  Goals of Care:  Advanced Directives 03/19/2021  Does Patient Have a Medical Advance Directive? Yes  Type of Advance Directive Living will;Healthcare Power of Attorney  Does patient want to make changes to medical advance directive? No - Patient declined  Copy of [place] in Chart? Yes - validated most recent copy scanned in chart (See row information)  Pre-existing out of facility DNR order (yellow form or pink MOST form) -     Chief Complaint  Patient presents with   Acute Visit    Patient returns to the clinic for low back pain and frequent urination.     HPI: Patient is a 75 y.o. female seen today for an acute visit for low back pain and frequent urination.  She is having these symptoms for 5 days. Also feels some dysuria. No fever or purulent urine.  Has been drinking less water. Has a hx of breat ca and was on tamoxifen but stopped due to s/e.  She has had some vaginal dryness which has mimicked signs of a UTI but she feels this is worse.  Past Medical History:  Diagnosis Date   Arthritis    Per [facility] New Patient Packet    Bilateral malignant neoplasm of breast in female [facility]) 10/18/2018   Breast cancer [facility])    Per [facility] New Patient Packet    Essential hypertension 10/25/2015   High blood pressure    Per [place] New Patient Packet    High cholesterol    Per [place] New Patient Packet    Hyperlipidemia LDL goal <130 10/25/2015   Prediabetes 11/19/2017    Past Surgical History:  Procedure Laterality Date   CATARACT EXTRACTION  2018   Per [place] New Patient Packet, [place]     x2   COLONOSCOPY  2018   Per [place] new patient packet, Dr.Locker   MASTECTOMY Bilateral 02/02/2019   Per [facility] New Patient Packet, DrMichele Blackwood and Dr.Colon    MENISCECTOMY Right 2009   MENISCUS REPAIR  2004   Per [facility] New Patient Packet,  Dr. Milagros Reap   TONSILLECTOMY  [phone]   Per [place] Patient Packet    No Known Allergies  Outpatient Encounter Medications as of 08/04/2021  Medication Sig   Azelastine-Fluticasone 137-50 MCG/ACT SUSP INSTILL 1 SPRAY INTO BOTH NOSTRILS DAILY   candesartan-hydrochlorothiazide (ATACAND HCT) 32-12.5 MG tablet Take 1 tablet by mouth daily.   Cholecalciferol (VITAMIN D3) 50 MCG (2000 UT) capsule Take 2,000 Units by mouth daily.   Cyanocobalamin (VITAMIN B-12) 2500 MCG SUBL    esomeprazole (NEXIUM) 20 MG capsule Take 20 mg by mouth daily at 12 noon.   loperamide (IMODIUM A-D) 2 MG tablet Take 1 tablet (2 mg total) by mouth 4 (four) times daily as needed for diarrhea or loose stools.   Polyethyl Glycol-Propyl Glycol (SYSTANE) 0.4-0.3 % SOLN Apply to eye daily.   psyllium (METAMUCIL) 58.6 % packet Take 1 packet by mouth daily.   rosuvastatin (CRESTOR) 20 MG tablet Take 1 tablet (20 mg total) by mouth daily.   tamoxifen (NOLVADEX) 20 MG tablet TAKE 1 TABLET BY MOUTH EVERY DAY   UNABLE TO FIND Magnalife Gel Apply to joints 2-3 times daily as needed.   No facility-administered encounter medications on file as of 08/04/2021.    Review of Systems:  Review of Systems  Constitutional:  Negative for activity change, appetite change, chills, diaphoresis, fatigue, fever and unexpected weight change.  HENT:  Negative for congestion.   Respiratory:  Negative for cough, shortness of breath and wheezing.   Cardiovascular:  Negative for chest pain, palpitations and leg swelling.  Gastrointestinal:  Negative for abdominal distention, abdominal pain, constipation and diarrhea.  Genitourinary:  Positive for decreased urine volume, dysuria, flank pain, frequency and urgency. Negative for difficulty urinating, hematuria, pelvic pain, vaginal bleeding, vaginal discharge and vaginal pain.  Musculoskeletal:  Negative for arthralgias, back pain, gait problem, joint swelling and myalgias.  Neurological:  Negative for  dizziness, tremors, seizures, syncope, facial asymmetry, speech difficulty, weakness, light-headedness, numbness and headaches.  Psychiatric/Behavioral:  Negative for agitation, behavioral problems and confusion.    Health Maintenance  Topic Date Due   COLONOSCOPY (Pts 45-47yrs Insurance coverage will need to be confirmed)  03/14/2021   INFLUENZA VACCINE  03/24/2021   TETANUS/TDAP  08/27/2024   Pneumonia Vaccine 65+ Years old  Completed   DEXA SCAN  Completed   COVID-19 Vaccine  Completed   Hepatitis C Screening  Completed   Zoster Vaccines- Shingrix  Completed   HPV VACCINES  Aged Out    Physical Exam: There were no vitals filed for this visit. There is no height or weight on file to calculate BMI. Physical Exam Vitals and nursing note reviewed.  Constitutional:      General: She is not in acute distress.    Appearance: She is not diaphoretic.  HENT:     Head: Normocephalic and atraumatic.  Neck:     Vascular: No JVD.  Cardiovascular:     Rate and Rhythm: Normal rate and regular rhythm.     Heart sounds: No murmur heard. Pulmonary:     Effort: Pulmonary effort is normal. No respiratory distress.     Breath sounds: Normal breath sounds. No wheezing.  Abdominal:     General: Abdomen is flat. Bowel sounds are normal. There is no distension.     Palpations: Abdomen is soft.     Tenderness: There is no abdominal tenderness. There is no right CVA tenderness or left CVA tenderness.  Skin:    General: Skin is warm and dry.  Neurological:     Mental Status: She is alert and oriented to person, place, and time.    Labs reviewed: Basic Metabolic Panel: Recent Labs    08/29/20 0000 11/27/20 1316 05/29/21 1420  NA 140 141 139  K 4.2 3.9 4.1  CL 103 104 105  CO2 21 23 23   GLUCOSE  --  139* 135*  BUN 12 19 20   CREATININE 0.6 0.86 0.90  CALCIUM 9.2 9.1 9.4   Liver Function Tests: Recent Labs    08/29/20 0000 11/27/20 1316 05/29/21 1420  AST  --  30 59*  ALT 43* 32  54*  ALKPHOS  --  59 68  BILITOT  --  0.5 0.5  PROT  --  7.4 7.3  ALBUMIN 4.3 4.1 4.0   No results for input(s): LIPASE, AMYLASE in the last 8760 hours. No results for input(s): AMMONIA in the last 8760 hours. CBC: Recent Labs    08/29/20 0000 11/27/20 1316 05/29/21 1420  WBC 5.9 7.9 8.2  NEUTROABS  --  4.9 5.0  HGB 13.7 12.8 12.8  HCT 39 38.1 37.0  MCV  --  94.3 94.1  PLT 219 211 192   Lipid Panel: No results for input(s): CHOL, HDL, LDLCALC, TRIG, CHOLHDL, LDLDIRECT in the last 8760  hours. Lab Results  Component Value Date   HGBA1C 6.8 03/14/2020    Procedures since last visit: No results found.  Assessment/Plan  1. Acute cystitis without hematuria Symptoms of UTI present will begin Keflex UA C and S sent, follow culture Encourage oral fluids  - cephALEXin (KEFLEX) 500 MG capsule; Take 1 capsule (500 mg total) by mouth 3 (three) times daily.  Dispense: 21 capsule; Refill: 0  2. Elevated blood pressure Likely due to discomfort Pt will monitor at home and report to [place] if bp not trending down  Total time 34min:  time greater than 50% of total time spent doing pt counseling and coordination of care     Labs/tests ordered:  * No order type specified * UA C and S Next appt:  09/15/2021

## 2021-08-08 ENCOUNTER — Telehealth: Payer: Self-pay | Admitting: Adult Health

## 2021-08-08 NOTE — Telephone Encounter (Signed)
Called and spoke with Victoria Wolfe and let her know that her UA showed a few WBCs and rare bacteria. Culture pending. [name] says she is feeling better after taking Keflex. F/U if further symptoms

## 2021-08-11 ENCOUNTER — Other Ambulatory Visit: Payer: Self-pay | Admitting: Internal Medicine

## 2021-09-02 DIAGNOSIS — I10 Essential (primary) hypertension: Secondary | ICD-10-CM | POA: Diagnosis not present

## 2021-09-02 DIAGNOSIS — E538 Deficiency of other specified B group vitamins: Secondary | ICD-10-CM | POA: Diagnosis not present

## 2021-09-02 DIAGNOSIS — R7303 Prediabetes: Secondary | ICD-10-CM | POA: Diagnosis not present

## 2021-09-02 DIAGNOSIS — E785 Hyperlipidemia, unspecified: Secondary | ICD-10-CM | POA: Diagnosis not present

## 2021-09-02 LAB — HEPATIC FUNCTION PANEL
ALT: 91 — AB (ref 7–35)
AST: 108 — AB (ref 13–35)
Alkaline Phosphatase: 82 (ref 25–125)
Bilirubin, Total: 0.4

## 2021-09-02 LAB — COMPREHENSIVE METABOLIC PANEL
Albumin: 4.6 (ref 3.5–5.0)
Calcium: 9.7 (ref 8.7–10.7)
Globulin: 2.6

## 2021-09-02 LAB — BASIC METABOLIC PANEL
BUN: 12 (ref 4–21)
CO2: 21 (ref 13–22)
Chloride: 102 (ref 99–108)
Creatinine: 0.6 (ref 0.5–1.1)
Glucose: 148
Potassium: 3.9 (ref 3.4–5.3)
Sodium: 146 (ref 137–147)

## 2021-09-02 LAB — LIPID PANEL
Cholesterol: 172 (ref 0–200)
HDL: 52 (ref 35–70)
LDL Cholesterol: 96
LDl/HDL Ratio: 3.3
Triglycerides: 120 (ref 40–160)

## 2021-09-02 LAB — CBC AND DIFFERENTIAL
HCT: 40 (ref 36–46)
Hemoglobin: 13.6 (ref 12.0–16.0)
Platelets: 215 (ref 150–399)
WBC: 5.9

## 2021-09-02 LAB — VITAMIN B12: Vitamin B-12: 1108

## 2021-09-02 LAB — CBC: RBC: 4.11 (ref 3.87–5.11)

## 2021-09-02 LAB — HEMOGLOBIN A1C: Hemoglobin A1C: 6.3

## 2021-09-15 ENCOUNTER — Other Ambulatory Visit: Payer: Self-pay

## 2021-09-15 ENCOUNTER — Non-Acute Institutional Stay: Payer: Medicare Other | Admitting: Adult Health

## 2021-09-15 ENCOUNTER — Encounter: Payer: Self-pay | Admitting: Adult Health

## 2021-09-15 VITALS — BP 152/98 | HR 92 | Temp 97.5°F | Ht 66.0 in | Wt 182.4 lb

## 2021-09-15 DIAGNOSIS — E119 Type 2 diabetes mellitus without complications: Secondary | ICD-10-CM

## 2021-09-15 DIAGNOSIS — I10 Essential (primary) hypertension: Secondary | ICD-10-CM | POA: Diagnosis not present

## 2021-09-15 DIAGNOSIS — R7989 Other specified abnormal findings of blood chemistry: Secondary | ICD-10-CM

## 2021-09-15 DIAGNOSIS — E559 Vitamin D deficiency, unspecified: Secondary | ICD-10-CM | POA: Diagnosis not present

## 2021-09-15 DIAGNOSIS — E785 Hyperlipidemia, unspecified: Secondary | ICD-10-CM | POA: Diagnosis not present

## 2021-09-15 DIAGNOSIS — R350 Frequency of micturition: Secondary | ICD-10-CM | POA: Diagnosis not present

## 2021-09-15 DIAGNOSIS — C50812 Malignant neoplasm of overlapping sites of left female breast: Secondary | ICD-10-CM | POA: Diagnosis not present

## 2021-09-15 HISTORY — DX: Type 2 diabetes mellitus without complications: E11.9

## 2021-09-15 HISTORY — DX: Vitamin D deficiency, unspecified: E55.9

## 2021-09-15 HISTORY — DX: Other specified abnormal findings of blood chemistry: R79.89

## 2021-09-15 NOTE — Progress Notes (Signed)
Location: [facility]  POS: clinic  Provider:  Cindi Carbon, [place] [phone]   Code Status:  Goals of Care:  Advanced Directives 03/19/2021  Does Patient Have a Medical Advance Directive? Yes  Type of Advance Directive Living will;Healthcare Power of Attorney  Does patient want to make changes to medical advance directive? No - Patient declined  Copy of [place] in Chart? Yes - validated most recent copy scanned in chart (See row information)  Pre-existing out of facility DNR order (yellow form or pink MOST form) -     Chief Complaint  Patient presents with   Medical Management of Chronic Issues    Patient returns to the clinic for her 6 month follow up. CC: Frequent urination.    Quality Metric Gaps    Colonoscopy    HPI: Patient is a 76 y.o. female seen today for medical management of chronic diseases.      140/88 at home.  Thinks her bp is up here due to "white coat" syndrome.   Has vaginal dryness uses recleanse. Has occasional discharge. No bladder pain. No fever. Continues with frequency, up q 2 hrs at night.  Off tamoxifen due to leg cramps and hot flashes.   She is trying to lose weight. She stopped drinking wine to help with weight loss but this has not helped her frequency.   Fasting glucose 148 A1C 6.3  ALT AST 91 108 MCV 97.7 Drinking 1 bottle of wine per night and stopped last Friday  Working out most days for 30 min LDL 96 on crestor.  Past Medical History:  Diagnosis Date   Arthritis    Per [facility] New Patient Packet    Bilateral malignant neoplasm of breast in female [facility]) 10/18/2018   Breast cancer [facility])    Per [facility] New Patient Packet    Essential hypertension 10/25/2015   High blood pressure    Per [place] New Patient Packet    High cholesterol    Per [place] New Patient Packet    Hyperlipidemia LDL goal <130 10/25/2015   Prediabetes 11/19/2017    Past Surgical History:  Procedure Laterality Date    CATARACT EXTRACTION  2018   Per [place] New Patient Packet, [place]     x2   COLONOSCOPY  2018   Per [place] new patient packet, Dr.Locker   MASTECTOMY Bilateral 02/02/2019   Per [facility] New Patient Packet, DrMichele Blackwood and Dr.Colon    MENISCECTOMY Right 2009   MENISCUS REPAIR  2004   Per [facility] New Patient Packet, Dr. Milagros Reap   TONSILLECTOMY  [phone]   Per [place] Patient Packet    No Known Allergies  Outpatient Encounter Medications as of 09/15/2021  Medication Sig   Azelastine-Fluticasone 137-50 MCG/ACT SUSP INSTILL 1 SPRAY INTO BOTH NOSTRILS DAILY   BIOTIN 5000 PO Take 1 capsule by mouth daily.   candesartan-hydrochlorothiazide (ATACAND HCT) 32-12.5 MG tablet Take 1 tablet by mouth daily.   Cholecalciferol (VITAMIN D3) 50 MCG (2000 UT) capsule Take 1,000 Units by mouth daily.   Cyanocobalamin (VITAMIN B-12) 2500 MCG SUBL    esomeprazole (NEXIUM) 20 MG capsule Take 20 mg by mouth daily at 12 noon.   loperamide (IMODIUM A-D) 2 MG tablet Take 1 tablet (2 mg total) by mouth 4 (four) times daily as needed for diarrhea or loose stools.   Polyethyl Glycol-Propyl Glycol (SYSTANE) 0.4-0.3 % SOLN Apply to eye daily.   psyllium (METAMUCIL) 58.6 %  packet Take 1 packet by mouth daily.   rosuvastatin (CRESTOR) 20 MG tablet Take 1 tablet (20 mg total) by mouth daily.   vitamin C (ASCORBIC ACID) 500 MG tablet Take 500 mg by mouth daily.   [DISCONTINUED] cephALEXin (KEFLEX) 500 MG capsule Take 1 capsule (500 mg total) by mouth 3 (three) times daily.   [DISCONTINUED] tamoxifen (NOLVADEX) 20 MG tablet TAKE 1 TABLET BY MOUTH EVERY DAY   [DISCONTINUED] UNABLE TO FIND Magnalife Gel Apply to joints 2-3 times daily as needed.   No facility-administered encounter medications on file as of 09/15/2021.    Review of Systems:  Review of Systems  Constitutional:  Negative for activity change, appetite change, chills, diaphoresis, fatigue, fever and unexpected weight change.  HENT:   Negative for congestion.   Respiratory:  Negative for cough, shortness of breath and wheezing.   Cardiovascular:  Negative for chest pain, palpitations and leg swelling.  Gastrointestinal:  Negative for abdominal distention, abdominal pain, constipation and diarrhea.  Genitourinary:  Negative for difficulty urinating and dysuria.  Musculoskeletal:  Negative for arthralgias, back pain, gait problem, joint swelling and myalgias.  Neurological:  Negative for dizziness, tremors, seizures, syncope, facial asymmetry, speech difficulty, weakness, light-headedness, numbness and headaches.  Psychiatric/Behavioral:  Negative for agitation, behavioral problems and confusion.    Health Maintenance  Topic Date Due   COLONOSCOPY (Pts 45-7yrs Insurance coverage will need to be confirmed)  03/14/2021   TETANUS/TDAP  08/27/2024   Pneumonia Vaccine 46+ Years old  Completed   INFLUENZA VACCINE  Completed   DEXA SCAN  Completed   COVID-19 Vaccine  Completed   Hepatitis C Screening  Completed   Zoster Vaccines- Shingrix  Completed   HPV VACCINES  Aged Out    Physical Exam: Vitals:   09/15/21 1310  BP: (!) 152/98  Pulse: 92  Temp: (!) 97.5 F (36.4 C)  SpO2: 97%  Weight: 182 lb 6.4 oz (82.7 kg)  Height: 5\' 6"  (1.676 m)   Body mass index is 29.44 kg/m. Physical Exam Vitals and nursing note reviewed.  Constitutional:      General: She is not in acute distress.    Appearance: She is not diaphoretic.  HENT:     Head: Normocephalic and atraumatic.     Right Ear: Tympanic membrane normal.     Left Ear: Tympanic membrane normal.     Nose: Nose normal.     Mouth/Throat:     Mouth: Mucous membranes are moist.     Pharynx: Oropharynx is clear.  Neck:     Vascular: No JVD.  Cardiovascular:     Rate and Rhythm: Normal rate and regular rhythm.     Heart sounds: No murmur heard. Pulmonary:     Effort: Pulmonary effort is normal. No respiratory distress.     Breath sounds: Normal breath sounds. No  wheezing.  Abdominal:     General: Abdomen is flat. Bowel sounds are normal. There is no distension.     Palpations: Abdomen is soft.     Tenderness: There is no right CVA tenderness or left CVA tenderness.  Musculoskeletal:     Cervical back: No rigidity or tenderness.  Lymphadenopathy:     Cervical: No cervical adenopathy.  Skin:    General: Skin is warm and dry.  Neurological:     Mental Status: She is alert and oriented to person, place, and time.  Psychiatric:        Mood and Affect: Mood normal.    Labs reviewed:  Basic Metabolic Panel: Recent Labs    11/27/20 1316 05/29/21 1420 09/02/21 0000  NA 141 139 146  K 3.9 4.1 3.9  CL 104 105 102  CO2 23 23 21   GLUCOSE 139* 135*  --   BUN 19 20 12   CREATININE 0.86 0.90 0.6  CALCIUM 9.1 9.4 9.7   Liver Function Tests: Recent Labs    11/27/20 1316 05/29/21 1420 09/02/21 0000  AST 30 59* 108*  ALT 32 54* 91*  ALKPHOS 59 68 82  BILITOT 0.5 0.5  --   PROT 7.4 7.3  --   ALBUMIN 4.1 4.0 4.6   No results for input(s): LIPASE, AMYLASE in the last 8760 hours. No results for input(s): AMMONIA in the last 8760 hours. CBC: Recent Labs    11/27/20 1316 05/29/21 1420 09/02/21 0000  WBC 7.9 8.2 5.9  NEUTROABS 4.9 5.0  --   HGB 12.8 12.8 13.6  HCT 38.1 37.0 40  MCV 94.3 94.1  --   PLT 211 192 215   Lipid Panel: Recent Labs    09/02/21 0000  CHOL 172  HDL 52  LDLCALC 96  TRIG 120   Lab Results  Component Value Date   HGBA1C 6.3 09/02/2021    Procedures since last visit: No results found.  Assessment/Plan  Essential hypertension BP readings elevated in the office but lower at home. She is going to try lifestyle mods but cutting out alcohol, diet changes, etc. If still elevated at next visit would consider treatment.   Hyperlipidemia LDL goal <100 LDL at goal  Continue Crestor.   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  If not improving consider metformin  Vitamin D deficiency Level was 30 in 2017.  Now on Vit d No current symptoms Would continue due to deficiency and bone health.   Elevated LFTs With macrocytosis possibly due to alcohol use.  She has already stopped drinking. Will repeat level at next visit. If worsening or not improving consider additional testing. No symptoms at this time. On crestor but has been for years.    Frequency No fever or bladder pain Will check UA C and S Last UA C and S only showed rare bacteria neg nitrite, only 4 WBC Consider treatment for post menopausal symptoms if applicable but would have to consider hx of breast ca. She is seeing GYN next month.  Also could consider reducing diuretic if UA negative.   Tried to get colonoscopy which was recommended q 2 years when she was in [place] but the GI doc here said to wait until the following.  Labs/tests ordered:  * No order type specified * CBC CMP A1C Next appt: 3 months with Dr Lyndel Safe

## 2021-09-15 NOTE — Assessment & Plan Note (Signed)
Discussed two readings >126 is diagnostic for Type 2 Will work on lifestyle modification and come back for repeat labs.  If not improving consider metformin

## 2021-09-15 NOTE — Assessment & Plan Note (Signed)
BP readings elevated in the office but lower at home. She is going to try lifestyle mods but cutting out alcohol, diet changes, etc. If still elevated at next visit would consider treatment.

## 2021-09-15 NOTE — Assessment & Plan Note (Signed)
LDL at goal.  Continue Crestor. 

## 2021-09-15 NOTE — Assessment & Plan Note (Signed)
S/p mastectomy Off tamoxifen due to muscle cramps and hot flashes per oncology

## 2021-09-15 NOTE — Assessment & Plan Note (Signed)
With macrocytosis possibly due to alcohol use.  She has already stopped drinking. Will repeat level at next visit. If worsening or not improving consider additional testing. No symptoms at this time. On crestor but has been for years.

## 2021-09-15 NOTE — Assessment & Plan Note (Signed)
Level was 30 in 2017.  Now on Vit d No current symptoms Would continue due to deficiency and bone health.

## 2021-09-16 ENCOUNTER — Encounter: Payer: Self-pay | Admitting: Adult Health

## 2021-09-16 DIAGNOSIS — R350 Frequency of micturition: Secondary | ICD-10-CM | POA: Diagnosis not present

## 2021-09-18 ENCOUNTER — Telehealth: Payer: Self-pay | Admitting: Adult Health

## 2021-09-18 NOTE — Telephone Encounter (Signed)
Patient notified and agreed.  

## 2021-09-18 NOTE — Telephone Encounter (Signed)
Final urine culture [facility] at [facility] showing no significant growth of bacteria. Could we please call Victoria Wolfe and let her know. If she is still having frequency this could be due to post menopausal symptoms or overactive bladder. She has an apt with GYN coming up and would recommend discussing with them.

## 2021-09-19 ENCOUNTER — Ambulatory Visit (INDEPENDENT_AMBULATORY_CARE_PROVIDER_SITE_OTHER): Payer: Medicare Other | Admitting: Obstetrics & Gynecology

## 2021-09-19 ENCOUNTER — Encounter: Payer: Self-pay | Admitting: Obstetrics & Gynecology

## 2021-09-19 ENCOUNTER — Other Ambulatory Visit: Payer: Self-pay

## 2021-09-19 VITALS — BP 120/70 | Temp 98.3°F

## 2021-09-19 DIAGNOSIS — R350 Frequency of micturition: Secondary | ICD-10-CM

## 2021-09-19 DIAGNOSIS — B3731 Acute candidiasis of vulva and vagina: Secondary | ICD-10-CM | POA: Diagnosis not present

## 2021-09-19 MED ORDER — FLUCONAZOLE 150 MG PO TABS: 150.0000 mg | ORAL_TABLET | Freq: Every day | ORAL | 2 refills | Status: AC

## 2021-09-19 NOTE — Progress Notes (Signed)
° ° °  Victoria Wolfe [birth year] [id number]        76 y.o.  G2P2002   RP: Urinary frequency and vaginal dryness x a few days  HPI:  Urinary frequency and vaginal dryness x a few days.  Mild lower back pain. No pelvic pain.  No PMB.  No fever.   OB History  Gravida Para Term Preterm AB Living  2 2 2    0 2  SAB IAB Ectopic Multiple Live Births  0   0        # Outcome Date GA Lbr Len/2nd Weight Sex Delivery Anes PTL Lv  2 Term           1 Term             Past medical history,surgical history, problem list, medications, allergies, family history and social history were all reviewed and documented in the EPIC chart.   Directed ROS with pertinent positives and negatives documented in the history of present illness/assessment and plan.  Exam:  Vitals:   09/19/21 1028  BP: 120/70  Temp: 98.3 F (36.8 C)  TempSrc: Oral   General appearance:  Normal  CVAT Neg bilaterally  Abdomen: Normal  Gynecologic exam: Vulva with thick yeasty discharge.  U/A: Yellow clear, Pro Neg, Nit Neg, WBC 0-5, RBC NS, Bacteria Few.  Yeasts present.  U. Culture pending.   Assessment/Plan:  76 y.o. G2P2002   1. Urinary frequency Urinary frequency and vaginal dryness x a few days.  Mild lower back pain. No pelvic pain.  No PMB.  No fever.  Thick yeasty discharge at the vulva.  U/A showing yeasts with budding.  Cystitis from Candida.  Will treat with Fluconazole 150 mg 1 tab PO daily x 3.  Usage reviewed and prescription sent to pharmacy.  May use Boric Acid for prevention.  Pending U. Culture. - Urinalysis,Complete w/RFL Culture  2. Yeast vaginitis Yeast vaginitis clinically and on exam.  Will treat with Fluconazole.  Other orders - Vaginal Lubricant (REPLENS VA); Place vaginally. - fluconazole (DIFLUCAN) 150 MG tablet; Take 1 tablet (150 mg total) by mouth daily for 3 days.   Princess Bruins MD, 10:38 AM 09/19/2021

## 2021-09-20 ENCOUNTER — Other Ambulatory Visit: Payer: Self-pay | Admitting: Internal Medicine

## 2021-09-21 LAB — URINALYSIS, COMPLETE W/RFL CULTURE
Bilirubin Urine: NEGATIVE
Glucose, UA: NEGATIVE
Hgb urine dipstick: NEGATIVE
Hyaline Cast: NONE SEEN /LPF
Ketones, ur: NEGATIVE
Leukocyte Esterase: NEGATIVE
Nitrites, Initial: NEGATIVE
Protein, ur: NEGATIVE
RBC / HPF: NONE SEEN /HPF (ref 0–2)
Specific Gravity, Urine: 1.01 (ref 1.001–1.035)
pH: 5.5 (ref 5.0–8.0)

## 2021-09-21 LAB — URINE CULTURE
MICRO NUMBER:: 12929784
SPECIMEN QUALITY:: ADEQUATE

## 2021-09-21 LAB — CULTURE INDICATED

## 2021-09-22 ENCOUNTER — Other Ambulatory Visit: Payer: Self-pay

## 2021-09-22 ENCOUNTER — Other Ambulatory Visit: Payer: Self-pay | Admitting: Internal Medicine

## 2021-09-22 DIAGNOSIS — K7200 Acute and subacute hepatic failure without coma: Secondary | ICD-10-CM

## 2021-09-22 DIAGNOSIS — C50812 Malignant neoplasm of overlapping sites of left female breast: Secondary | ICD-10-CM

## 2021-09-22 NOTE — Progress Notes (Signed)
Per staff message from Dr. Burr Medico, order given for US abdomen w/in the next few weeks and f/u with Dr. Burr Medico in 4 to 8 wks with repeat labs (CBC w/Diff & CMP).  Victoria Wolfe will get pt scheduled for [place] of abdomen.

## 2021-09-23 ENCOUNTER — Other Ambulatory Visit: Payer: Self-pay

## 2021-10-10 ENCOUNTER — Other Ambulatory Visit: Payer: Self-pay

## 2021-10-10 ENCOUNTER — Encounter: Payer: Self-pay | Admitting: Family

## 2021-10-10 ENCOUNTER — Ambulatory Visit (INDEPENDENT_AMBULATORY_CARE_PROVIDER_SITE_OTHER): Payer: Medicare Other | Admitting: Family

## 2021-10-10 VITALS — BP 136/82 | HR 81 | Temp 97.7°F | Resp 18 | Ht 66.0 in

## 2021-10-10 DIAGNOSIS — R051 Acute cough: Secondary | ICD-10-CM | POA: Diagnosis not present

## 2021-10-10 DIAGNOSIS — J3489 Other specified disorders of nose and nasal sinuses: Secondary | ICD-10-CM | POA: Diagnosis not present

## 2021-10-10 DIAGNOSIS — R062 Wheezing: Secondary | ICD-10-CM

## 2021-10-10 MED ORDER — DOXYCYCLINE HYCLATE 100 MG PO TABS: 100.0000 mg | ORAL_TABLET | Freq: Two times a day (BID) | ORAL | 0 refills | Status: AC

## 2021-10-10 MED ORDER — ZINC GLUCONATE 50 MG PO TABS: 50.0000 mg | ORAL_TABLET | Freq: Every day | ORAL | 0 refills | Status: AC

## 2021-10-10 NOTE — Progress Notes (Signed)
Provider: Emannuel Vise FNP-C  [name], MD  Patient Care Team: [name], MD as PCP - General (Internal Medicine)  Extended Emergency Contact Information Primary Emergency Contact: [place] Mobile Phone: [phone] Relation: Sister  Code Status:  Full code  Goals of care: Advanced Directive information Advanced Directives 10/10/2021  Does Patient Have a Medical Advance Directive? Yes  Type of Paramedic of [place];Living will  Does patient want to make changes to medical advance directive? No - Patient declined  Copy of [place] in Chart? Yes - validated most recent copy scanned in chart (See row information)  Pre-existing out of facility DNR order (yellow form or pink MOST form) -     Chief Complaint  Patient presents with   Acute Visit    Patient complains of bad cough, rattling/wheezing in lung, and sinus pressure. Symptoms started last night.     HPI:  Pt is a 76 y.o. female seen today for an acute visit for evaluation of bad cough,ratting /wheezing and sinus pressure x 1 day.sometimes coughs up but has not spit out.feels like there's a weight on the chest. She denies any fever,chills,,fatigue,body aches,,chest tightness,chest pain,palpitation or shortness of breath. Does not know if she has been around with sick person with COVID-19  Appetite is good.  No hx of asthma.    Past Medical History:  Diagnosis Date   Arthritis    Per [facility] New Patient Packet    Bilateral malignant neoplasm of breast in female [facility]) 10/18/2018   Breast cancer [facility])    Per [facility] New Patient Packet    Essential hypertension 10/25/2015   High blood pressure    Per [place] New Patient Packet    High cholesterol    Per [place] New Patient Packet    Hyperlipidemia LDL goal <130 10/25/2015   Prediabetes 11/19/2017   Past Surgical History:  Procedure Laterality Date   CATARACT EXTRACTION  2018   Per [place] New Patient Packet, [place]     x2   COLONOSCOPY  2018   Per [place] new patient packet, Dr.Locker   MASTECTOMY Bilateral 02/02/2019   Per [facility] New Patient Packet, DrMichele Blackwood and Dr.Colon    MENISCECTOMY Right 2009   MENISCUS REPAIR  2004   Per [facility] New Patient Packet, Dr. Milagros Reap   TONSILLECTOMY  [phone]   Per [place] Patient Packet    No Known Allergies  Outpatient Encounter Medications as of 10/10/2021  Medication Sig   Azelastine-Fluticasone 137-50 MCG/ACT SUSP SPRAY 1 SPRAY INTO EACH NOSTRIL DAILY   BIOTIN 5000 PO Take 1 capsule by mouth daily.   candesartan-hydrochlorothiazide (ATACAND HCT) 32-12.5 MG tablet Take 1 tablet by mouth daily.   Cholecalciferol (VITAMIN D3) 50 MCG (2000 UT) capsule Take 2,000 Units by mouth daily.   Cyanocobalamin (VITAMIN B-12) 2500 MCG SUBL    esomeprazole (NEXIUM) 20 MG capsule Take 20 mg by mouth daily at 12 noon.   loperamide (IMODIUM A-D) 2 MG tablet Take 1 tablet (2 mg total) by mouth 4 (four) times daily as needed for diarrhea or loose stools.   Polyethyl Glycol-Propyl Glycol (SYSTANE) 0.4-0.3 % SOLN Apply to eye daily.   psyllium (METAMUCIL) 58.6 % packet Take 1 packet by mouth daily.   rosuvastatin (CRESTOR) 20 MG tablet Take 1 tablet (20 mg total) by mouth daily.   Vaginal Lubricant (REPLENS [facility]) Place vaginally as needed.   vitamin C (ASCORBIC ACID) 500  MG tablet Take 500 mg by mouth daily.   No facility-administered encounter medications on file as of 10/10/2021.    Review of Systems  Constitutional:  Negative for appetite change, chills, fatigue, fever and unexpected weight change.  HENT:  Positive for congestion, postnasal drip, sinus pressure and sinus pain. Negative for dental problem, ear discharge, ear pain, facial swelling, hearing loss, nosebleeds, rhinorrhea, sneezing, sore throat, tinnitus and trouble swallowing.   Eyes:  Negative for pain, discharge, redness, itching and visual disturbance.  Respiratory:  Positive for  cough. Negative for chest tightness, shortness of breath and wheezing.   Cardiovascular:  Negative for chest pain, palpitations and leg swelling.  Gastrointestinal:  Negative for abdominal distention, abdominal pain, blood in stool, constipation, diarrhea, nausea and vomiting.  Genitourinary:  Negative for difficulty urinating, dysuria, flank pain, frequency and urgency.  Musculoskeletal:  Negative for arthralgias, back pain, gait problem, joint swelling, myalgias, neck pain and neck stiffness.  Skin:  Negative for color change, pallor, rash and wound.  Neurological:  Negative for dizziness, syncope, speech difficulty, weakness, light-headedness, numbness and headaches.  Hematological:  Does not bruise/bleed easily.  Psychiatric/Behavioral:  Negative for agitation, behavioral problems, confusion, hallucinations, self-injury, sleep disturbance and suicidal ideas. The patient is not nervous/anxious.    Immunization History  Administered Date(s) Administered   Influenza Split 06/24/2014   Influenza, High Dose Seasonal PF 05/29/2013, 05/22/2021   Influenza,inj,Quad PF,6+ Mos 06/10/2015, 05/01/2016, 05/10/2017, 06/01/2018   Influenza-Unspecified 05/24/2013, 06/24/2014, 06/10/2015, 05/01/2016, 06/01/2018, 05/18/2019, 05/25/2020   Moderna Covid-19 Vaccine Bivalent Booster 79yrs & up 05/15/2021   Moderna Sars-Covid-2 Vaccination 09/10/2019, 10/03/2019, 06/25/2020, 11/28/2020   Pneumococcal Conjugate-13 08/25/2015, 10/25/2015   Pneumococcal Polysaccharide-23 03/06/2013   Tdap 08/27/2014   Zoster Recombinat (Shingrix) 11/16/2017, 03/22/2018   Zoster, Live 11/03/2010   Pertinent  Health Maintenance Due  Topic Date Due   FOOT EXAM  Never done   OPHTHALMOLOGY EXAM  Never done   COLONOSCOPY (Pts 45-21yrs Insurance coverage will need to be confirmed)  08/24/2022 (Originally 03/14/2021)   HEMOGLOBIN A1C  03/02/2022   INFLUENZA VACCINE  Completed   DEXA SCAN  Completed   Fall Risk 10/11/2020 12/16/2020  03/19/2021 09/15/2021 10/10/2021  Falls in the past year? 0 0 0 0 0  Was there an injury with Fall? 0 0 - 0 0  Fall Risk Category Calculator 0 0 - 0 0  Fall Risk Category Low Low - Low Low  Patient Fall Risk Level Low fall risk Low fall risk Low fall risk Low fall risk Low fall risk  Patient at Risk for Falls Due to - - - No Fall Risks No Fall Risks  Fall risk Follow up - - Falls evaluation completed Falls evaluation completed Falls evaluation completed   Functional Status Survey:    There were no vitals filed for this visit. There is no height or weight on file to calculate BMI. Physical Exam Vitals reviewed.  Constitutional:      General: She is not in acute distress.    Appearance: Normal appearance. She is normal weight. She is not ill-appearing or diaphoretic.  HENT:     Head: Normocephalic.     Right Ear: Tympanic membrane, ear canal and external ear normal. There is no impacted cerumen.     Left Ear: Tympanic membrane, ear canal and external ear normal. There is no impacted cerumen.     Nose: Rhinorrhea present. No congestion.     Right Sinus: Frontal sinus tenderness present.     Left Sinus:  Frontal sinus tenderness present.     Mouth/Throat:     Mouth: Mucous membranes are moist.     Pharynx: Oropharynx is clear. No oropharyngeal exudate or posterior oropharyngeal erythema.  Eyes:     General: No scleral icterus.       Right eye: No discharge.        Left eye: No discharge.     Extraocular Movements: Extraocular movements intact.     Conjunctiva/sclera: Conjunctivae normal.     Pupils: Pupils are equal, round, and reactive to light.  Cardiovascular:     Rate and Rhythm: Normal rate and regular rhythm.     Pulses: Normal pulses.     Heart sounds: Normal heart sounds. No murmur heard.   No friction rub. No gallop.  Pulmonary:     Effort: Pulmonary effort is normal. No respiratory distress.     Breath sounds: Normal breath sounds. No wheezing, rhonchi or rales.  Chest:      Chest wall: No tenderness.  Abdominal:     General: Bowel sounds are normal. There is no distension.     Palpations: Abdomen is soft. There is no mass.     Tenderness: There is no abdominal tenderness. There is no right CVA tenderness, left CVA tenderness, guarding or rebound.  Musculoskeletal:        General: No swelling or tenderness. Normal range of motion.     Cervical back: Normal range of motion. No rigidity or tenderness.     Right lower leg: No edema.     Left lower leg: No edema.  Lymphadenopathy:     Cervical: No cervical adenopathy.  Skin:    General: Skin is warm and dry.     Coloration: Skin is not pale.     Findings: No erythema or rash.  Neurological:     Mental Status: She is alert and oriented to person, place, and time.     Motor: No weakness.     Gait: Gait normal.  Psychiatric:        Mood and Affect: Mood normal.        Speech: Speech normal.        Behavior: Behavior normal.    Labs reviewed: Recent Labs    11/27/20 1316 05/29/21 1420 09/02/21 0000  NA 141 139 146  K 3.9 4.1 3.9  CL 104 105 102  CO2 23 23 21   GLUCOSE 139* 135*  --   BUN 19 20 12   CREATININE 0.86 0.90 0.6  CALCIUM 9.1 9.4 9.7   Recent Labs    11/27/20 1316 05/29/21 1420 09/02/21 0000  AST 30 59* 108*  ALT 32 54* 91*  ALKPHOS 59 68 82  BILITOT 0.5 0.5  --   PROT 7.4 7.3  --   ALBUMIN 4.1 4.0 4.6   Recent Labs    11/27/20 1316 05/29/21 1420 09/02/21 0000  WBC 7.9 8.2 5.9  NEUTROABS 4.9 5.0  --   HGB 12.8 12.8 13.6  HCT 38.1 37.0 40  MCV 94.3 94.1  --   PLT 211 192 215   Lab Results  Component Value Date   TSH 1.43 11/16/2017   Lab Results  Component Value Date   HGBA1C 6.3 09/02/2021   Lab Results  Component Value Date   CHOL 172 09/02/2021   HDL 52 09/02/2021   LDLCALC 96 09/02/2021   TRIG 120 09/02/2021    Significant Diagnostic Results in last 30 days:  No results found.  Assessment/Plan  1.  Acute cough Afebrile Bilateral lung clear no  wheezing noted.suspect irritation from PND Will rule out COVID-19 patient lives in [facility]  - continue on supportive care with OTC vitamin D and C  Start on Zinc as below - encouraged to increase Fluid/tea or soup  - SARS-COV-2 RNA,(COVID-19) QUAL NAAT - doxycycline (VIBRA-TABS) 100 MG tablet; Take 1 tablet (100 mg total) by mouth 2 (two) times daily for 10 days.  Dispense: 20 tablet; Refill: 0 - zinc gluconate 50 MG tablet; Take 1 tablet (50 mg total) by mouth daily for 14 days.  Dispense: 14 tablet; Refill: 0  2. Sinus pressure Afebrile  - PND - Advised to take over the counter loratadine daily x 14 days   - SARS-COV-2 RNA,(COVID-19) QUAL NAAT  3. Wheezing Reported wheezing last night  Bilateral lungs clear on Auscultation during visit. - Advised to notify provider if symptoms worsen or fail to improve or develops shortness of breath,chest tightness,chest pain or palpitation.  - consider Albuterol inhaler if wheezing persist or Prednisone.  - SARS-COV-2 RNA,(COVID-19) QUAL NAAT  Family/ staff Communication: Reviewed plan of care with patient verbalized understanding   Labs/tests ordered: - SARS-COV-2 RNA,(COVID-19) QUAL NAAT  Next Appointment: As needed if symptoms worsen or fail to improve    Sandrea Hughs, NP

## 2021-10-10 NOTE — Patient Instructions (Signed)
-   Notify provider of go to Ed if symptoms worsen or fail to improve  - increase fluid intake Joaquim Lai /tea

## 2021-10-11 LAB — SARS-COV-2 RNA,(COVID-19) QUALITATIVE NAAT

## 2021-10-12 ENCOUNTER — Encounter: Payer: Self-pay | Admitting: Obstetrics & Gynecology

## 2021-10-13 ENCOUNTER — Other Ambulatory Visit: Payer: Self-pay

## 2021-10-13 ENCOUNTER — Ambulatory Visit: Payer: Medicare Other | Admitting: Obstetrics & Gynecology

## 2021-10-13 ENCOUNTER — Ambulatory Visit: Payer: Medicare Other | Admitting: Family

## 2021-10-13 ENCOUNTER — Ambulatory Visit (INDEPENDENT_AMBULATORY_CARE_PROVIDER_SITE_OTHER): Payer: Medicare Other | Admitting: Family

## 2021-10-13 ENCOUNTER — Encounter: Payer: Self-pay | Admitting: Family

## 2021-10-13 VITALS — BP 158/90 | HR 76 | Temp 97.3°F | Ht 66.0 in

## 2021-10-13 DIAGNOSIS — R062 Wheezing: Secondary | ICD-10-CM

## 2021-10-13 DIAGNOSIS — J3489 Other specified disorders of nose and nasal sinuses: Secondary | ICD-10-CM

## 2021-10-13 DIAGNOSIS — R051 Acute cough: Secondary | ICD-10-CM

## 2021-10-13 MED ORDER — ALBUTEROL SULFATE HFA 108 (90 BASE) MCG/ACT IN AERS: 2.0000 | INHALATION_SPRAY | Freq: Four times a day (QID) | RESPIRATORY_TRACT | 0 refills | Status: DC | PRN

## 2021-10-13 MED ORDER — PREDNISONE 20 MG PO TABS: ORAL_TABLET | ORAL | 0 refills | Status: AC

## 2021-10-13 NOTE — Patient Instructions (Signed)
-   Notify provider if symptoms worsen or having any shortness of breath.

## 2021-10-13 NOTE — Progress Notes (Signed)
\ Provider: Ellene Bloodsaw FNP-C  [name], MD  Patient Care Team: [name], MD as PCP - General (Internal Medicine)  Extended Emergency Contact Information Primary Emergency Contact: [place] Mobile Phone: [phone] Relation: Sister  Code Status:  Full  Goals of care: Advanced Directive information Advanced Directives 10/10/2021  Does Patient Have a Medical Advance Directive? Yes  Type of Paramedic of [place];Living will  Does patient want to make changes to medical advance directive? No - Patient declined  Copy of [place] in Chart? Yes - validated most recent copy scanned in chart (See row information)  Pre-existing out of facility DNR order (yellow form or pink MOST form) -     Chief Complaint  Patient presents with   Acute Visit    Patient states that wheezing is constant now and coughing no better. Coughing to the point of almost vomiting. Patient is coughing up clear phlegm. Patient has post nasal drip. No fever. Symptoms started Thursday.Patient complains of chest tightness and pain. Patient also has headache.    HPI:  Pt is a 76 y.o. female seen today for an acute visit for evaluation of cough and wheezing.she was here on 10/10/2021 with cough,runny nose and PND.COVID-19 test was obtain by staff but lab notify office for possible leakage on tube and need to repeat COVID-19 test.  Has wheezing all the time.leaning forward and sleeping on the side helps with wheezing. Had post nasal drip and runny nose but has improved.She denies any body aches,fatigue, fever,chills,chest tightness,palpitation,chest pain or shortness of breath.  Mucinex  for cough. Felt worse yesterday wanted to go to ED but did not have any aches or SOB so she did not go to ED.  B/p elevated on arrival today but recheck improved.    Past Medical History:  Diagnosis Date   Arthritis    Per [facility] New Patient Packet    Bilateral malignant  neoplasm of breast in female [facility]) 10/18/2018   Breast cancer [facility])    Per [facility] New Patient Packet    Essential hypertension 10/25/2015   High blood pressure    Per [place] New Patient Packet    High cholesterol    Per [place] New Patient Packet    Hyperlipidemia LDL goal <130 10/25/2015   Prediabetes 11/19/2017   Past Surgical History:  Procedure Laterality Date   CATARACT EXTRACTION  2018   Per [place] New Patient Packet, [place]     x2   COLONOSCOPY  2018   Per [place] new patient packet, Dr.Locker   MASTECTOMY Bilateral 02/02/2019   Per [facility] New Patient Packet, DrMichele Blackwood and Dr.Colon    MENISCECTOMY Right 2009   MENISCUS REPAIR  2004   Per [facility] New Patient Packet, Dr. Milagros Reap   TONSILLECTOMY  [phone]   Per [place] Patient Packet    No Known Allergies  Outpatient Encounter Medications as of 10/13/2021  Medication Sig   albuterol (VENTOLIN HFA) 108 (90 Base) MCG/ACT inhaler Inhale 2 puffs into the lungs every 6 (six) hours as needed for wheezing or shortness of breath.   predniSONE (DELTASONE) 20 MG tablet Take 2 tablets (40 mg total) by mouth daily with breakfast for 1 day, THEN 1.5 tablets (30 mg total) daily with breakfast for 1 day, THEN 1 tablet (20 mg total) daily with breakfast for 1 day, THEN 0.5 tablets (10 mg total) daily with breakfast for 2 days.   Azelastine-Fluticasone 137-50  MCG/ACT SUSP SPRAY 1 SPRAY INTO EACH NOSTRIL DAILY   BIOTIN 5000 PO Take 1 capsule by mouth daily.   candesartan-hydrochlorothiazide (ATACAND HCT) 32-12.5 MG tablet Take 1 tablet by mouth daily.   Cholecalciferol (VITAMIN D3) 50 MCG (2000 UT) capsule Take 2,000 Units by mouth daily.   Cyanocobalamin (VITAMIN B-12) 2500 MCG SUBL    doxycycline (VIBRA-TABS) 100 MG tablet Take 1 tablet (100 mg total) by mouth 2 (two) times daily for 10 days.   esomeprazole (NEXIUM) 20 MG capsule Take 20 mg by mouth daily at 12 noon.   loperamide (IMODIUM A-D) 2 MG tablet Take 1 tablet (2 mg  total) by mouth 4 (four) times daily as needed for diarrhea or loose stools.   Polyethyl Glycol-Propyl Glycol (SYSTANE) 0.4-0.3 % SOLN Apply to eye daily.   psyllium (METAMUCIL) 58.6 % packet Take 1 packet by mouth daily.   rosuvastatin (CRESTOR) 20 MG tablet Take 1 tablet (20 mg total) by mouth daily.   Vaginal Lubricant (REPLENS VA) Place vaginally as needed.   vitamin C (ASCORBIC ACID) 500 MG tablet Take 500 mg by mouth daily.   zinc gluconate 50 MG tablet Take 1 tablet (50 mg total) by mouth daily for 14 days.   No facility-administered encounter medications on file as of 10/13/2021.    Review of Systems  Constitutional:  Negative for appetite change, chills, fatigue, fever and unexpected weight change.  HENT:  Negative for congestion, dental problem, ear discharge, ear pain, facial swelling, hearing loss, nosebleeds, postnasal drip, rhinorrhea, sinus pressure, sinus pain, sneezing, sore throat, tinnitus and trouble swallowing.   Eyes:  Negative for pain, discharge, redness, itching and visual disturbance.  Respiratory:  Positive for cough and wheezing. Negative for chest tightness and shortness of breath.   Cardiovascular:  Negative for chest pain, palpitations and leg swelling.  Gastrointestinal:  Negative for abdominal distention, abdominal pain, blood in stool, constipation, diarrhea, nausea and vomiting.  Skin:  Negative for color change, pallor, rash and wound.  Neurological:  Negative for dizziness, syncope, speech difficulty, weakness, light-headedness, numbness and headaches.  Hematological:  Does not bruise/bleed easily.  Psychiatric/Behavioral:  Negative for agitation, behavioral problems, confusion, hallucinations, self-injury, sleep disturbance and suicidal ideas. The patient is not nervous/anxious.    Immunization History  Administered Date(s) Administered   Influenza Split 06/24/2014   Influenza, High Dose Seasonal PF 05/29/2013, 05/22/2021   Influenza,inj,Quad PF,6+ Mos  06/10/2015, 05/01/2016, 05/10/2017, 06/01/2018   Influenza-Unspecified 05/24/2013, 06/24/2014, 06/10/2015, 05/01/2016, 06/01/2018, 05/18/2019, 05/25/2020   Moderna Covid-19 Vaccine Bivalent Booster 71yrs & up 05/15/2021   Moderna Sars-Covid-2 Vaccination 09/10/2019, 10/03/2019, 06/25/2020, 11/28/2020   Pneumococcal Conjugate-13 08/25/2015, 10/25/2015   Pneumococcal Polysaccharide-23 03/06/2013   Tdap 08/27/2014   Zoster Recombinat (Shingrix) 11/16/2017, 03/22/2018   Zoster, Live 11/03/2010   Pertinent  Health Maintenance Due  Topic Date Due   FOOT EXAM  Never done   OPHTHALMOLOGY EXAM  Never done   COLONOSCOPY (Pts 45-19yrs Insurance coverage will need to be confirmed)  08/24/2022 (Originally 03/14/2021)   HEMOGLOBIN A1C  03/02/2022   INFLUENZA VACCINE  Completed   DEXA SCAN  Completed   Fall Risk 10/11/2020 12/16/2020 03/19/2021 09/15/2021 10/10/2021  Falls in the past year? 0 0 0 0 0  Was there an injury with Fall? 0 0 - 0 0  Fall Risk Category Calculator 0 0 - 0 0  Fall Risk Category Low Low - Low Low  Patient Fall Risk Level Low fall risk Low fall risk Low fall risk Low fall  risk Low fall risk  Patient at Risk for Falls Due to - - - No Fall Risks No Fall Risks  Fall risk Follow up - - Falls evaluation completed Falls evaluation completed Falls evaluation completed   Functional Status Survey:    Vitals:   10/13/21 1048 10/13/21 1119  BP: (!) 190/100 (!) 158/90  Pulse: 76   Temp: (!) 97.3 F (36.3 C)   SpO2: 97%   Height: 5\' 6"  (1.676 m)    Body mass index is 29.44 kg/m. Physical Exam Vitals reviewed.  Constitutional:      General: She is not in acute distress.    Appearance: Normal appearance. She is normal weight. She is not ill-appearing or diaphoretic.  HENT:     Head: Normocephalic.  Eyes:     General: No scleral icterus.       Right eye: No discharge.        Left eye: No discharge.     Conjunctiva/sclera: Conjunctivae normal.     Pupils: Pupils are equal,  round, and reactive to light.  Cardiovascular:     Rate and Rhythm: Normal rate and regular rhythm.     Pulses: Normal pulses.     Heart sounds: Normal heart sounds. No murmur heard.   No friction rub. No gallop.  Pulmonary:     Effort: Pulmonary effort is normal. No respiratory distress.     Breath sounds: Examination of the right-upper field reveals wheezing. Examination of the left-upper field reveals wheezing. Examination of the right-middle field reveals wheezing. Examination of the left-middle field reveals wheezing. Examination of the right-lower field reveals wheezing. Examination of the left-lower field reveals wheezing. Wheezing present. No rhonchi or rales.  Chest:     Chest wall: No tenderness.  Abdominal:     General: Bowel sounds are normal. There is no distension.     Palpations: Abdomen is soft. There is no mass.     Tenderness: There is no abdominal tenderness. There is no right CVA tenderness, left CVA tenderness, guarding or rebound.  Musculoskeletal:        General: No swelling or tenderness. Normal range of motion.     Cervical back: Normal range of motion. No rigidity or tenderness.     Right lower leg: No edema.     Left lower leg: No edema.  Lymphadenopathy:     Cervical: No cervical adenopathy.  Skin:    General: Skin is warm and dry.     Coloration: Skin is not pale.     Findings: No erythema or rash.  Neurological:     Mental Status: She is alert and oriented to person, place, and time.     Gait: Gait normal.  Psychiatric:        Mood and Affect: Mood normal.        Speech: Speech normal.        Behavior: Behavior normal.    Labs reviewed: Recent Labs    11/27/20 1316 05/29/21 1420 09/02/21 0000  NA 141 139 146  K 3.9 4.1 3.9  CL 104 105 102  CO2 23 23 21   GLUCOSE 139* 135*  --   BUN 19 20 12   CREATININE 0.86 0.90 0.6  CALCIUM 9.1 9.4 9.7   Recent Labs    11/27/20 1316 05/29/21 1420 09/02/21 0000  AST 30 59* 108*  ALT 32 54* 91*   ALKPHOS 59 68 82  BILITOT 0.5 0.5  --   PROT 7.4 7.3  --  ALBUMIN 4.1 4.0 4.6   Recent Labs    11/27/20 1316 05/29/21 1420 09/02/21 0000  WBC 7.9 8.2 5.9  NEUTROABS 4.9 5.0  --   HGB 12.8 12.8 13.6  HCT 38.1 37.0 40  MCV 94.3 94.1  --   PLT 211 192 215   Lab Results  Component Value Date   TSH 1.43 11/16/2017   Lab Results  Component Value Date   HGBA1C 6.3 09/02/2021   Lab Results  Component Value Date   CHOL 172 09/02/2021   HDL 52 09/02/2021   LDLCALC 96 09/02/2021   TRIG 120 09/02/2021    Significant Diagnostic Results in last 30 days:  No results found.  Assessment/Plan  1. Acute cough Afebrile. Has worsen over the weekend with wheezing.Non-productive.  - Advised to take Mucinex DM as needed for cough  - Albuterol HFA inhaler 2 puffs every 6 hrs as needed for cough or wheezing  - SARS-COV-2 RNA,(COVID-19) QUAL NAAT  2. Sinus pressure Continue on Doxycycline  Repeat COVID-19 test  - SARS-COV-2 RNA,(COVID-19) QUAL NAAT  3. Wheezing Expirational wheezes noted throughout on exam. - start on Albuterol inhaler as above  - Tapered prednisone as directed.  - SARS-COV-2 RNA,(COVID-19) QUAL NAAT - predniSONE (DELTASONE) 20 MG tablet; Take 2 tablets (40 mg total) by mouth daily with breakfast for 1 day, THEN 1.5 tablets (30 mg total) daily with breakfast for 1 day, THEN 1 tablet (20 mg total) daily with breakfast for 1 day, THEN 0.5 tablets (10 mg total) daily with breakfast for 2 days.  Dispense: 5.5 tablet; Refill: 0  Family/ staff Communication: Reviewed plan of care with patient verbalized understanding   Labs/tests ordered: - SARS-COV-2 RNA,(COVID-19) QUAL NAAT  Next Appointment: As needed if symptoms worsen or fail to improve    Sandrea Hughs, NP

## 2021-10-14 LAB — SARS-COV-2 RNA,(COVID-19) QUALITATIVE NAAT: SARS CoV2 RNA: NOT DETECTED

## 2021-10-17 DIAGNOSIS — R051 Acute cough: Secondary | ICD-10-CM

## 2021-10-17 DIAGNOSIS — R062 Wheezing: Secondary | ICD-10-CM

## 2021-10-17 NOTE — Telephone Encounter (Signed)
Cough might linger for 1-2 weeks.continue to use inhaler as needed for wheezing.I will order a chest X-ray to be done at [place] at [facility] then will call you with results. No appointment required it is a walk in.

## 2021-10-22 DIAGNOSIS — H33102 Unspecified retinoschisis, left eye: Secondary | ICD-10-CM | POA: Diagnosis not present

## 2021-10-22 DIAGNOSIS — Z961 Presence of intraocular lens: Secondary | ICD-10-CM | POA: Diagnosis not present

## 2021-10-22 DIAGNOSIS — H26491 Other secondary cataract, right eye: Secondary | ICD-10-CM | POA: Diagnosis not present

## 2021-10-22 DIAGNOSIS — H43392 Other vitreous opacities, left eye: Secondary | ICD-10-CM | POA: Diagnosis not present

## 2021-10-22 DIAGNOSIS — H5203 Hypermetropia, bilateral: Secondary | ICD-10-CM | POA: Diagnosis not present

## 2021-10-23 ENCOUNTER — Other Ambulatory Visit: Payer: Self-pay

## 2021-10-23 ENCOUNTER — Encounter (INDEPENDENT_AMBULATORY_CARE_PROVIDER_SITE_OTHER): Payer: Medicare Other | Admitting: Ophthalmology

## 2021-10-23 DIAGNOSIS — H33103 Unspecified retinoschisis, bilateral: Secondary | ICD-10-CM

## 2021-10-23 DIAGNOSIS — H43813 Vitreous degeneration, bilateral: Secondary | ICD-10-CM | POA: Diagnosis not present

## 2021-10-23 DIAGNOSIS — H35033 Hypertensive retinopathy, bilateral: Secondary | ICD-10-CM | POA: Diagnosis not present

## 2021-10-23 DIAGNOSIS — I10 Essential (primary) hypertension: Secondary | ICD-10-CM

## 2021-10-23 NOTE — Addendum Note (Signed)
Addended by: Rafael Bihari A on: 10/23/2021 11:59 AM ? ? Modules accepted: Orders ? ?

## 2021-10-24 ENCOUNTER — Ambulatory Visit
Admission: RE | Admit: 2021-10-24 | Discharge: 2021-10-24 | Disposition: A | Discharge status: Home / Self Care | Payer: BLUE CROSS/BLUE SHIELD | Source: Ambulatory Visit | Attending: Family | Admitting: Family

## 2021-10-24 DIAGNOSIS — R062 Wheezing: Secondary | ICD-10-CM

## 2021-10-24 DIAGNOSIS — R051 Acute cough: Secondary | ICD-10-CM

## 2021-10-27 ENCOUNTER — Ambulatory Visit (HOSPITAL_COMMUNITY)
Admission: RE | Admit: 2021-10-27 | Discharge: 2021-10-27 | Disposition: A | Discharge status: Home / Self Care | Payer: BLUE CROSS/BLUE SHIELD | Source: Ambulatory Visit | Attending: Hematology | Admitting: Hematology

## 2021-10-27 ENCOUNTER — Other Ambulatory Visit: Payer: Self-pay

## 2021-10-27 DIAGNOSIS — K760 Fatty (change of) liver, not elsewhere classified: Secondary | ICD-10-CM | POA: Diagnosis not present

## 2021-10-27 DIAGNOSIS — K7200 Acute and subacute hepatic failure without coma: Secondary | ICD-10-CM | POA: Insufficient documentation

## 2021-10-27 DIAGNOSIS — K8020 Calculus of gallbladder without cholecystitis without obstruction: Secondary | ICD-10-CM | POA: Diagnosis not present

## 2021-10-27 DIAGNOSIS — C50812 Malignant neoplasm of overlapping sites of left female breast: Secondary | ICD-10-CM | POA: Diagnosis not present

## 2021-11-03 ENCOUNTER — Encounter: Payer: Self-pay | Admitting: Internal Medicine

## 2021-11-03 ENCOUNTER — Ambulatory Visit: Payer: Medicare Other | Admitting: Family

## 2021-11-04 ENCOUNTER — Ambulatory Visit (INDEPENDENT_AMBULATORY_CARE_PROVIDER_SITE_OTHER): Payer: BLUE CROSS/BLUE SHIELD | Admitting: Family

## 2021-11-04 ENCOUNTER — Ambulatory Visit (HOSPITAL_BASED_OUTPATIENT_CLINIC_OR_DEPARTMENT_OTHER)
Admission: RE | Admit: 2021-11-04 | Discharge: 2021-11-04 | Disposition: A | Discharge status: Home / Self Care | Payer: BLUE CROSS/BLUE SHIELD | Source: Ambulatory Visit | Attending: Internal Medicine | Admitting: Internal Medicine

## 2021-11-04 ENCOUNTER — Encounter: Payer: Self-pay | Admitting: Family

## 2021-11-04 ENCOUNTER — Other Ambulatory Visit: Payer: Self-pay

## 2021-11-04 VITALS — BP 150/90 | HR 92 | Temp 97.4°F | Resp 16 | Ht 66.0 in | Wt 178.8 lb

## 2021-11-04 DIAGNOSIS — R1012 Left upper quadrant pain: Secondary | ICD-10-CM | POA: Diagnosis not present

## 2021-11-04 DIAGNOSIS — K760 Fatty (change of) liver, not elsewhere classified: Secondary | ICD-10-CM | POA: Diagnosis not present

## 2021-11-04 DIAGNOSIS — K8020 Calculus of gallbladder without cholecystitis without obstruction: Secondary | ICD-10-CM | POA: Diagnosis not present

## 2021-11-04 DIAGNOSIS — M549 Dorsalgia, unspecified: Secondary | ICD-10-CM

## 2021-11-04 DIAGNOSIS — R350 Frequency of micturition: Secondary | ICD-10-CM

## 2021-11-04 DIAGNOSIS — R8290 Unspecified abnormal findings in urine: Secondary | ICD-10-CM

## 2021-11-04 DIAGNOSIS — R109 Unspecified abdominal pain: Secondary | ICD-10-CM | POA: Diagnosis not present

## 2021-11-04 DIAGNOSIS — E119 Type 2 diabetes mellitus without complications: Secondary | ICD-10-CM | POA: Diagnosis not present

## 2021-11-04 LAB — POCT URINALYSIS DIPSTICK
Bilirubin, UA: NEGATIVE
Glucose, UA: NEGATIVE
Ketones, UA: POSITIVE
Nitrite, UA: NEGATIVE
Protein, UA: NEGATIVE
Spec Grav, UA: 1.015 (ref 1.010–1.025)
Urobilinogen, UA: NEGATIVE E.U./dL — AB
pH, UA: 7.5 (ref 5.0–8.0)

## 2021-11-04 NOTE — Patient Instructions (Signed)
-   Notify provider or go to ED if symptoms worsen  ? ?- Please get Abdominal ultrasound at [facility] ED  ?

## 2021-11-04 NOTE — Progress Notes (Signed)
Provider: Erandy Mceachern FNP-C  [name], MD  Patient Care Team: [name], MD as PCP - General (Internal Medicine)  Extended Emergency Contact Information Primary Emergency Contact: Karmen Bongo Mobile Phone: [phone] Relation: Sister  Code Status: Full code Goals of care: Advanced Directive information Advanced Directives 11/04/2021  Does Patient Have a Medical Advance Directive? Yes  Type of Estate agent of [place];Living will  Does patient want to make changes to medical advance directive? No - Patient declined  Copy of Healthcare Power of Attorney in Chart? Yes - validated most recent copy scanned in chart (See row information)  Pre-existing out of facility DNR order (yellow form or pink MOST form) -     Chief Complaint  Patient presents with   Acute Visit    Patient complains of abdominal pain.    HPI:  Pt is a 76 y.o. female seen today for an acute visit for evaluation of ongoing abdominal pain which was seen last night on the left upper quadrant.  Has not had such pain before.  She could not sleep well last night she tries to sleep on the right side but the pain worsen.  Pain is described as dull achy and reflected to the lower back.  She felt like something was moving to the middle of the upper abdomen when she lied on the right side.slept from 4 am - 5 am then woke up.Has not eaten anything today.She denies any fever, chills nausea or vomiting.Also denies any constipation or diarrhea.she had plans to travel to see her grandchildren out of state.  She had an abdominal ultrasound done 10/27/2021 which showed hepatic steatosis and cholelithiasis.No sonographic evidence of acute cholecystitis  Reported to CMA had increased urine frequency. Bowels are within normal.   Previous cough has resolved.Uses her albuterol inhaler every once a while.    Past Medical History:  Diagnosis Date   Arthritis    Per [facility] New Patient Packet     Bilateral malignant neoplasm of breast in female [facility]) 10/18/2018   Breast cancer [facility])    Per [facility] New Patient Packet    Essential hypertension 10/25/2015   High blood pressure    Per [facility] New Patient Packet    High cholesterol    Per [facility] New Patient Packet    Hyperlipidemia LDL goal <130 10/25/2015   Prediabetes 11/19/2017   Past Surgical History:  Procedure Laterality Date   CATARACT EXTRACTION  2018   Per [facility] New Patient Packet, Dr.Todd Casper Harrison    CESAREAN SECTION     x2   COLONOSCOPY  2018   Per [facility] new patient packet, Dr.Locker   MASTECTOMY Bilateral 02/02/2019   Per [facility] New Patient Packet, DrMichele Blackwood and Dr.Colon    MENISCECTOMY Right 2009   MENISCUS REPAIR  2004   Per [facility] New Patient Packet, Dr. Karolee Stamps   TONSILLECTOMY  [phone]   Per [facility] New Patient Packet    No Known Allergies  Outpatient Encounter Medications as of 11/04/2021  Medication Sig   Azelastine-Fluticasone 137-50 MCG/ACT SUSP SPRAY 1 SPRAY INTO EACH NOSTRIL DAILY   BIOTIN 5000 PO Take 1 capsule by mouth daily.   candesartan-hydrochlorothiazide (ATACAND HCT) 32-12.5 MG tablet Take 1 tablet by mouth daily.   Cholecalciferol (VITAMIN D3) 50 MCG (2000 UT) capsule Take 2,000 Units by mouth daily.   Cyanocobalamin (VITAMIN B-12) 2500 MCG SUBL    esomeprazole (NEXIUM) 20 MG capsule Take 20 mg by mouth daily at 12 noon.  Polyethyl Glycol-Propyl Glycol (SYSTANE) 0.4-0.3 % SOLN Apply to eye daily.   psyllium (METAMUCIL) 58.6 % packet Take 1 packet by mouth daily.   rosuvastatin (CRESTOR) 20 MG tablet Take 1 tablet (20 mg total) by mouth daily.   Vaginal Lubricant (REPLENS VA) Place vaginally as needed.   vitamin C (ASCORBIC ACID) 500 MG tablet Take 500 mg by mouth daily.   [DISCONTINUED] albuterol (VENTOLIN HFA) 108 (90 Base) MCG/ACT inhaler Inhale 2 puffs into the lungs every 6 (six) hours as needed for wheezing or shortness of breath.   [DISCONTINUED] loperamide (IMODIUM A-D) 2 MG tablet Take 1 tablet (2  mg total) by mouth 4 (four) times daily as needed for diarrhea or loose stools.   No facility-administered encounter medications on file as of 11/04/2021.    Review of Systems  Constitutional:  Negative for appetite change, chills, fatigue, fever and unexpected weight change.  Respiratory:  Negative for cough, chest tightness, shortness of breath and wheezing.   Cardiovascular:  Negative for chest pain, palpitations and leg swelling.  Gastrointestinal:  Positive for abdominal pain. Negative for abdominal distention, blood in stool, constipation, diarrhea, nausea and vomiting.       LUQ  Genitourinary:  Positive for frequency. Negative for difficulty urinating, dysuria, flank pain and urgency.  Musculoskeletal:  Negative for arthralgias, back pain, gait problem, joint swelling, myalgias, neck pain and neck stiffness.  Skin:  Negative for color change, pallor, rash and wound.  Neurological:  Negative for dizziness, syncope, speech difficulty, weakness, light-headedness, numbness and headaches.  Hematological:  Does not bruise/bleed easily.  Psychiatric/Behavioral:  Negative for agitation, behavioral problems, confusion, hallucinations, self-injury, sleep disturbance and suicidal ideas. The patient is not nervous/anxious.    Immunization History  Administered Date(s) Administered   Influenza Split 06/24/2014   Influenza, High Dose Seasonal PF 05/29/2013, 05/22/2021   Influenza,inj,Quad PF,6+ Mos 06/10/2015, 05/01/2016, 05/10/2017, 06/01/2018   Influenza-Unspecified 05/24/2013, 06/24/2014, 06/10/2015, 05/01/2016, 06/01/2018, 05/18/2019, 05/25/2020   Moderna Covid-19 Vaccine Bivalent Booster 43yrs & up 05/15/2021   Moderna Sars-Covid-2 Vaccination 09/10/2019, 10/03/2019, 06/25/2020, 11/28/2020   Pneumococcal Conjugate-13 08/25/2015, 10/25/2015   Pneumococcal Polysaccharide-23 03/06/2013   Tdap 08/27/2014   Zoster Recombinat (Shingrix) 11/16/2017, 03/22/2018   Zoster, Live 11/03/2010    Pertinent  Health Maintenance Due  Topic Date Due   FOOT EXAM  Never done   OPHTHALMOLOGY EXAM  Never done   COLONOSCOPY (Pts 45-67yrs Insurance coverage will need to be confirmed)  08/24/2022 (Originally 03/14/2021)   HEMOGLOBIN A1C  03/02/2022   INFLUENZA VACCINE  Completed   DEXA SCAN  Completed   Fall Risk 12/16/2020 03/19/2021 09/15/2021 10/10/2021 11/04/2021  Falls in the past year? 0 0 0 0 0  Was there an injury with Fall? 0 - 0 0 0  Fall Risk Category Calculator 0 - 0 0 0  Fall Risk Category Low - Low Low Low  Patient Fall Risk Level Low fall risk Low fall risk Low fall risk Low fall risk Low fall risk  Patient at Risk for Falls Due to - - No Fall Risks No Fall Risks No Fall Risks  Fall risk Follow up - Falls evaluation completed Falls evaluation completed Falls evaluation completed Falls evaluation completed   Functional Status Survey:    Vitals:   11/04/21 1417  BP: (!) 150/90  Pulse: 92  Resp: 16  Temp: (!) 97.4 F (36.3 C)  SpO2: 97%  Weight: 178 lb 12.8 oz (81.1 kg)  Height: 5\' 6"  (1.676 m)   Body mass index  is 28.86 kg/m. Physical Exam Vitals reviewed.  Constitutional:      General: She is not in acute distress.    Appearance: Normal appearance. She is normal weight. She is not ill-appearing or diaphoretic.  HENT:     Head: Normocephalic.  Eyes:     General: No scleral icterus.       Right eye: No discharge.        Left eye: No discharge.     Conjunctiva/sclera: Conjunctivae normal.     Pupils: Pupils are equal, round, and reactive to light.  Neck:     Vascular: No carotid bruit.  Cardiovascular:     Rate and Rhythm: Normal rate and regular rhythm.     Pulses: Normal pulses.     Heart sounds: Normal heart sounds. No murmur heard.   No friction rub. No gallop.  Pulmonary:     Effort: Pulmonary effort is normal. No respiratory distress.     Breath sounds: Normal breath sounds. No wheezing, rhonchi or rales.  Chest:     Chest wall: No tenderness.   Abdominal:     General: Bowel sounds are normal. There is no distension.     Palpations: Abdomen is soft. There is no mass.     Tenderness: There is no abdominal tenderness. There is no right CVA tenderness, left CVA tenderness, guarding or rebound.     Comments: No tenderness noted.   Musculoskeletal:        General: No swelling or tenderness. Normal range of motion.     Cervical back: Normal range of motion. No rigidity or tenderness.     Right lower leg: No edema.     Left lower leg: No edema.  Lymphadenopathy:     Cervical: No cervical adenopathy.  Skin:    General: Skin is warm and dry.     Coloration: Skin is not pale.     Findings: No erythema or rash.  Neurological:     Mental Status: She is alert and oriented to person, place, and time.     Motor: No weakness.     Gait: Gait normal.  Psychiatric:        Mood and Affect: Mood normal.        Speech: Speech normal.        Behavior: Behavior normal.        Judgment: Judgment normal.    Labs reviewed: Recent Labs    11/27/20 1316 05/29/21 1420 09/02/21 0000  NA 141 139 146  K 3.9 4.1 3.9  CL 104 105 102  CO2 23 23 21   GLUCOSE 139* 135*  --   BUN 19 20 12   CREATININE 0.86 0.90 0.6  CALCIUM 9.1 9.4 9.7   Recent Labs    11/27/20 1316 05/29/21 1420 09/02/21 0000  AST 30 59* 108*  ALT 32 54* 91*  ALKPHOS 59 68 82  BILITOT 0.5 0.5  --   PROT 7.4 7.3  --   ALBUMIN 4.1 4.0 4.6   Recent Labs    11/27/20 1316 05/29/21 1420 09/02/21 0000  WBC 7.9 8.2 5.9  NEUTROABS 4.9 5.0  --   HGB 12.8 12.8 13.6  HCT 38.1 37.0 40  MCV 94.3 94.1  --   PLT 211 192 215   Lab Results  Component Value Date   TSH 1.43 11/16/2017   Lab Results  Component Value Date   HGBA1C 6.3 09/02/2021   Lab Results  Component Value Date   CHOL 172 09/02/2021  HDL 52 09/02/2021   LDLCALC 96 09/02/2021   TRIG 120 09/02/2021    Significant Diagnostic Results in last 30 days:  DG Chest 2 View  Result Date: 10/25/2021 CLINICAL  DATA:  Cough. EXAM: CHEST - 2 VIEW COMPARISON:  None. FINDINGS: The heart size and mediastinal contours are within normal limits. Both lungs are clear. The visualized skeletal structures are unremarkable. IMPRESSION: No active cardiopulmonary disease. Electronically Signed   By: Darliss Cheney M.D.   On: 10/25/2021 15:50   US Abdomen Complete  Result Date: 10/27/2021 CLINICAL DATA:  Elevated liver enzymes EXAM: ABDOMEN ULTRASOUND COMPLETE COMPARISON:  None. FINDINGS: Gallbladder: 1.5 cm mobile stone within the gallbladder. No wall thickening or sonographic Murphy sign. Common bile duct: Diameter: Normal caliber, 2 mm Liver: Increased echotexture compatible with fatty infiltration. No focal abnormality or biliary ductal dilatation. Focal fatty sparing adjacent to the gallbladder fossa. Portal vein is patent on color Doppler imaging with normal direction of blood flow towards the liver. IVC: No abnormality visualized. Pancreas: Visualized portion unremarkable. Spleen: Size and appearance within normal limits. Right Kidney: Length: 12.5 cm 8 mm cyst in the lower pole. Normal echotexture. No hydronephrosis. Left Kidney: Length: 12.3 cm. Echogenicity within normal limits. No mass or hydronephrosis visualized. Abdominal aorta: No aneurysm visualized. Other findings: None. IMPRESSION: Hepatic steatosis. Cholelithiasis.  No sonographic evidence of acute cholecystitis. Electronically Signed   By: Charlett Nose M.D.   On: 10/27/2021 14:35    Assessment/Plan 1. Urinary frequency AFebrile.  Negative exam findings - POC Urinalysis Dipstick indicates the yellow clear urine positive for ketones moderate blood and leukocyte 3+ but negative for nitrites. - Urine Culture  2. Back pain, unspecified back location, unspecified back pain laterality, unspecified chronicity Reports new onset of left upper quadrant pain radiating to the lower back Will rule out UTI - POC Urinalysis Dipstick - Urine Culture  3. Abnormal urine  odor Encouraged to increase water intake - POC Urinalysis Dipstick - Urine Culture  4. Left upper quadrant abdominal pain New onset of pain described as dull on the left upper quadrant and at times to the mid abdomen.  Pain was worse last night unable to sleep.  Reported pain when sitting today during exam.  No nausea or vomiting.  Abdominal ultrasound done on 10/27/2021  showed hepatic steatosis and cholelithiasis but no acute cholecystitis.  Has no history of kidney stone.Will obtain ultrasound would like order sent to [facility] due to new onset of left upper quadrant pain despite recent ultrasound done on 10/27/2021 - POC Urinalysis Dipstick - Urine Culture - CBC with Differential/Platelet - Lipase - CMP with eGFR([facility]) - US Abdomen Complete; Future  Family/ staff Communication: Reviewed plan of care with patient verbalized understanding  Labs/tests ordered:  - POC Urinalysis Dipstick - Urine Culture - CBC with Differential/Platelet - Lipase - CMP with eGFR([facility]) - US Abdomen Complete; Future  Next Appointment: As needed if symptoms worsens or fails to improve  Caesar Bookman, NP

## 2021-11-04 NOTE — Telephone Encounter (Signed)
Initiated Prior Authorization through [employer] [facility]. For Azelastine-Fluticasone.  ?[id number] ?Went into determination. Awaiting Determination.  ?

## 2021-11-05 LAB — URINE CULTURE
MICRO NUMBER:: 13129357
Result:: NO GROWTH
SPECIMEN QUALITY:: ADEQUATE

## 2021-11-05 NOTE — Telephone Encounter (Signed)
Glad you are feeling better today.CT scan of the abdomen results showed no change from the previous one.  Sounds like a good idea to see the orthopedic for your low back pain. ?

## 2021-11-06 ENCOUNTER — Other Ambulatory Visit: Payer: Self-pay | Admitting: Family

## 2021-11-06 NOTE — Telephone Encounter (Signed)
Gastroenterology referral ordered specialist office will call you to schedule appointment.  ?

## 2021-11-06 NOTE — Telephone Encounter (Signed)
Please go to ED to evaluate worsening pain. ?

## 2021-11-07 ENCOUNTER — Other Ambulatory Visit: Payer: Self-pay

## 2021-11-07 DIAGNOSIS — R7309 Other abnormal glucose: Secondary | ICD-10-CM

## 2021-11-07 LAB — COMPLETE METABOLIC PANEL WITH GFR
AG Ratio: 1.6 (calc) (ref 1.0–2.5)
ALT: 42 U/L — ABNORMAL HIGH (ref 6–29)
AST: 37 U/L — ABNORMAL HIGH (ref 10–35)
Albumin: 4.5 g/dL (ref 3.6–5.1)
Alkaline phosphatase (APISO): 67 U/L (ref 37–153)
BUN: 14 mg/dL (ref 7–25)
CO2: 26 mmol/L (ref 20–32)
Calcium: 10 mg/dL (ref 8.6–10.4)
Chloride: 102 mmol/L (ref 98–110)
Creat: 0.67 mg/dL (ref 0.60–1.00)
Globulin: 2.9 g/dL (calc) (ref 1.9–3.7)
Glucose, Bld: 113 mg/dL — ABNORMAL HIGH (ref 65–99)
Potassium: 3.9 mmol/L (ref 3.5–5.3)
Sodium: 140 mmol/L (ref 135–146)
Total Bilirubin: 0.6 mg/dL (ref 0.2–1.2)
Total Protein: 7.4 g/dL (ref 6.1–8.1)
eGFR: 91 mL/min/{1.73_m2} (ref >=60)

## 2021-11-07 LAB — CBC WITH DIFFERENTIAL/PLATELET
Absolute Monocytes: 392 cells/uL (ref 200–950)
Basophils Absolute: 50 cells/uL (ref 0–200)
Basophils Relative: 0.9 %
Eosinophils Absolute: 101 cells/uL (ref 15–500)
Eosinophils Relative: 1.8 %
HCT: 41 % (ref 35.0–45.0)
Hemoglobin: 13.8 g/dL (ref 11.7–15.5)
Lymphs Abs: 2027 cells/uL (ref 850–3900)
MCH: 32.4 pg (ref 27.0–33.0)
MCHC: 33.7 g/dL (ref 32.0–36.0)
MCV: 96.2 fL (ref 80.0–100.0)
MPV: 12.5 fL (ref 7.5–12.5)
Monocytes Relative: 7 %
Neutro Abs: 3030 cells/uL (ref 1500–7800)
Neutrophils Relative %: 54.1 %
Platelets: 209 10*3/uL (ref 140–400)
RBC: 4.26 10*6/uL (ref 3.80–5.10)
RDW: 12.5 % (ref 11.0–15.0)
Total Lymphocyte: 36.2 %
WBC: 5.6 10*3/uL (ref 3.8–10.8)

## 2021-11-07 LAB — HEMOGLOBIN A1C
Hgb A1c MFr Bld: 6.7 % of total Hgb — ABNORMAL HIGH (ref <5.7)
Mean Plasma Glucose: 146 mg/dL
eAG (mmol/L): 8.1 mmol/L

## 2021-11-07 LAB — TEST AUTHORIZATION

## 2021-11-07 LAB — LIPASE: Lipase: 22 U/L (ref 7–60)

## 2021-11-10 ENCOUNTER — Encounter: Payer: Self-pay | Admitting: Family

## 2021-11-10 ENCOUNTER — Other Ambulatory Visit: Payer: Self-pay

## 2021-11-10 ENCOUNTER — Ambulatory Visit (INDEPENDENT_AMBULATORY_CARE_PROVIDER_SITE_OTHER): Payer: BLUE CROSS/BLUE SHIELD | Admitting: Family

## 2021-11-10 ENCOUNTER — Telehealth: Payer: Self-pay | Admitting: *Deleted

## 2021-11-10 VITALS — BP 120/80 | HR 70 | Temp 97.5°F | Resp 16 | Ht 66.0 in | Wt 181.4 lb

## 2021-11-10 DIAGNOSIS — B029 Zoster without complications: Secondary | ICD-10-CM | POA: Diagnosis not present

## 2021-11-10 MED ORDER — VALACYCLOVIR HCL 1 G PO TABS: 1000.0000 mg | ORAL_TABLET | Freq: Two times a day (BID) | ORAL | 0 refills | Status: AC

## 2021-11-10 NOTE — Telephone Encounter (Signed)
Patient called and stated that she saw Dinah this morning and was told if she didn't here from the GI Dr. Today to let you know.  ? ? ?Patient is calling to let you know that she has NOT heard anything from GI yet.  ? ?FYI ?

## 2021-11-10 NOTE — Progress Notes (Signed)
? ?Provider: Marlowe Sax FNP-C ? ?[name], MD ? ?Patient Care Team: ?[name], MD as PCP - General (Internal Medicine) ? ?Extended Emergency Contact Information ?Primary Emergency Contact: [place] ?Mobile Phone: [phone] ?Relation: Sister ? ?Code Status:  Full Code  ?Goals of care: Advanced Directive information ?Advanced Directives 11/10/2021  ?Does Patient Have a Medical Advance Directive? Yes  ?Type of Paramedic of [place];Living will  ?Does patient want to make changes to medical advance directive? No - Patient declined  ?Copy of [place] in Chart? Yes - validated most recent copy scanned in chart (See row information)  ?Pre-existing out of facility DNR order (yellow form or pink MOST form) -  ? ? ? ?Chief Complaint  ?Patient presents with  ? Follow-up  ?  Patient complains of rash located on the middle of abdomen running towards her back. Rash started on Friday night 11/07/2021.  ? ? ?HPI:  ?Pt is a 76 y.o. female seen today for an acute visit for evaluation of rash on middle of her left side abdomen running towards the back since Friday, 11/07/2021.  States had blisters on Friday. Has been itchy so she applied calamine calamine cream which has helped with the itching.  The rash towards the back was also tingling.She denies any fever or chills. ?Has had shingles vaccine. On the contact with the rash.  Has not changed her detergent ,laundry soap or lotion. ?States still awaiting to see gastroenterology as for reason elevated liver enzymes. Still has left upper quad abdominal pain which is worse at night when she is sleeping. She denies any nausea or vomiting. ? ?Past Medical History:  ?Diagnosis Date  ? Arthritis   ? Per [facility] New Patient Packet   ? Bilateral malignant neoplasm of breast in female [facility]) 10/18/2018  ? Breast cancer ([place])   ? Per [facility] New Patient Packet   ? Essential hypertension 10/25/2015  ? High blood pressure   ? Per [facility] New  Patient Packet   ? High cholesterol   ? Per [facility] New Patient Packet   ? Hyperlipidemia LDL goal <130 10/25/2015  ? Prediabetes 11/19/2017  ? ?Past Surgical History:  ?Procedure Laterality Date  ? CATARACT EXTRACTION  2018  ? Per [facility] New Patient Packet, Dr.Todd Einar Gip   ? CESAREAN SECTION    ? x2  ? COLONOSCOPY  2018  ? Per [place] new patient packet, Dr.Locker  ? MASTECTOMY Bilateral 02/02/2019  ? Per [facility] New Patient Packet, DrMichele Blackwood and Dr.Colon   ? MENISCECTOMY Right 2009  ? MENISCUS REPAIR  2004  ? Per [facility] New Patient Packet, Dr. Milagros Reap  ? TONSILLECTOMY  1951  ? Per [facility] New Patient Packet  ? ? ?No Known Allergies ? ?Outpatient Encounter Medications as of 11/10/2021  ?Medication Sig  ? Azelastine-Fluticasone 137-50 MCG/ACT SUSP SPRAY 1 SPRAY INTO EACH NOSTRIL DAILY  ? BIOTIN 5000 PO Take 1 capsule by mouth daily.  ? candesartan-hydrochlorothiazide (ATACAND HCT) 32-12.5 MG tablet Take 1 tablet by mouth daily.  ? Cholecalciferol (VITAMIN D3) 50 MCG (2000 UT) capsule Take 2,000 Units by mouth daily.  ? Cyanocobalamin (VITAMIN B-12) 2500 MCG SUBL   ? esomeprazole (NEXIUM) 20 MG capsule Take 20 mg by mouth daily at 12 noon.  ? Polyethyl Glycol-Propyl Glycol (SYSTANE) 0.4-0.3 % SOLN Apply to eye daily.  ? psyllium (METAMUCIL) 58.6 % packet Take 1 packet by mouth daily.  ? rosuvastatin (CRESTOR) 20 MG tablet Take 1 tablet (20  mg total) by mouth daily.  ? Vaginal Lubricant (REPLENS VA) Place vaginally as needed.  ? vitamin C (ASCORBIC ACID) 500 MG tablet Take 500 mg by mouth daily.  ? ?No facility-administered encounter medications on file as of 11/10/2021.  ? ? ?Review of Systems  ?Constitutional:  Negative for appetite change, chills, fatigue, fever and unexpected weight change.  ?HENT:  Negative for congestion, postnasal drip, rhinorrhea, sinus pressure, sinus pain, sneezing and sore throat.   ?Respiratory:  Negative for cough, chest tightness, shortness of breath and wheezing.   ?Cardiovascular:  Negative for  chest pain, palpitations and leg swelling.  ?Gastrointestinal:  Positive for abdominal pain. Negative for abdominal distention, blood in stool, constipation, diarrhea, nausea and vomiting.  ?     Ongoing left upper quadrant pain  ?Genitourinary:  Negative for difficulty urinating, dysuria, flank pain, frequency and urgency.  ?Musculoskeletal:  Negative for arthralgias, back pain, gait problem, joint swelling and myalgias.  ?Skin:  Positive for rash. Negative for color change and pallor.  ?     Left side of abdomen and towards the back rash  ?Neurological:  Negative for dizziness, speech difficulty, weakness, light-headedness, numbness and headaches.  ? ?Immunization History  ?Administered Date(s) Administered  ? Influenza Split 06/24/2014  ? Influenza, High Dose Seasonal PF 05/29/2013, 05/22/2021  ? Influenza,inj,Quad PF,6+ Mos 06/10/2015, 05/01/2016, 05/10/2017, 06/01/2018  ? Influenza-Unspecified 05/24/2013, 06/24/2014, 06/10/2015, 05/01/2016, 06/01/2018, 05/18/2019, 05/25/2020  ? Moderna Covid-19 Vaccine Bivalent Booster 29yr & up 05/15/2021  ? Moderna Sars-Covid-2 Vaccination 09/10/2019, 10/03/2019, 06/25/2020, 11/28/2020  ? Pneumococcal Conjugate-13 08/25/2015, 10/25/2015  ? Pneumococcal Polysaccharide-23 03/06/2013  ? Tdap 08/27/2014  ? Zoster Recombinat (Shingrix) 11/16/2017, 03/22/2018  ? Zoster, Live 11/03/2010  ? ?Pertinent  Health Maintenance Due  ?Topic Date Due  ? FOOT EXAM  Never done  ? OPHTHALMOLOGY EXAM  Never done  ? COLONOSCOPY (Pts 45-456yrInsurance coverage will need to be confirmed)  08/24/2022 (Originally 03/14/2021)  ? HEMOGLOBIN A1C  05/07/2022  ? INFLUENZA VACCINE  Completed  ? DEXA SCAN  Completed  ? ?Fall Risk 03/19/2021 09/15/2021 10/10/2021 11/04/2021 11/10/2021  ?Falls in the past year? 0 0 0 0 0  ?Was there an injury with Fall? - 0 0 0 0  ?Fall Risk Category Calculator - 0 0 0 0  ?Fall Risk Category - Low Low Low Low  ?Patient Fall Risk Level Low fall risk Low fall risk Low fall risk Low  fall risk Low fall risk  ?Patient at Risk for Falls Due to - No Fall Risks No Fall Risks No Fall Risks No Fall Risks  ?Fall risk Follow up Falls evaluation completed Falls evaluation completed Falls evaluation completed Falls evaluation completed Falls evaluation completed  ? ?Functional Status Survey: ?  ? ?Vitals:  ? 11/10/21 0910  ?BP: 120/80  ?Pulse: 70  ?Resp: 16  ?Temp: (!) 97.5 ?F (36.4 ?C)  ?SpO2: 97%  ?Weight: 181 lb 6.4 oz (82.3 kg)  ?Height: '5\' 6"'$  (1.676 m)  ? ?Body mass index is 29.28 kg/m?. Marland KitchenPhysical Exam ?Vitals reviewed.  ?Constitutional:   ?   General: She is not in acute distress. ?   Appearance: Normal appearance. She is normal weight. She is not ill-appearing or diaphoretic.  ?HENT:  ?   Mouth/Throat:  ?   Mouth: Mucous membranes are moist.  ?   Pharynx: Oropharynx is clear. No oropharyngeal exudate or posterior oropharyngeal erythema.  ?Eyes:  ?   General: No scleral icterus.    ?   Right eye: No  discharge.     ?   Left eye: No discharge.  ?   Conjunctiva/sclera: Conjunctivae normal.  ?   Pupils: Pupils are equal, round, and reactive to light.  ?Cardiovascular:  ?   Rate and Rhythm: Normal rate and regular rhythm.  ?   Pulses: Normal pulses.  ?   Heart sounds: Normal heart sounds. No murmur heard. ?  No friction rub. No gallop.  ?Pulmonary:  ?   Effort: Pulmonary effort is normal. No respiratory distress.  ?   Breath sounds: Normal breath sounds. No wheezing, rhonchi or rales.  ?Chest:  ?   Chest wall: No tenderness.  ?Skin: ?   General: Skin is warm and dry.  ?   Coloration: Skin is not pale.  ?   Findings: Erythema and rash present. No bruising or lesion.  ?   Comments: Left upper quadrant abdomen and left side towards the back red raised rash with some blisters open.  Advised following similar dermatome.Non tender to touch.Whitish calamine cream noted.  ?Neurological:  ?   Mental Status: She is alert and oriented to person, place, and time.  ?   Motor: No weakness.  ?   Gait: Gait normal.   ?Psychiatric:     ?   Mood and Affect: Mood normal.     ?   Speech: Speech normal.     ?   Behavior: Behavior normal.  ? ? ?Labs reviewed: ?Recent Labs  ?  11/27/20 ?1316 05/29/21 ?1420 09/02/21 ?0000 03/1

## 2021-11-10 NOTE — Patient Instructions (Signed)
Shingles ?Shingles, which is also known as herpes zoster, is an infection that causes a painful skin rash and fluid-filled blisters. It is caused by a virus. ?Shingles only develops in people who: ?Have had chickenpox. ?Have been vaccinated against chickenpox. Shingles is rare in this group. ?What are the causes? ?Shingles is caused by varicella-zoster virus. This is the same virus that causes chickenpox. After a person is exposed to the virus, it stays in the body in an inactive (dormant) state. Shingles develops if the virus is reactivated. This can happen many years after the first (initial) exposure to the virus. It is not known what causes this virus to be reactivated. ?What increases the risk? ?People who have had chickenpox or received the chickenpox vaccine are at risk for shingles. Shingles infection is more common in people who: ?Are older than 76 years of age. ?Have a weakened disease-fighting system (immune system), such as people with: ?HIV (human immunodeficiency virus). ?AIDS (acquired immunodeficiency syndrome). ?Cancer. ?Are taking medicines that weaken the immune system, such as organ transplant medicines. ?Are experiencing a lot of stress. ?What are the signs or symptoms? ?Early symptoms of this condition include itching, tingling, and pain in an area on your skin. Pain may be described as burning, stabbing, or throbbing. ?A few days or weeks after early symptoms start, a painful red rash appears. The rash is usually on one side of the body and has a band-like or belt-like pattern. The rash eventually turns into fluid-filled blisters that break open, change into scabs, and dry up in about 2-3 weeks. ?At any time during the infection, you may also develop: ?A fever. ?Chills. ?A headache. ?Nausea. ?How is this diagnosed? ?This condition is diagnosed with a skin exam. Skin or fluid samples (a culture) may be taken from the blisters before a diagnosis is made. ?How is this treated? ?The rash may last  for several weeks. There is not a specific cure for this condition. Your health care provider may prescribe medicines to help you manage pain, recover more quickly, and avoid long-term problems. Medicines may include: ?Antiviral medicines. ?Anti-inflammatory medicines. ?Pain medicines. ?Anti-itching medicines (antihistamines). ?If the area involved is on your face, you may be referred to a specialist, such as an eye doctor (ophthalmologist) or an ear, nose, and throat (ENT) doctor (otorhinolaryngologist) to help you avoid eye problems, chronic pain, or disability. ?Follow these instructions at home: ?Medicines ?Take over-the-counter and prescription medicines only as told by your health care provider. ?Apply an anti-itch cream or numbing cream to the affected area as told by your health care provider. ?Relieving itching and discomfort ? ?Apply cold, wet cloths (cold compresses) to the area of the rash or blisters as told by your health care provider. ?Cool baths can be soothing. Try adding baking soda or dry oatmeal to the water to reduce itching. Do not bathe in hot water. ?Use calamine lotion as recommended by your health care provider. This is an over-the-counter lotion that helps to relieve itchiness. ?Blister and rash care ?Keep your rash covered with a loose bandage (dressing). Wear loose-fitting clothing to help ease the pain of material rubbing against the rash. ?Wash your hands with soap and water for at least 20 seconds before and after you change your dressing. If soap and water are not available, use hand sanitizer. ?Change your dressing as told by your health care provider. ?Keep your rash and blisters clean by washing the area with mild soap and cool water as told by your health   care provider. ?Check your rash every day for signs of infection. Check for: ?More redness, swelling, or pain. ?Fluid or blood. ?Warmth. ?Pus or a bad smell. ?Do not scratch your rash or pick at your blisters. To help avoid  scratching: ?Keep your fingernails clean and cut short. ?Wear gloves or mittens while you sleep, if scratching is a problem. ?General instructions ?Rest as told by your health care provider. ?Wash your hands often with soap and water for at least 20 seconds. If soap and water are not available, use hand sanitizer. Doing this lowers your chance of getting a bacterial skin infection. ?Before your blisters change into scabs, your shingles infection can cause chickenpox in people who have never had it or have never been vaccinated against it. To prevent this from happening, avoid contact with other people, especially: ?Babies. ?Pregnant women. ?Children who have eczema. ?Older people who have transplants. ?People who have chronic illnesses, such as cancer or AIDS. ?Keep all follow-up visits. This is important. ?How is this prevented? ?Getting vaccinated is the best way to prevent shingles and protect against shingles complications. If you have not been vaccinated, talk with your health care provider about getting the vaccine. ?Where to find more information ?Centers for Disease Control and Prevention: www.cdc.gov ?Contact a health care provider if: ?Your pain is not relieved with prescribed medicines. ?Your pain does not get better after the rash heals. ?You have any of these signs of infection: ?More redness, swelling, or pain around the rash. ?Fluid or blood coming from the rash. ?Warmth coming from your rash. ?Pus or a bad smell coming from the rash. ?A fever. ?Get help right away if: ?The rash is on your face or nose. ?You have facial pain, pain around your eye area, or loss of feeling on one side of your face. ?You have difficulty seeing. ?You have ear pain or have ringing in your ear. ?You have a loss of taste. ?Your condition gets worse. ?Summary ?Shingles, also known as herpes zoster, is an infection that causes a painful skin rash and fluid-filled blisters. ?This condition is diagnosed with a skin exam. Skin or  fluid samples (a culture) may be taken from the blisters. ?Keep your rash covered with a loose bandage (dressing). Wear loose-fitting clothing to help ease the pain of material rubbing against the rash. ?Before your blisters change into scabs, your shingles infection can cause chickenpox in people who have never had it or have never been vaccinated against it. ?This information is not intended to replace advice given to you by your health care provider. Make sure you discuss any questions you have with your health care provider. ?Document Revised: 08/05/2020 Document Reviewed: 08/05/2020 ?Elsevier Patient Education ? 2022 Elsevier Inc. ? ?

## 2021-11-10 NOTE — Telephone Encounter (Signed)
Message forwarded to [facility] ?

## 2021-11-10 NOTE — Telephone Encounter (Signed)
See if Victoria Wolfe can give her the GI number or verify if referral was received.  ?

## 2021-11-11 ENCOUNTER — Telehealth: Payer: Self-pay | Admitting: *Deleted

## 2021-11-11 NOTE — Telephone Encounter (Signed)
Patient annual exam is scheduled on 11/13/21 patient called today stating she has a mild case of shingles on her stomach. She really would like to keep this appointment, she asked if you are comfortable with her still coming to visit?  ?

## 2021-11-11 NOTE — Telephone Encounter (Signed)
Dr.Lavoie replied "Yes, keep the appointment " ? ? ?Patient aware.  ?

## 2021-11-12 ENCOUNTER — Encounter: Payer: Self-pay | Admitting: Gastroenterology

## 2021-11-12 NOTE — Telephone Encounter (Signed)
Kathyrn Lass K  You 17 hours ago (3:26 PM)  ? ?LM ?Done, spoke with pt this morning & gave # to [facility] for scheduling  ?Thanks, Lattie Haw   ? ?

## 2021-11-13 ENCOUNTER — Ambulatory Visit (INDEPENDENT_AMBULATORY_CARE_PROVIDER_SITE_OTHER): Payer: Medicare Other | Admitting: Obstetrics & Gynecology

## 2021-11-13 ENCOUNTER — Encounter: Payer: Self-pay | Admitting: Obstetrics & Gynecology

## 2021-11-13 ENCOUNTER — Other Ambulatory Visit: Payer: Self-pay

## 2021-11-13 VITALS — BP 110/78 | HR 70 | Resp 16 | Ht 66.75 in | Wt 178.0 lb

## 2021-11-13 DIAGNOSIS — Z853 Personal history of malignant neoplasm of breast: Secondary | ICD-10-CM

## 2021-11-13 DIAGNOSIS — Z9289 Personal history of other medical treatment: Secondary | ICD-10-CM

## 2021-11-13 DIAGNOSIS — Z170 Estrogen receptor positive status [ER+]: Secondary | ICD-10-CM

## 2021-11-13 DIAGNOSIS — Z01419 Encounter for gynecological examination (general) (routine) without abnormal findings: Secondary | ICD-10-CM

## 2021-11-13 DIAGNOSIS — C50919 Malignant neoplasm of unspecified site of unspecified female breast: Secondary | ICD-10-CM

## 2021-11-13 DIAGNOSIS — Z780 Asymptomatic menopausal state: Secondary | ICD-10-CM

## 2021-11-13 NOTE — Progress Notes (Signed)
? ? ?Victoria Wolfe 1945/10/12 [id number] ? ? ?History:    76 y.o. G2P2L2  Moved to [facility]. ?  ?RP:  Established patient presenting for Annual Gyn exam ?  ?HPI:  Postmenopause, well on no HRT.  No pelvic pain.  Abstinent. Pap Neg 09/2019. Dx of Stage 1 Breast Ca ER pos in 2020.  BrCa1-2 Negative.  Had Bilateral Mastectomy and reconstruction in 2020.  Stopped Tamoxifen in 08/2021.  No PMB.  Colonoscopy 02/2018 Polyps, on a 3-5 yr schedule.  BD normal in 09/2019. BMI 28.09.  Health labs with Fam MD. ? ? ?Past medical history,surgical history, family history and social history were all reviewed and documented in the EPIC chart. ? ?Gynecologic History ?No LMP recorded. Patient is postmenopausal. ? ?Obstetric History ?OB History  ?Gravida Para Term Preterm AB Living  ?_0 0 2  ?SAB IAB Ectopic Multiple Live Births  ?0   0      ?  ?# Outcome Date GA Lbr Len/2nd Weight Sex Delivery Anes PTL Lv  ?2 Term           ?1 Term           ? ? ? ?ROS: A ROS was performed and pertinent positives and negatives are included in the history. ? GENERAL: No fevers or chills. HEENT: No change in vision, no earache, sore throat or sinus congestion. NECK: No pain or stiffness. CARDIOVASCULAR: No chest pain or pressure. No palpitations. PULMONARY: No shortness of breath, cough or wheeze. GASTROINTESTINAL: No abdominal pain, nausea, vomiting or diarrhea, melena or bright red blood per rectum. GENITOURINARY: No urinary frequency, urgency, hesitancy or dysuria. MUSCULOSKELETAL: No joint or muscle pain, no back pain, no recent trauma. DERMATOLOGIC: No rash, no itching, no lesions. ENDOCRINE: No polyuria, polydipsia, no heat or cold intolerance. No recent change in weight. HEMATOLOGICAL: No anemia or easy bruising or bleeding. NEUROLOGIC: No headache, seizures, numbness, tingling or weakness. PSYCHIATRIC: No depression, no loss of interest in normal activity or change in sleep pattern.  ?  ? ?Exam: ? ? ?BP 110/78   Pulse 70    Resp 16   Ht 5' 6.75" (1.695 m)   Wt 178 lb (80.7 kg)   BMI 28.09 kg/m?  ? ?Body mass index is 28.09 kg/m?. ? ?General appearance : Well developed well nourished female. No acute distress ?HEENT: Eyes: no retinal hemorrhage or exudates,  Neck supple, trachea midline, no carotid bruits, no thyroidmegaly ?Lungs: Clear to auscultation, no rhonchi or wheezes, or rib retractions  ?Heart: Regular rate and rhythm, no murmurs or gallops ?Breast:Examined in sitting and supine position were symmetrical in appearance, no palpable masses or tenderness,  no skin retraction, no nipple inversion, no nipple discharge, no skin discoloration, no axillary or supraclavicular lymphadenopathy ?Abdomen: no palpable masses or tenderness, no rebound or guarding ?Extremities: no edema or skin discoloration or tenderness ? ?Pelvic: Vulva: Normal ?            Vagina: No gross lesions or discharge ? Cervix: No gross lesions or discharge ? Uterus  AV, normal size, shape and consistency, non-tender and mobile ? Adnexa  Without masses or tenderness ? Anus: Normal ? ? ?Assessment/Plan:  76 y.o. female for annual exam  ? ?1. Well female exam with routine gynecological exam ?Postmenopause, well on no HRT.  No pelvic pain.  Abstinent. Pap Neg 09/2019. Dx of Stage 1 Breast Ca ER pos in 2020.  BrCa1-2 Negative.  Had Bilateral Mastectomy and  reconstruction in 2020.  Stopped Tamoxifen in 08/2021.  No PMB.  Colonoscopy 02/2018 Polyps, on a 3-5 yr schedule.  BD normal in 09/2019. BMI 28.09.  Health labs with Fam MD. ? ?2. Personal history of breast cancer ?Left Breast Ca ER pos, Dxed in 2020. ? ?3. 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  BrCa1-2 Negative.  Had Bilateral Mastectomy and reconstruction in 2020.  Stopped Tamoxifen in 08/2021.  No PMB.   ? ?4. Postmenopause ?Postmenopause, well on no HRT.  No pelvic pain.   Abstinent. BD normal in 09/2019. ? ?5. Personal history of other medical treatment  ? ?Princess Bruins MD, 11:37 AM 11/13/2021 ? ?  ?

## 2021-11-27 ENCOUNTER — Other Ambulatory Visit: Payer: Medicare Other

## 2021-11-27 ENCOUNTER — Ambulatory Visit: Payer: Medicare Other | Admitting: Hematology

## 2021-12-01 ENCOUNTER — Ambulatory Visit (INDEPENDENT_AMBULATORY_CARE_PROVIDER_SITE_OTHER): Payer: BLUE CROSS/BLUE SHIELD | Admitting: Nurse Practitioner

## 2021-12-01 ENCOUNTER — Encounter: Payer: Self-pay | Admitting: Nurse Practitioner

## 2021-12-01 ENCOUNTER — Other Ambulatory Visit (INDEPENDENT_AMBULATORY_CARE_PROVIDER_SITE_OTHER): Payer: BLUE CROSS/BLUE SHIELD

## 2021-12-01 ENCOUNTER — Ambulatory Visit: Payer: Medicare Other | Admitting: Internal Medicine

## 2021-12-01 VITALS — BP 128/70 | HR 78 | Ht 66.0 in | Wt 182.4 lb

## 2021-12-01 DIAGNOSIS — R7401 Elevation of levels of liver transaminase levels: Secondary | ICD-10-CM | POA: Diagnosis not present

## 2021-12-01 DIAGNOSIS — R7989 Other specified abnormal findings of blood chemistry: Secondary | ICD-10-CM | POA: Diagnosis not present

## 2021-12-01 DIAGNOSIS — R945 Abnormal results of liver function studies: Secondary | ICD-10-CM | POA: Diagnosis not present

## 2021-12-01 LAB — IBC + FERRITIN
Ferritin: 271.1 ng/mL (ref 10.0–291.0)
Iron: 70 ug/dL (ref 42–145)
Saturation Ratios: 20 % (ref 20.0–50.0)
TIBC: 350 ug/dL (ref 250.0–450.0)
Transferrin: 250 mg/dL (ref 212.0–360.0)

## 2021-12-01 LAB — PROTIME-INR
INR: 1 ratio (ref 0.8–1.0)
Prothrombin Time: 10.6 s (ref 9.6–13.1)

## 2021-12-01 NOTE — Progress Notes (Signed)
? ? ?Assessment  ? ?Patient profile:  ?Victoria Wolfe is a 76 y.o. female new to practice, referred by PCP for abnormal liver tests. Her past medical history is significant for HLD, HTN, breast cancer s/p bilateral mastectomies, shingles, cholelithiasis, hepatic steatosis See PMH below for any additional history ? ?Abnormal liver enzymes over the last year, generally no more than 3 x ULN. Suspect secondary to fatty liver as demonstrated on [place].  ?History of colon polyps in July 2019 in [place]. Records previously reviewed by Dr. Tarri Glenn. Surveillance colonoscopy recommended July 2024. Victoria Wolfe was diagnosed with colon cancer but at age 20.  ? ?Plan  ? ?Discussed steatosis including her risk factors for it ( obesity / Etoh). Recommend weight loss with a low carbohydrate diet ?Obtain INR ?Check lab for HAV, HBV immunity.  ?No Etoh .  ?Obtain hepatic serologic workup to evaluate for other etiologies of mildly elevated liver enzymes ?Follow up Dr. Tarri Glenn 3 months. ? ?History of Present Illness  ? ?Chief complaint: elevated liver enzymes ? ?Patient moved here from [place] in 2020. Her previous colonoscopy / polyp path report from [place] in 2019 were reviewed by Dr. Tarri Glenn at the time. It was recommended she have a surveillance colonoscopy in July 2024.  ? ?Interval History:  ?Three weeks ago Victoria Wolfe suddenly developed mid upper abdominal pain. Pain was dull, didn't radiate to her back. The pain lasted a couple of days. It had no relationship to eating, it didn't get better with bowel movements  A few days later she broke out with shingles involving leff abdomen  and left side. Saw PCP, prescribed Valtrex. Labs drawn and liver tests were abnormal.  ? ?Interval History:  ?She took Valtrex and the skin lesions and abdominal pain have nearly resolved.  Her liver enzymes have been elevated for slightly over a year. For the most part they have been less than 2x ULN. For years she has drank two 12 oz glasses of wine a night.  No [place] of liver disease.  She has gained weight but only about 8 pounds over the last year.  She feels okay,.  Recent US shows cholelithiasis and steatosis ? ?Previous Labs / Imaging:: ? ?  Latest Ref Rng & Units 11/04/2021  ?  2:46 PM 09/02/2021  ? 12:00 AM 05/29/2021  ?  2:20 PM  ?CBC  ?WBC 3.8 - 10.8 Thousand/uL 5.6   5.9      8.2    ?Hemoglobin 11.7 - 15.5 g/dL 13.8   13.6      12.8    ?Hematocrit 35.0 - 45.0 % 41.0   40      37.0    ?Platelets 140 - 400 Thousand/uL 209   215      192    ?  ? This result is from an external source.  ? ? ?Lab Results  ?Component Value Date  ? LIPASE 22 11/04/2021  ? ? ? ?FIB4 2.05 ( may be less reliable given her age).  ?APRI 0.5 ( unable to determine severity) ? ?Previous GI Evaluations  ? ?Endoscopies: ? July 2019 colonoscopy BBPS 6 ( adequate).  ?--0.6 cm rectosigmoid polyp, diverticulosis, two polyps 0.4cm at splenic flexure.  ? ?Imaging:  ?US Abdomen Complete ?CLINICAL DATA:  Left upper quadrant pain ? ?EXAM: ?ABDOMEN ULTRASOUND COMPLETE ? ?COMPARISON:  Ultrasound 10/27/2021 ? ?FINDINGS: ?Gallbladder: 2 mobile gallstones measuring up to 11 mm. No wall ?thickening or sonographic Murphy sign. ? ?Common bile duct: Diameter: Normal caliber, 7 mm ? ?Liver:  Increased echotexture compatible with fatty infiltration. No ?focal abnormality or biliary ductal dilatation. Portal vein is ?patent on color Doppler imaging with normal direction of blood flow ?towards the liver. ? ?IVC: No abnormality visualized. ? ?Pancreas: Visualized portion unremarkable. ? ?Spleen: Size and appearance within normal limits. ? ?Right Kidney: Length: 13.3 cm. Echogenicity within normal limits. No ?mass or hydronephrosis visualized. ? ?Left Kidney: Length: 11.7 cm. Echogenicity within normal limits. No ?mass or hydronephrosis visualized. ? ?Abdominal aorta: No aneurysm visualized. ? ?Other findings: None. ? ?IMPRESSION: ?Cholelithiasis.  No sonographic evidence of acute cholecystitis. ? ?Hepatic steatosis. ? ?Electronically Signed ?  By:  Rolm Baptise M.D. ?  On: 11/04/2021 18:06 ? ? ? ?Past Medical History:  ?Diagnosis Date  ? Arthritis   ? Per [facility] New Patient Packet   ? Bilateral malignant neoplasm of breast in female [facility]) 10/18/2018  ? Breast cancer ([place])   ? Per [facility] New Patient Packet   ? Essential hypertension 10/25/2015  ? High blood pressure   ? Per [facility] New Patient Packet   ? High cholesterol   ? Per [facility] New Patient Packet   ? Hyperlipidemia LDL goal <130 10/25/2015  ? Prediabetes 11/19/2017  ? Shingles   ? ?Past Surgical History:  ?Procedure Laterality Date  ? CATARACT EXTRACTION  2018  ? Per [facility] New Patient Packet, Dr.Todd Einar Gip   ? CESAREAN SECTION    ? x2  ? COLONOSCOPY  2018  ? Per [facility] new patient packet, Dr.Locker  ? MASTECTOMY Bilateral 02/02/2019  ? Per [facility] New Patient Packet, DrMichele Blackwood and Dr.Colon   ? MENISCECTOMY Right 2009  ? MENISCUS REPAIR  2004  ? Per [facility] New Patient Packet, Dr. Milagros Reap  ? TONSILLECTOMY  1951  ? Per [facility] New Patient Packet  ? ?Family History  ?Problem Relation Age of Onset  ? Alzheimer's disease Mother   ? Colon cancer Mother 24  ? Ulcers Father   ? Parkinsonism Father   ? Osteoporosis Sister   ? ?Social History  ? ?Tobacco Use  ? Smoking status: Former  ?  Years: 25.00  ?  Types: Cigarettes  ?  Quit date: 1995  ?  Years since quitting: 28.2  ? Smokeless tobacco: Never  ?Vaping Use  ? Vaping Use: Never used  ?Substance Use Topics  ? Alcohol use: Not Currently  ?  Comment: 2 a night of wine  ? Drug use: Not Currently  ? ?Current Outpatient Medications  ?Medication Sig Dispense Refill  ? Azelastine-Fluticasone 137-50 MCG/ACT SUSP SPRAY 1 SPRAY INTO EACH NOSTRIL DAILY 23 g 2  ? BIOTIN 5000 PO Take 1 capsule by mouth daily.    ? candesartan-hydrochlorothiazide (ATACAND HCT) 32-12.5 MG tablet Take 1 tablet by mouth daily. 90 tablet 3  ? Cholecalciferol (VITAMIN D3) 50 MCG (2000 UT) capsule Take 2,000 Units by mouth daily.    ? Cyanocobalamin (VITAMIN B-12) 2500 MCG SUBL     ? esomeprazole (NEXIUM) 20 MG  capsule Take 20 mg by mouth daily at 12 noon.    ? Polyethyl Glycol-Propyl Glycol (SYSTANE) 0.4-0.3 % SOLN Apply to eye daily.    ? psyllium (METAMUCIL) 58.6 % packet Take 1 packet by mouth daily.    ? rosuvastatin (CRESTOR) 20 MG tablet Take 1 tablet (20 mg total) by mouth daily. 90 tablet 3  ? Vaginal Lubricant (REPLENS VA) Place vaginally as needed.    ? vitamin C (ASCORBIC ACID) 500 MG tablet Take 500 mg by mouth daily.    ? ?  No current facility-administered medications for this visit.  ? ?No Known Allergies ? ? ?Review of Systems: ?Positive for arthritis, cough, back pain, night sweats. All other systems reviewed and negative except where noted in HPI.  ? ?Physical Exam  ? ?Wt Readings from Last 3 Encounters:  ?12/01/21 182 lb 6.4 oz (82.7 kg)  ?11/13/21 178 lb (80.7 kg)  ?11/10/21 181 lb 6.4 oz (82.3 kg)  ? ? ?BP 128/70   Pulse 78   Ht '5\' 6"'$  (1.676 m)   Wt 182 lb 6.4 oz (82.7 kg)   SpO2 98%   BMI 29.44 kg/m?  ?Constitutional:  Generally well appearing female in no acute distress. ?Psychiatric: Very  pleasant. Normal mood and affect. Behavior is normal. ?EENT: Pupils normal.  Conjunctivae are normal. No scleral icterus. ?Neck supple.  ?Cardiovascular: Normal rate, regular rhythm. No edema ?Pulmonary/chest: Effort normal and breath sounds normal. No wheezing, rales or rhonchi. ?Abdominal: Soft, nondistended, nontender. Bowel sounds active throughout. There are no masses palpable. No hepatomegaly. ?Neurological: Alert and oriented to person place and time. ?Skin: Skin is warm and dry. No rashes noted. ? ?Tye Savoy, NP  12/01/2021, 3:00 PM ? ?Cc:  ?Referring Provider ?Marlowe Sax, NP ? ? ? ? ? ? ? ?

## 2021-12-01 NOTE — Patient Instructions (Signed)
Please proceed to the basement level for lab work before leaving today. Press "B" on the elevator. The lab is located at the first door on the left as you exit the elevator. ? ?HEALTHCARE LAWS AND MY CHART RESULTS:  ? ?Due to recent changes in healthcare laws, you may see results of your imaging and/or laboratory studies on MyChart before I have had a chance to review them.  I understand that in some cases there may be results that are confusing or concerning to you. Please understand that not all results are received at the same time and often I may need to interpret multiple results in order to provide you with the best plan of care or course of treatment. Therefore, I ask that you please give me 48 hours to thoroughly review all your results before contacting my office for clarification.  ? ?We have scheduled you a follow up with Dr. Tarri Glenn on 02/20/22 at 2:30pm. ? ?No alcohol use. ? ?Thank you for trusting me with your gastrointestinal care!   ? ?Tye Savoy, NP ? ? ?BMI: ? ?If you are age 4 or older, your body mass index should be between 23-30. Your Body mass index is 29.44 kg/m?Marland Kitchen If this is out of the aforementioned range listed, please consider follow up with your Primary Care Provider. ? ?If you are age 74 or younger, your body mass index should be between 19-25. Your Body mass index is 29.44 kg/m?Marland Kitchen If this is out of the aformentioned range listed, please consider follow up with your Primary Care Provider.  ? ?MY CHART: ? ?The [place] [facility] providers would like to encourage you to use [facility] to communicate with providers for non-urgent requests or questions.  Due to long hold times on the telephone, sending your provider a message by [facility] may be a faster and more efficient way to get a response.  Please allow 48 business hours for a response.  Please remember that this is for non-urgent requests.  ? ?

## 2021-12-02 ENCOUNTER — Encounter: Payer: Self-pay | Admitting: Nurse Practitioner

## 2021-12-03 ENCOUNTER — Other Ambulatory Visit: Payer: Self-pay

## 2021-12-03 DIAGNOSIS — C50812 Malignant neoplasm of overlapping sites of left female breast: Secondary | ICD-10-CM

## 2021-12-04 ENCOUNTER — Other Ambulatory Visit: Payer: Self-pay

## 2021-12-04 ENCOUNTER — Encounter: Payer: Self-pay | Admitting: Hematology

## 2021-12-04 ENCOUNTER — Telehealth: Payer: Self-pay | Admitting: Hematology

## 2021-12-04 ENCOUNTER — Inpatient Hospital Stay (HOSPITAL_BASED_OUTPATIENT_CLINIC_OR_DEPARTMENT_OTHER): Payer: BLUE CROSS/BLUE SHIELD | Admitting: Hematology

## 2021-12-04 ENCOUNTER — Inpatient Hospital Stay: Payer: BLUE CROSS/BLUE SHIELD | Attending: Hematology

## 2021-12-04 VITALS — BP 154/88 | HR 87 | Temp 98.4°F | Resp 18 | Ht 66.0 in | Wt 179.1 lb

## 2021-12-04 DIAGNOSIS — R232 Flushing: Secondary | ICD-10-CM | POA: Diagnosis not present

## 2021-12-04 DIAGNOSIS — Z170 Estrogen receptor positive status [ER+]: Secondary | ICD-10-CM | POA: Diagnosis not present

## 2021-12-04 DIAGNOSIS — Z79810 Long term (current) use of selective estrogen receptor modulators (SERMs): Secondary | ICD-10-CM | POA: Diagnosis not present

## 2021-12-04 DIAGNOSIS — R748 Abnormal levels of other serum enzymes: Secondary | ICD-10-CM | POA: Diagnosis not present

## 2021-12-04 DIAGNOSIS — E7800 Pure hypercholesterolemia, unspecified: Secondary | ICD-10-CM | POA: Diagnosis not present

## 2021-12-04 DIAGNOSIS — E785 Hyperlipidemia, unspecified: Secondary | ICD-10-CM | POA: Diagnosis not present

## 2021-12-04 DIAGNOSIS — I10 Essential (primary) hypertension: Secondary | ICD-10-CM | POA: Insufficient documentation

## 2021-12-04 DIAGNOSIS — M1711 Unilateral primary osteoarthritis, right knee: Secondary | ICD-10-CM | POA: Insufficient documentation

## 2021-12-04 DIAGNOSIS — Z79899 Other long term (current) drug therapy: Secondary | ICD-10-CM | POA: Insufficient documentation

## 2021-12-04 DIAGNOSIS — C50812 Malignant neoplasm of overlapping sites of left female breast: Secondary | ICD-10-CM | POA: Diagnosis not present

## 2021-12-04 DIAGNOSIS — Z9013 Acquired absence of bilateral breasts and nipples: Secondary | ICD-10-CM | POA: Diagnosis not present

## 2021-12-04 DIAGNOSIS — M2550 Pain in unspecified joint: Secondary | ICD-10-CM | POA: Diagnosis not present

## 2021-12-04 LAB — CBC WITH DIFFERENTIAL (CANCER CENTER ONLY)
Abs Immature Granulocytes: 0.01 10*3/uL (ref 0.00–0.07)
Basophils Absolute: 0.1 10*3/uL (ref 0.0–0.1)
Basophils Relative: 1 %
Eosinophils Absolute: 0.1 10*3/uL (ref 0.0–0.5)
Eosinophils Relative: 2 %
HCT: 38 % (ref 36.0–46.0)
Hemoglobin: 13 g/dL (ref 12.0–15.0)
Immature Granulocytes: 0 %
Lymphocytes Relative: 32 %
Lymphs Abs: 2.2 10*3/uL (ref 0.7–4.0)
MCH: 32.2 pg (ref 26.0–34.0)
MCHC: 34.2 g/dL (ref 30.0–36.0)
MCV: 94.1 fL (ref 80.0–100.0)
Monocytes Absolute: 0.4 10*3/uL (ref 0.1–1.0)
Monocytes Relative: 6 %
Neutro Abs: 4 10*3/uL (ref 1.7–7.7)
Neutrophils Relative %: 59 %
Platelet Count: 201 10*3/uL (ref 150–400)
RBC: 4.04 MIL/uL (ref 3.87–5.11)
RDW: 13.4 % (ref 11.5–15.5)
WBC Count: 6.9 10*3/uL (ref 4.0–10.5)
nRBC: 0 % (ref 0.0–0.2)

## 2021-12-04 LAB — CMP (CANCER CENTER ONLY)
ALT: 59 U/L — ABNORMAL HIGH (ref 0–44)
AST: 69 U/L — ABNORMAL HIGH (ref 15–41)
Albumin: 4.5 g/dL (ref 3.5–5.0)
Alkaline Phosphatase: 64 U/L (ref 38–126)
Anion gap: 10 (ref 5–15)
BUN: 19 mg/dL (ref 8–23)
CO2: 24 mmol/L (ref 22–32)
Calcium: 9.7 mg/dL (ref 8.9–10.3)
Chloride: 104 mmol/L (ref 98–111)
Creatinine: 0.67 mg/dL (ref 0.44–1.00)
GFR, Estimated: >60 mL/min (ref >60)
Glucose, Bld: 123 mg/dL — ABNORMAL HIGH (ref 70–99)
Potassium: 3.9 mmol/L (ref 3.5–5.1)
Sodium: 138 mmol/L (ref 135–145)
Total Bilirubin: 0.6 mg/dL (ref 0.3–1.2)
Total Protein: 7.6 g/dL (ref 6.5–8.1)

## 2021-12-04 NOTE — Progress Notes (Signed)
Reviewed and agree with management plans. Labs and recent ultrasound reviewed. I recommend NASH fibroSURE and elastography for stating given her comorbidities and particularly her prediabetes.  ? ?Arville Postlewaite L. Tarri Glenn, MD, MPH  ?

## 2021-12-04 NOTE — Progress Notes (Signed)
?[place]   ?[phone] [phone]   ?Clinic Follow up Note  ? ?Patient Care Team: ?[name], MD as PCP - General (Internal Medicine) ? ?Date of Service:  12/04/2021 ? ?CHIEF COMPLAINT: f/u of left breast cancer ? ?CURRENT THERAPY:  ?Antiestrogen therapy, started 02/2019, currently on tamoxifen since 05/2020 ? ?ASSESSMENT & PLAN:  ?Victoria Wolfe is a 76 y.o. post-menopausal female with  ? ?1. Left multifocal breast Cancer, stage IA (mpT1bN0), ER+/PR+/HER2- ?-She was diagnosed with stage 1 breast cancer and treated in [place]. She was treated with neoadjuvant anastrozole for 6-8 weeks, b/l mastectomy and reconstruction with implants.  ?-She has been on adjuvant AI since 02/2019. She has tried all four standard antiestrogen therapy options but did not tolerate due to joint pain and hot flashes. She would prefer to discontinue. ?-She is clinically doing well. Lab reviewed, CBC is WNL, CMP shows elevated liver enzymes. (She has undergone work up with GI for this.) Her physical exam was unremarkable. There is no clinical concern for recurrence. ?-Continue surveillance. Given b/l mastectomy, she does not need mammograms. We reviewed symptoms of metastatic disease to watch for, including persistent bone pain. ?-F/u in 1 year ?  ?2. Comorbidities: HTN, Arthritis with right knee pain, HLD.  ?-On Crestor, Candesartan/HCTZ. Managed by PCP and Ortho ?-Arthritis exacerbated on antiestrogens, now off. ?  ?3. Bone Health, Cancer Screenings ?-Her 09/2019 DEXA was normal (-0.4 T-score). Continue to monitor her DEXA every 2-3 years with PCP ?-last colonoscopy in 02/2018, recall 02/2023 per GI ?-I recommend she continue Pap Smears with her Gyn.  ?  ?  ?PLAN:  ?-Continue surveillance ?-Lab and f/u with NP Lacie in 1 year  ? ? ?No problem-specific Assessment & Plan notes found for this encounter. ? ? ?SUMMARY OF ONCOLOGIC HISTORY: ?Oncology History Overview Note  ?Cancer Staging ?Cancer of  overlapping sites of left breast [facility]) ?Staging form: Breast, AJCC 8th Edition ?- Clinical stage from 09/27/2018: Stage IA (cT1b, cN0, cM0, G2, ER+, PR+, HER2-) - Signed by Truitt Merle, MD on 12/07/2019 ?- Pathologic stage from 02/02/2019: Stage IA (pT1b, pN0, cM0, G2, ER+, PR+, HER2-) - Signed by Truitt Merle, MD on 12/07/2019 ? ? ?  ?Cancer of overlapping sites of left breast [facility])  ?09/27/2018 Cancer Staging  ? Staging form: Breast, AJCC 8th Edition ?- Clinical stage from 09/27/2018: Stage IA (cT1b, cN0, cM0, G2, ER+, PR+, HER2-) - Signed by Truitt Merle, MD on 12/07/2019 ? ?  ?10/04/2018 Mammogram  ? 10/04/18 B/l mammogram - multifocal malignancy left breast with one prominent axillary node. Lesions are seperated by at least 3cm, but additional disease is not excluded indeterminate right enhancement-additional spot mammographic imaging and [place] advised to locate for sampling. Birads 6. ?  ?10/07/2018 Mammogram  ? 10/07/18 - mammogram suspicious of malignancy. the nodule in the right breast middle depth lateral region seen on the craniocausal view only is at alow suspicion for malignancy. a stereotactic biopsy is reocmnended. Birads 4a low suspicions for malignancy  ?  ?10/07/2018 Breast US  ? 10/07/18 B/L [place] - known biopsy proven malignancy. The mass in the lift breast at 11: 00 positoin psterior depth is a known biopsy positive for malignancy. Birads 6 known malignancy  ?  ?10/18/2018 Initial Diagnosis  ? Bilateral malignant neoplasm of breast in female [facility]) ?  ?10/19/2018 Initial Biopsy  ? 09/27/2018 Left breast biopsy  ?-11:00. 5cm from nipple: invasive ductal carcinoma, ER96%+, PR 98%+, HER2-, Ki67 25% ?-Left breast, upper  lesion: invasive ductal carcinoma, ER96%+, PR 98%+, HER2-, Ki67 20% ?10/19/18 Right breast core biopsy ?-Bening fibroadipose breast tissue with focal usual ductal hyperplasia.  ? ?  ?2020 Genetic Testing  ? Genetc testing negative  ?  ?11/2018 - 01/2019 Neo-Adjuvant Anti-estrogen oral therapy  ? Neoadjuvant Anastrozole  once daily for 6-8 weeks and had great response to treatment. Did not tolerate well.  ?  ?02/02/2019 Surgery  ? B/l total mastectomy with Sentinel Node Biopsy by Dr. Pasty Arch 12/08/18 ?PATHOLOGY: ?2 left axillary sentinel lymph node were negative for malignant cells. ?Left breast mastectomy: Invasive and in situ ductal carcinoma, 6m, associated with metal clip and prior biopsy site change, at the 11:00-12:00 position; a second metal clip is present at the 8:00 to 9 o'clock position, no evidence of malignancy ?Right breast mastectomy: Benign breast tissue ?  ?02/02/2019 Cancer Staging  ? Staging form: Breast, AJCC 8th Edition ?- Pathologic stage from 02/02/2019: Stage IA (pT1b, pN0, cM0, G2, ER+, PR+, HER2-) - Signed by FTruitt Merle MD on 12/07/2019 ? ?  ?02/2019 -  Anti-estrogen oral therapy  ? Letrozole once daily for 2 months since the end of 02/2019-04/2019. Did not tolerate well and was switched to Exemestane once daily in 04/2019. Switched to Tamoxifen in 05/2020 due to worsened joint pain.  ?  ?06/30/2019 Surgery  ? B/l Breast Reconstruction with Dr. CGreig Castilla11/6/20 ?  ?10/17/2019 Imaging  ? DEXA ?ASSESSMENT: ?The BMD measured at Femur Neck Left is 0.982 g/cm2 with a T-score of ?-0.4. This patient is considered normal according to [place]Organization (WHO) criteria. ?  ? ? ? ?INTERVAL HISTORY:  ?JTalley Cascois here for a follow up of breast cancer. She was last seen by me on 05/29/21. She presents to the clinic alone. ?She reports she has had a rough last few weeks-- vaginal fungal infection, then a "pretty severe" URI, and then shingles. She notes she is finally starting to recover. ?She reports her hot flashes and joint pain have improved off tamoxifen. ?  ?All other systems were reviewed with the patient and are negative. ? ?MEDICAL HISTORY:  ?Past Medical History:  ?Diagnosis Date  ? [facility]thritis   ? [facility]   ? Bilateral malignant neoplasm of breast in female ([facility] 10/18/2018  ? Breast cancer  ([place]   ? [facility]   ? Essential hypertension 10/25/2015  ? High blood pressure   ? [facility]   ? High cholesterol   ? [facility]   ? Hyperlipidemia LDL goal <130 10/25/2015  ? Prediabetes 11/19/2017  ? Shingles   ? ? ?SURGICAL HISTORY: ?Past Surgical History:  ?Procedure Laterality Date  ? [facility]  2018  ? [facility] Dr.Todd LEinar Gip  ? CESAREAN SECTION    ? x2  ? [place]  2018  ? [place]  ? MASTECTOMY Bilateral 02/02/2019  ? [facility], DrMichele Blackwood and Dr.Colon   ? MENISCECTOMY Right 2009  ? MENISCUS REPAIR  2004  ? [facility] Dr. DMilagros Reap ? [facility]ONS[facility]LECTOMY  1951  ? [facility]  ? ? ?I have reviewed the social history and family history with the patient and they are unchanged from previous note. ? ?ALLERGIES:  has No Known Allergies. ? ?MEDICATIONS:  ?Current Outpatient Medications  ?Medication Sig Dispense Refill  ? Azelastine-Fluticasone 137-50 MCG/ACT SUSP SPRAY 1  SPRAY INTO EACH NOSTRIL DAILY 23 g 2  ? BIOTIN 5000 PO Take 1 capsule by mouth daily.    ? candesartan-hydrochlorothiazide (ATACAND HCT) 32-12.5 MG tablet Take 1 tablet by mouth daily. 90 tablet 3  ? Cholecalciferol (VITAMIN D3) 50 MCG (2000 UT) capsule Take 2,000 Units by mouth daily.    ? Cyanocobalamin (VITAMIN B-12) 2500 MCG SUBL     ? esomeprazole (NEXIUM) 20 MG capsule Take 20 mg by mouth daily at 12 noon.    ? Polyethyl Glycol-Propyl Glycol (SYSTANE) 0.4-0.3 % SOLN Apply to eye daily.    ? psyllium (METAMUCIL) 58.6 % packet Take 1 packet by mouth daily.    ? rosuvastatin (CRESTOR) 20 MG tablet Take 1 tablet (20 mg total) by mouth daily. 90 tablet 3  ? Vaginal Lubricant (REPLENS VA) Place vaginally as needed.    ? vitamin C (ASCORBIC ACID) 500 MG tablet Take 500 mg by mouth daily.    ? ?No current facility-administered medications for this visit.  ? ? ?PHYSICAL EXAMINATION: ?ECOG  PERFORMANCE STATUS: 0 - Asymptomatic ? ?Vitals:  ? 12/04/21 1300  ?BP: (!) 154/88  ?Pulse: 87  ?Resp: 18  ?Temp: 98.4 ?F (36.9 ?C)  ?SpO2: 97%  ? ?Wt Readings from Last 3 Encounters:  ?12/04/21 179 lb 1.6 oz

## 2021-12-04 NOTE — Telephone Encounter (Signed)
Left message with follow-up appointment per 4/13 los. ?

## 2021-12-05 ENCOUNTER — Other Ambulatory Visit: Payer: Self-pay

## 2021-12-05 ENCOUNTER — Telehealth: Payer: Self-pay

## 2021-12-05 DIAGNOSIS — E8889 Other specified metabolic disorders: Secondary | ICD-10-CM

## 2021-12-05 DIAGNOSIS — E669 Obesity, unspecified: Secondary | ICD-10-CM

## 2021-12-05 DIAGNOSIS — R7303 Prediabetes: Secondary | ICD-10-CM

## 2021-12-05 NOTE — Telephone Encounter (Signed)
Spoke with pt. Gave pt message. Orders placed for fibroSURE and elastography. Secure staff message sent to schedulers. Pt verbalized understanding and had no other concerns at end of call.   ?

## 2021-12-05 NOTE — Telephone Encounter (Signed)
-----   Message from Willia Craze, NP sent at 12/04/2021  7:45 PM EDT ----- ?Mickel Baas, please let her know that I reviewed her case with Dr. Tarri Glenn. Patient is suppose to follow up with Dr. Tarri Glenn in about three months but she recommends  a fibroSURE and elastography in the meantime to assess for the presence of chronic liver disease. When you schedule this two tests the Dx code can be steatosis, obesity, pre-diabetes.  ? ?Thanks ? ?

## 2021-12-06 ENCOUNTER — Encounter: Payer: Self-pay | Admitting: Gastroenterology

## 2021-12-06 LAB — ANTI-NUCLEAR AB-TITER (ANA TITER): ANA Titer 1: 1:40 {titer} — ABNORMAL HIGH

## 2021-12-06 LAB — HEPATITIS A ANTIBODY, TOTAL: Hepatitis A AB,Total: REACTIVE — AB

## 2021-12-06 LAB — HEPATITIS B SURFACE ANTIBODY,QUALITATIVE: Hep B S Ab: NONREACTIVE

## 2021-12-06 LAB — HEPATITIS C ANTIBODY
Hepatitis C Ab: NONREACTIVE
SIGNAL TO CUT-OFF: <0.02 (ref <1.00)

## 2021-12-06 LAB — MITOCHONDRIAL ANTIBODIES: Mitochondrial M2 Ab, IgG: <=20 U (ref <=20.0)

## 2021-12-06 LAB — ANA: Anti Nuclear Antibody (ANA): POSITIVE — AB

## 2021-12-06 LAB — HEPATITIS B SURFACE ANTIGEN: Hepatitis B Surface Ag: NONREACTIVE

## 2021-12-06 LAB — ANTI-SMOOTH MUSCLE ANTIBODY, IGG: Actin (Smooth Muscle) Antibody (IGG): <20 U (ref <20)

## 2021-12-06 LAB — TISSUE TRANSGLUTAMINASE ABS,IGG,IGA
(tTG) Ab, IgA: <1 U/mL
(tTG) Ab, IgG: <1 U/mL

## 2021-12-06 LAB — IGA: Immunoglobulin A: 184 mg/dL (ref 70–320)

## 2021-12-10 ENCOUNTER — Other Ambulatory Visit: Payer: BLUE CROSS/BLUE SHIELD

## 2021-12-10 DIAGNOSIS — R7303 Prediabetes: Secondary | ICD-10-CM

## 2021-12-10 DIAGNOSIS — E8889 Other specified metabolic disorders: Secondary | ICD-10-CM | POA: Diagnosis not present

## 2021-12-10 DIAGNOSIS — E669 Obesity, unspecified: Secondary | ICD-10-CM | POA: Diagnosis not present

## 2021-12-11 DIAGNOSIS — I10 Essential (primary) hypertension: Secondary | ICD-10-CM | POA: Diagnosis not present

## 2021-12-11 DIAGNOSIS — E785 Hyperlipidemia, unspecified: Secondary | ICD-10-CM | POA: Diagnosis not present

## 2021-12-11 DIAGNOSIS — E7800 Pure hypercholesterolemia, unspecified: Secondary | ICD-10-CM | POA: Diagnosis not present

## 2021-12-11 DIAGNOSIS — E0800 Diabetes mellitus due to underlying condition with hyperosmolarity without nonketotic hyperglycemic-hyperosmolar coma (NKHHC): Secondary | ICD-10-CM | POA: Diagnosis not present

## 2021-12-11 LAB — CBC AND DIFFERENTIAL
HCT: 41 (ref 36–46)
Hemoglobin: 13.8 (ref 12.0–16.0)
Platelets: 211 10*3/uL (ref 150–400)
WBC: 5.2

## 2021-12-11 LAB — BASIC METABOLIC PANEL
BUN: 18 (ref 4–21)
CO2: 24 — AB (ref 13–22)
Chloride: 100 (ref 99–108)
Creatinine: 0.6 (ref 0.5–1.1)
Glucose: 111
Potassium: 4 mEq/L (ref 3.5–5.1)
Sodium: 141 (ref 137–147)

## 2021-12-11 LAB — COMPREHENSIVE METABOLIC PANEL
Albumin: 4.8 (ref 3.5–5.0)
Calcium: 10.4 (ref 8.7–10.7)
Globulin: 2.7

## 2021-12-11 LAB — HEMOGLOBIN A1C: Hemoglobin A1C: 6.8

## 2021-12-11 LAB — HEPATIC FUNCTION PANEL
ALT: 49 U/L — AB (ref 7–35)
AST: 37 — AB (ref 13–35)
Alkaline Phosphatase: 80 (ref 25–125)
Bilirubin, Total: 0.5

## 2021-12-11 LAB — CBC: RBC: 4.16 (ref 3.87–5.11)

## 2021-12-12 DIAGNOSIS — M545 Low back pain, unspecified: Secondary | ICD-10-CM | POA: Diagnosis not present

## 2021-12-12 DIAGNOSIS — M5451 Vertebrogenic low back pain: Secondary | ICD-10-CM | POA: Diagnosis not present

## 2021-12-12 LAB — NASH FIBROSURE
ALPHA 2-MACROGLOBULINS, QN: 314 mg/dL — ABNORMAL HIGH (ref 110–276)
ALT (SGPT) P5P: 62 IU/L — ABNORMAL HIGH (ref 0–40)
AST (SGOT) P5P: 48 IU/L — ABNORMAL HIGH (ref 0–40)
Apolipoprotein A-1: 157 mg/dL (ref 116–209)
Bilirubin, Total: 0.5 mg/dL (ref 0.0–1.2)
Cholesterol, Total: 165 mg/dL (ref 100–199)
Fibrosis Score: 0.51 — ABNORMAL HIGH (ref 0.00–0.21)
GGT: 53 IU/L (ref 0–60)
Glucose: 107 mg/dL — ABNORMAL HIGH (ref 70–99)
Haptoglobin: 164 mg/dL (ref 42–346)
Height: 66 in
NASH Score: 0.75 — ABNORMAL HIGH
Steatosis Score: 0.64 — ABNORMAL HIGH (ref 0.00–0.30)
Triglycerides: 103 mg/dL (ref 0–149)
Weight: 179 [lb_av]

## 2021-12-15 ENCOUNTER — Ambulatory Visit (HOSPITAL_BASED_OUTPATIENT_CLINIC_OR_DEPARTMENT_OTHER)
Admission: RE | Admit: 2021-12-15 | Discharge: 2021-12-15 | Disposition: A | Discharge status: Home / Self Care | Payer: BLUE CROSS/BLUE SHIELD | Source: Ambulatory Visit | Attending: Nurse Practitioner | Admitting: Nurse Practitioner

## 2021-12-15 DIAGNOSIS — R7303 Prediabetes: Secondary | ICD-10-CM | POA: Diagnosis not present

## 2021-12-15 DIAGNOSIS — E669 Obesity, unspecified: Secondary | ICD-10-CM | POA: Diagnosis not present

## 2021-12-15 DIAGNOSIS — E8889 Other specified metabolic disorders: Secondary | ICD-10-CM | POA: Diagnosis not present

## 2021-12-15 DIAGNOSIS — K8020 Calculus of gallbladder without cholecystitis without obstruction: Secondary | ICD-10-CM | POA: Diagnosis not present

## 2021-12-17 ENCOUNTER — Encounter: Payer: Self-pay | Admitting: Internal Medicine

## 2021-12-17 ENCOUNTER — Non-Acute Institutional Stay: Payer: BLUE CROSS/BLUE SHIELD | Admitting: Internal Medicine

## 2021-12-17 VITALS — BP 122/92 | HR 64 | Temp 97.9°F | Ht 66.0 in | Wt 174.0 lb

## 2021-12-17 DIAGNOSIS — R350 Frequency of micturition: Secondary | ICD-10-CM

## 2021-12-17 DIAGNOSIS — M549 Dorsalgia, unspecified: Secondary | ICD-10-CM

## 2021-12-17 DIAGNOSIS — E785 Hyperlipidemia, unspecified: Secondary | ICD-10-CM | POA: Diagnosis not present

## 2021-12-17 DIAGNOSIS — E119 Type 2 diabetes mellitus without complications: Secondary | ICD-10-CM | POA: Diagnosis not present

## 2021-12-17 DIAGNOSIS — R7989 Other specified abnormal findings of blood chemistry: Secondary | ICD-10-CM | POA: Diagnosis not present

## 2021-12-17 DIAGNOSIS — I10 Essential (primary) hypertension: Secondary | ICD-10-CM

## 2021-12-17 DIAGNOSIS — E559 Vitamin D deficiency, unspecified: Secondary | ICD-10-CM

## 2021-12-17 DIAGNOSIS — E538 Deficiency of other specified B group vitamins: Secondary | ICD-10-CM

## 2021-12-17 NOTE — Progress Notes (Signed)
? ?Location:  [place] ?  ?Place of Service:  Clinic (12) ? ?Provider:  ? ?Code Status:  ?Goals of Care:  ? ?  12/17/2021  ?  9:05 AM  ?Advanced Directives  ?Does Patient Have a Medical Advance Directive? Yes  ?Type of Paramedic of [place];Living will  ?Does patient want to make changes to medical advance directive? No - Patient declined  ?Copy of [place] in Chart? Yes - validated most recent copy scanned in chart (See row information)  ? ? ? ?Chief Complaint  ?Patient presents with  ? Medical Management of Chronic Issues  ?  Patient returns to the clinic for her 3 month follow up. She would like to discuss her recent procedures and plans. She will be going for back MRI on Friday. Patient is temporarily on prednisone and an antiinflammatory.   ? ? ?HPI: Patient is a 76 y.o. female seen today for medical management of chronic diseases.   ? ?Patient has had number of issues on Past few months ?08/04/2021 Treated for Acute Cystitis ?Yeast Infection in 09/19/21 with Diflucan ?Acute Bronchitis Treated with Prednisone and Antibiotics in 02/23 ?Zoster Rash in 03/23 ? ?Elevated LFTS ?Seen By GI ?US showed Cholelithiasis and Hepatic Steatosis ?Does have h/o Excessive drinking in [place]. Also takes Tylenol  ?Follow up with GI ?Has Quit drinking  ?LFTS have improved ? ?Urinary In continence Mostly at night  ?Lower Back Pain ?On Prednisone per Ortho. Also Plan for MRI on Fri as she has h/o Breast cancer ? ?Bilateral mastectomy for breast cancer ?Not on tamoxifen any more due to side effects ?Follows with Dr Annamaria Boots ? ?Other stable issues  ?Hypertension, prediabetes and diet-controlled, HLD, B12 deficiency ?  ?Otherwise very active.  Walks with no assist.  No falls ?2 daughters in [place] who are POA ?Past Medical History:  ?Diagnosis Date  ? Arthritis   ? [facility]   ? Bilateral malignant neoplasm of breast in female [facility]) 10/18/2018  ? Breast  cancer ([facility])   ? Per [facility] New Patient Packet   ? Essential hypertension 10/25/2015  ? High blood pressure   ? Per [facility] New Patient Packet   ? High cholesterol   ? Per [facility] New Patient Packet   ? Hyperlipidemia LDL goal <130 10/25/2015  ? Prediabetes 11/19/2017  ? Shingles   ? ? ?Past Surgical History:  ?Procedure Laterality Date  ? CATARACT EXTRACTION  2018  ? Per [facility] New Patient Packet, Dr.Todd Einar Gip   ? CESAREAN SECTION    ? x2  ? COLONOSCOPY  2018  ? Per [place] new patient packet, Dr.Locker  ? MASTECTOMY Bilateral 02/02/2019  ? Per [facility] New Patient Packet, DrMichele Blackwood and Dr.Colon   ? MENISCECTOMY Right 2009  ? MENISCUS REPAIR  2004  ? Per [facility] New Patient Packet, Dr. Milagros Reap  ? TONSILLECTOMY  1951  ? Per [facility] New Patient Packet  ? ? ?No Known Allergies ? ?Outpatient Encounter Medications as of 12/17/2021  ?Medication Sig  ? BIOTIN 5000 PO Take 1 capsule by mouth daily.  ? candesartan-hydrochlorothiazide (ATACAND HCT) 32-12.5 MG tablet Take 1 tablet by mouth daily.  ? Cholecalciferol (VITAMIN D3) 50 MCG (2000 UT) capsule Take 2,000 Units by mouth daily.  ? Cyanocobalamin (VITAMIN B-12) 2500 MCG SUBL   ? esomeprazole (NEXIUM) 20 MG capsule Take 20 mg by mouth daily at 12 noon.  ? Polyethyl Glycol-Propyl Glycol (SYSTANE) 0.4-0.3 % SOLN Apply to  eye daily.  ? psyllium (METAMUCIL) 58.6 % packet Take 1 packet by mouth daily.  ? rosuvastatin (CRESTOR) 20 MG tablet Take 1 tablet (20 mg total) by mouth daily.  ? Vaginal Lubricant (REPLENS VA) Place vaginally as needed.  ? vitamin C (ASCORBIC ACID) 500 MG tablet Take 500 mg by mouth daily.  ? Azelastine-Fluticasone 137-50 MCG/ACT SUSP SPRAY 1 SPRAY INTO EACH NOSTRIL DAILY (Patient not taking: Reported on 12/17/2021)  ? ?No facility-administered encounter medications on file as of 12/17/2021.  ? ? ?Review of Systems:  ?Review of Systems  ?Constitutional:  Negative for activity change and appetite change.  ?HENT: Negative.    ?Respiratory:  Negative for cough and shortness  of breath.   ?Cardiovascular:  Negative for leg swelling.  ?Gastrointestinal:  Negative for constipation.  ?Genitourinary:  Positive for frequency.  ?Musculoskeletal:  Positive for back pain. Negative for arthralgias, gait problem and myalgias.  ?Skin: Negative.   ?Neurological:  Negative for dizziness and weakness.  ?Psychiatric/Behavioral:  Negative for confusion, dysphoric mood and sleep disturbance.   ? ?Health Maintenance  ?Topic Date Due  ? FOOT EXAM  Never done  ? OPHTHALMOLOGY EXAM  Never done  ? COLONOSCOPY (Pts 45-78yr Insurance coverage will need to be confirmed)  08/24/2022 (Originally 03/14/2021)  ? INFLUENZA VACCINE  03/24/2022  ? HEMOGLOBIN A1C  06/12/2022  ? TETANUS/TDAP  08/27/2024  ? Pneumonia Vaccine 76 Years old  Completed  ? DEXA SCAN  Completed  ? COVID-19 Vaccine  Completed  ? Hepatitis C Screening  Completed  ? Zoster Vaccines- Shingrix  Completed  ? HPV VACCINES  Aged Out  ? ? ?Physical Exam: ?Vitals:  ? 12/17/21 0854  ?BP: (!) 122/92  ?Pulse: 64  ?Temp: 97.9 ?F (36.6 ?C)  ?SpO2: 97%  ?Weight: 174 lb (78.9 kg)  ?Height: '5\' 6"'$  (1.676 m)  ? ?Body mass index is 28.08 kg/m?.Marland Kitchen?Physical Exam ?Vitals reviewed.  ?Constitutional:   ?   Appearance: Normal appearance.  ?HENT:  ?   Head: Normocephalic.  ?   Nose: Nose normal.  ?   Mouth/Throat:  ?   Mouth: Mucous membranes are moist.  ?   Pharynx: Oropharynx is clear.  ?Eyes:  ?   Pupils: Pupils are equal, round, and reactive to light.  ?Cardiovascular:  ?   Rate and Rhythm: Normal rate and regular rhythm.  ?   Pulses: Normal pulses.  ?   Heart sounds: Normal heart sounds. No murmur heard. ?Pulmonary:  ?   Effort: Pulmonary effort is normal.  ?   Breath sounds: Normal breath sounds.  ?Abdominal:  ?   General: Abdomen is flat. Bowel sounds are normal.  ?   Palpations: Abdomen is soft.  ?Musculoskeletal:     ?   General: No swelling.  ?   Cervical back: Neck supple.  ?Skin: ?   General: Skin is warm.  ?Neurological:  ?   General: No focal deficit  present.  ?   Mental Status: She is alert and oriented to person, place, and time.  ?   Comments: C/o Tightening in her Back  when doing Straight Leg raise. No Point Tenderness ?Walking well  ?Psychiatric:     ?   Mood and Affect: Mood normal.     ?   Thought Content: Thought content normal.  ? ? ?Labs reviewed: ?Basic Metabolic Panel: ?Recent Labs  ?  05/29/21 ?1420 09/02/21 ?0000 11/04/21 ?1446 12/04/21 ?1219 12/11/21 ?0000  ?NA 139   < > 140 138 141  ?  K 4.1   < > 3.9 3.9 4.0  ?CL 105   < > 102 104 100  ?CO2 23   < > 26 24 24*  ?GLUCOSE 135*  --  113* 123*  --   ?BUN 20   < > '14 19 18  '$ ?CREATININE 0.90   < > 0.67 0.67 0.6  ?CALCIUM 9.4   < > 10.0 9.7 10.4  ? < > = values in this interval not displayed.  ? ?Liver Function Tests: ?Recent Labs  ?  05/29/21 ?1420 05/29/21 ?1420 09/02/21 ?0000 11/04/21 ?1446 12/04/21 ?1219 12/11/21 ?0000  ?AST 59*   < > 108* 37* 69* 37*  ?ALT 54*   < > 91* 42* 59* 49*  ?ALKPHOS 68  --  82  --  64 80  ?BILITOT 0.5  --   --  0.6 0.6  --   ?PROT 7.3  --   --  7.4 7.6  --   ?ALBUMIN 4.0  --  4.6  --  4.5 4.8  ? < > = values in this interval not displayed.  ? ?Recent Labs  ?  11/04/21 ?1446  ?LIPASE 22  ? ?No results for input(s): AMMONIA in the last 8760 hours. ?CBC: ?Recent Labs  ?  05/29/21 ?1420 09/02/21 ?0000 11/04/21 ?1446 12/04/21 ?1219 12/11/21 ?0000  ?WBC 8.2   < > 5.6 6.9 5.2  ?NEUTROABS 5.0  --  3,030 4.0  --   ?HGB 12.8   < > 13.8 13.0 13.8  ?HCT 37.0   < > 41.0 38.0 41  ?MCV 94.1  --  96.2 94.1  --   ?PLT 192   < > 209 201 211  ? < > = values in this interval not displayed.  ? ?Lipid Panel: ?Recent Labs  ?  09/02/21 ?0000 12/10/21 ?0958  ?CHOL 172 165  ?HDL 52  --   ?LDLCALC 96  --   ?TRIG 120 103  ? ?Lab Results  ?Component Value Date  ? HGBA1C 6.8 12/11/2021  ? ? ?Procedures since last visit: ?US ABDOMEN COMPLETE W/ELASTOGRAPHY ? ?Result Date: 12/15/2021 ?CLINICAL DATA:  Chronic liver disease. EXAM: ULTRASOUND ABDOMEN ULTRASOUND HEPATIC ELASTOGRAPHY TECHNIQUE: Sonography of  the upper abdomen was performed. In addition, ultrasound elastography evaluation of the liver was performed. A region of interest was placed within the right lobe of the liver. Following application of a compress

## 2021-12-17 NOTE — Addendum Note (Signed)
Addended by: Logan Bores on: 12/17/2021 11:40 AM ? ? Modules accepted: Orders ? ?

## 2021-12-19 DIAGNOSIS — M5416 Radiculopathy, lumbar region: Secondary | ICD-10-CM | POA: Diagnosis not present

## 2021-12-19 NOTE — Telephone Encounter (Signed)
Please route messages back to those involved to avoid delay in patient advice.  ?

## 2022-01-05 DIAGNOSIS — M5451 Vertebrogenic low back pain: Secondary | ICD-10-CM | POA: Diagnosis not present

## 2022-01-07 DIAGNOSIS — D1801 Hemangioma of skin and subcutaneous tissue: Secondary | ICD-10-CM | POA: Diagnosis not present

## 2022-01-07 DIAGNOSIS — L814 Other melanin hyperpigmentation: Secondary | ICD-10-CM | POA: Diagnosis not present

## 2022-01-07 DIAGNOSIS — L570 Actinic keratosis: Secondary | ICD-10-CM | POA: Diagnosis not present

## 2022-01-07 DIAGNOSIS — L821 Other seborrheic keratosis: Secondary | ICD-10-CM | POA: Diagnosis not present

## 2022-01-14 DIAGNOSIS — M5451 Vertebrogenic low back pain: Secondary | ICD-10-CM | POA: Diagnosis not present

## 2022-01-14 DIAGNOSIS — M5126 Other intervertebral disc displacement, lumbar region: Secondary | ICD-10-CM | POA: Diagnosis not present

## 2022-01-14 DIAGNOSIS — M2569 Stiffness of other specified joint, not elsewhere classified: Secondary | ICD-10-CM | POA: Diagnosis not present

## 2022-01-14 DIAGNOSIS — M47896 Other spondylosis, lumbar region: Secondary | ICD-10-CM | POA: Diagnosis not present

## 2022-01-16 DIAGNOSIS — M5126 Other intervertebral disc displacement, lumbar region: Secondary | ICD-10-CM | POA: Diagnosis not present

## 2022-01-16 DIAGNOSIS — M2569 Stiffness of other specified joint, not elsewhere classified: Secondary | ICD-10-CM | POA: Diagnosis not present

## 2022-01-16 DIAGNOSIS — M47896 Other spondylosis, lumbar region: Secondary | ICD-10-CM | POA: Diagnosis not present

## 2022-01-16 DIAGNOSIS — M5451 Vertebrogenic low back pain: Secondary | ICD-10-CM | POA: Diagnosis not present

## 2022-01-20 DIAGNOSIS — M5451 Vertebrogenic low back pain: Secondary | ICD-10-CM | POA: Diagnosis not present

## 2022-01-20 DIAGNOSIS — M2569 Stiffness of other specified joint, not elsewhere classified: Secondary | ICD-10-CM | POA: Diagnosis not present

## 2022-01-20 DIAGNOSIS — M47896 Other spondylosis, lumbar region: Secondary | ICD-10-CM | POA: Diagnosis not present

## 2022-01-20 DIAGNOSIS — M5126 Other intervertebral disc displacement, lumbar region: Secondary | ICD-10-CM | POA: Diagnosis not present

## 2022-01-27 DIAGNOSIS — M5451 Vertebrogenic low back pain: Secondary | ICD-10-CM | POA: Diagnosis not present

## 2022-01-27 DIAGNOSIS — M5126 Other intervertebral disc displacement, lumbar region: Secondary | ICD-10-CM | POA: Diagnosis not present

## 2022-01-27 DIAGNOSIS — M47896 Other spondylosis, lumbar region: Secondary | ICD-10-CM | POA: Diagnosis not present

## 2022-01-27 DIAGNOSIS — M2569 Stiffness of other specified joint, not elsewhere classified: Secondary | ICD-10-CM | POA: Diagnosis not present

## 2022-02-05 ENCOUNTER — Other Ambulatory Visit: Payer: Self-pay | Admitting: Family

## 2022-02-05 DIAGNOSIS — R7309 Other abnormal glucose: Secondary | ICD-10-CM

## 2022-02-06 ENCOUNTER — Other Ambulatory Visit: Payer: Medicare Other

## 2022-02-06 DIAGNOSIS — R7309 Other abnormal glucose: Secondary | ICD-10-CM | POA: Diagnosis not present

## 2022-02-07 LAB — HEMOGLOBIN A1C
Hgb A1c MFr Bld: 6.6 % of total Hgb — ABNORMAL HIGH (ref <5.7)
Mean Plasma Glucose: 143 mg/dL
eAG (mmol/L): 7.9 mmol/L

## 2022-02-20 ENCOUNTER — Encounter: Payer: Self-pay | Admitting: Gastroenterology

## 2022-02-20 ENCOUNTER — Ambulatory Visit (INDEPENDENT_AMBULATORY_CARE_PROVIDER_SITE_OTHER): Payer: BLUE CROSS/BLUE SHIELD | Admitting: Gastroenterology

## 2022-02-20 ENCOUNTER — Other Ambulatory Visit: Payer: BLUE CROSS/BLUE SHIELD

## 2022-02-20 VITALS — BP 128/70 | HR 76 | Ht 66.0 in | Wt 177.6 lb

## 2022-02-20 DIAGNOSIS — E8889 Other specified metabolic disorders: Secondary | ICD-10-CM

## 2022-02-20 DIAGNOSIS — R7989 Other specified abnormal findings of blood chemistry: Secondary | ICD-10-CM | POA: Diagnosis not present

## 2022-02-20 DIAGNOSIS — R768 Other specified abnormal immunological findings in serum: Secondary | ICD-10-CM

## 2022-02-20 NOTE — Progress Notes (Signed)
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    - Likely fatty liver but her ANA was mildly positive    - fatty liver by imaging      - No advanced fibrosis by elastography    - FIB4 2.05, APRI 0.5    - [place] showed [id number] disease  Suspected fatty liver with recent HgbA1C of 6.6 History of colon polyps    - Colonoscopy 2016: 2 tubular adenomas including a 28m transverse colon polyp    - Colonoscopy 2019: 5 polyps - only one was tubular adenoma, the others were hyperplastic polyps    - Surveillance recommended in 2024 Asymptomatic cholelithiasis   PLAN: - ANA, smooth muscle antibody, IgG to exclude concurrent autoimmune hepatitis - Hepatitis B vaccine recommended, she has immunity to hepatitis A - Discussed strategies to love the liver including abstinence from alcohol - Recommend restaging every 12-24 months given her concurrent prediabetes - Reviewed long-term risks of cardiovascular disease in the setting of NASH - Maximizing control of blood sugars may ultimately improve her NASH - See patient instructions for additional recommendations - Surveillance colonoscopy due July 2024  HPI: Victoria Strykeris a 76y.o. female who returns in follow-up.  This is my first visit with Victoria Wolfe.  She saw PTye Savoy4/14/2023 for abnormal liver enzymes.  She has a history of HLD, HTN, breast cancer s/p bilateral mastectomies, shingles, cholelithiasis, hepatic steatosis.  She had noted abnormal liver enzymes over the last year generally not more than 3 times upper limit of normal.  They have been attributed to fatty liver seen on ultrasound.   Serologic evaluation included negative labs including labs for celiac disease, hemochromatosis, hepatitis C, and hepatitis B.  She has immunity to hepatitis A.  ANA was  positive.  NBluford Mainshowed [id number].   Abdominal ultrasound with elastography 12/15/2021 was negative for advanced fibrosis with a medium K PA of 2.3.  Imaging of the liver showed a heterogeneous pattern.  Spleen size normal.  Some issues with constipation treated with prunes, fiber, and water. GI ROS is otherwise negative.   She continues to drink some alcohol.  Endoscopic history includes: - [place] revealed a 10 mm ascending colon tubular adenoma and a 12 mm transverse colon tubular adenoma.  Surveillance colonoscopy recommended in 3 years with a due date of 04/2018.  - [facility] [place] revealing 2 polyps at the hepatic flexure, 2 polyps at the splenic flexure, and a polyp in the sigmoid colon.  There was diverticulosis. Pathology results showed one tubular adenoma at the splenic flexure. The other polyps were hyperplastic.     Past Medical History:  Diagnosis Date   [facility]    [facility]    B[facility] malignant [facility] of breast in female ([facility] 10/18/2018   [facility]    [facility]    Essential hypertension 10/25/2015   High blood pressure    [facility] [place] [facility]    [facility]    [facility]    Hyperlipidemia LDL goal <130 10/25/2015   Prediabetes 11/19/2017   Shingles     Past  Surgical History:  Procedure Laterality Date   CATARACT EXTRACTION  2018   Per [place] New Patient Packet, [place]     x2   COLONOSCOPY  2018   Per [place] new patient packet, Dr.Locker   MASTECTOMY Bilateral 02/02/2019   Per [facility] New Patient Packet, DrMichele Blackwood and Dr.Colon    MENISCECTOMY Right 2009   MENISCUS REPAIR  2004   Per [facility] New Patient Packet, Dr. Milagros Reap   TONSILLECTOMY  [phone]   Per [place] Patient Packet     Current Outpatient Medications  Medication Sig Dispense Refill   Azelastine-Fluticasone 137-50 MCG/ACT SUSP SPRAY 1 SPRAY INTO EACH NOSTRIL DAILY (Patient not taking: Reported on 12/17/2021) 23 g 2   BIOTIN 5000 PO Take 1 capsule by mouth daily.     candesartan-hydrochlorothiazide (ATACAND HCT) 32-12.5 MG tablet Take 1 tablet by mouth daily. 90 tablet 3   Cholecalciferol (VITAMIN D3) 50 MCG (2000 UT) capsule Take 2,000 Units by mouth daily.     Cyanocobalamin (VITAMIN B-12) 2500 MCG SUBL      esomeprazole (NEXIUM) 20 MG capsule Take 20 mg by mouth daily at 12 noon.     Polyethyl Glycol-Propyl Glycol (SYSTANE) 0.4-0.3 % SOLN Apply to eye daily.     psyllium (METAMUCIL) 58.6 % packet Take 1 packet by mouth daily.     rosuvastatin (CRESTOR) 20 MG tablet Take 1 tablet (20 mg total) by mouth daily. 90 tablet 3   tiZANidine (ZANAFLEX) 2 MG tablet Take 1 tablet (2 mg total) by mouth 2 (two) times daily. For muscle spasms     Vaginal Lubricant (REPLENS [place]) Place vaginally as needed.     vitamin C (ASCORBIC ACID) 500 MG tablet Take 500 mg by mouth daily.     No current facility-administered medications for this visit.    Allergies as of 02/20/2022   (No Known Allergies)    Family History  Problem Relation Age of Onset   Alzheimer's disease Mother    Colon cancer Mother 49   Ulcers Father    Parkinsonism Father    Osteoporosis Sister      Physical Exam: Vital signs in last 24 hours: General:   Alert, in NAD. No scleral icterus. No bilateral temporal wasting.  Heart:  Regular rate and rhythm; no murmurs Pulm: Clear anteriorly; no wheezing Abdomen:  Soft. Nontender. Nondistended. Normal bowel sounds. No rebound or guarding. No fluid wave.  LAD: No inguinal or umbilical LAD Extremities:  Without edema. Neurologic:  Alert and  oriented x4;  grossly normal neurologically; no asterixis or clonus. Skin: No jaundice. No palmar erythema or spider angioma.  Psych:  Alert and cooperative.  Normal mood and affect.     Khole Arterburn L. Tarri Glenn, MD, MPH 02/20/2022, 2:15 PM

## 2022-02-20 NOTE — Patient Instructions (Addendum)
It was a pleasure to meet you today.   Please stop in the lab today for further testing to exclude autoimmune hepatitis in combination with fatty liver.  Your recent testing suggest mild inflammation and minimal damage of the liver related to fatty liver.  It is important to keep your liver as healthy as possible and avoid anything that can damage your liver. Here are some important things you should do to [place] drink too much alcohol. How much is too much remains controversial, but it's probably best to avoid alcohol completely. - Make sure that none of your medications, herbs, and supplements are toxic to the liver; you can crosscheck your list with [employer] (an online resource).  Even acetaminophen (the generic ingredient in Tylenol and some cold medicines) may be harmful if you take too much for too long, especially if you have liver disease or drink alcohol heavily. - Get plenty of sleep. - Coffee has been shown to be protective of the liver. - Get vaccinated to protect against  hepatitis B   - You are at higher risk for heart disease given your fatty liver.  Please take any potential cardiac symptoms seriously.  I recommend that we repeat your liver testing for inflammation and fibrosis every 12-24 months. We can do this at the time of your colonoscopy in 2024.   Please let me know if you have any questions or concerns in the meantime.

## 2022-02-23 LAB — ANA: Anti Nuclear Antibody (ANA): POSITIVE — AB

## 2022-02-23 LAB — ANTI-NUCLEAR AB-TITER (ANA TITER): ANA Titer 1: 1:40 {titer} — ABNORMAL HIGH

## 2022-02-23 LAB — IGG: IgG (Immunoglobin G), Serum: 1020 mg/dL (ref 600–1540)

## 2022-03-01 ENCOUNTER — Encounter: Payer: Self-pay | Admitting: Gastroenterology

## 2022-03-04 LAB — ANTI-SMOOTH MUSCLE AB BY IFA: Anti-Smooth Muscle Ab by IFA: 1:20 {titer} — ABNORMAL HIGH

## 2022-03-09 ENCOUNTER — Encounter: Payer: Self-pay | Admitting: Adult Health

## 2022-03-09 ENCOUNTER — Non-Acute Institutional Stay: Payer: BLUE CROSS/BLUE SHIELD | Admitting: Adult Health

## 2022-03-09 VITALS — BP 162/100 | HR 79 | Temp 98.4°F | Ht 66.0 in | Wt 177.8 lb

## 2022-03-09 DIAGNOSIS — K760 Fatty (change of) liver, not elsewhere classified: Secondary | ICD-10-CM | POA: Diagnosis not present

## 2022-03-09 DIAGNOSIS — Z853 Personal history of malignant neoplasm of breast: Secondary | ICD-10-CM | POA: Diagnosis not present

## 2022-03-09 DIAGNOSIS — E785 Hyperlipidemia, unspecified: Secondary | ICD-10-CM

## 2022-03-09 DIAGNOSIS — E2839 Other primary ovarian failure: Secondary | ICD-10-CM | POA: Diagnosis not present

## 2022-03-09 DIAGNOSIS — E119 Type 2 diabetes mellitus without complications: Secondary | ICD-10-CM | POA: Diagnosis not present

## 2022-03-09 DIAGNOSIS — I10 Essential (primary) hypertension: Secondary | ICD-10-CM | POA: Diagnosis not present

## 2022-03-09 NOTE — Progress Notes (Signed)
Location:  [facility]  POS: Clinic  Provider: Royal Hawthorn, ANP  Code Status: Full Goals of Care:     12/17/2021    9:05 AM  Advanced Directives  Does Patient Have a Medical Advance Directive? Yes  Type of Paramedic of [place];Living will  Does patient want to make changes to medical advance directive? No - Patient declined  Copy of [place] in Chart? Yes - validated most recent copy scanned in chart (See row information)     Chief Complaint  Patient presents with   Medical Management of Chronic Issues    Patient returns to the clinic for her 3 month follow up.    Quality Metric Gaps    Verified patient is due for foot and eye exam.    HPI: Patient is a 76 y.o. female seen today for medical management of chronic diseases.    Elevated LFTs likely due to fatty liver per GI:  Lab Results  Component Value Date   ALT 49 (A) 12/11/2021   AST 37 (A) 12/11/2021   ALKPHOS 80 12/11/2021   BILITOT 0.6 12/04/2021   Galls stones and hepatic steatosis found on abd ultrasound 10/27/21 Recommend to stop alcohol, low carb diet per GI  Hx of colon polyps in July 2019 .F/U colonoscopy July 2024. Mother had hx of colon ca but was advanced age.   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 Does not need mammograms.   Dexa normal -0.4 09/2019  A1C 6.6, was 6.8 2 months ago.   HTN 148 /102 at first check.   HLD on crestor  Lab Results  Component Value Date   [place] 96 09/02/2021   Working with pt due to bulging disc in her back. [facility] by Dr Tonita Cong [facility]. Had xray and MRI.  Past Medical History:  Diagnosis Date   Arthritis    Per [facility] New Patient Packet    Bilateral malignant neoplasm of breast in female [facility]) 10/18/2018   Breast cancer [facility])    Per [facility] New Patient Packet    Essential hypertension 10/25/2015   High blood pressure    Per [place] New Patient  Packet    High cholesterol    Per [place] New Patient Packet    Hyperlipidemia LDL goal <130 10/25/2015   Prediabetes 11/19/2017   Shingles     Past Surgical History:  Procedure Laterality Date   CATARACT EXTRACTION  2018   Per [place] New Patient Packet, [place]     x2   COLONOSCOPY  2018   Per [place] new patient packet, Dr.Locker   MASTECTOMY Bilateral 02/02/2019   Per [facility] New Patient Packet, DrMichele Blackwood and Dr.Colon    MENISCECTOMY Right 2009   MENISCUS REPAIR  2004   Per [facility] New Patient Packet, Dr. Milagros Reap   TONSILLECTOMY  [phone]   Per [place] Patient Packet    No Known Allergies  Outpatient Encounter Medications as of 03/09/2022  Medication Sig   BIOTIN 5000 PO Take 1 capsule by mouth daily.   candesartan-hydrochlorothiazide (ATACAND HCT) 32-12.5 MG tablet Take 1 tablet by mouth daily.   Cholecalciferol (VITAMIN D3) 50 MCG (2000 UT) capsule Take 2,000 Units by mouth daily.   Cyanocobalamin (VITAMIN B-12) 2500 MCG SUBL    esomeprazole (NEXIUM) 20 MG capsule Take 20 mg by mouth daily at 12 noon.   Omega-3 Fatty Acids (  FISH OIL) 1000 MG CAPS Take 1 capsule by mouth daily.   Polyethyl Glycol-Propyl Glycol (SYSTANE) 0.4-0.3 % SOLN Apply to eye daily.   psyllium (METAMUCIL) 58.6 % packet Take 1 packet by mouth daily.   rosuvastatin (CRESTOR) 20 MG tablet Take 1 tablet (20 mg total) by mouth daily.   Vaginal Lubricant (REPLENS VA) Place vaginally as needed.   vitamin C (ASCORBIC ACID) 500 MG tablet Take 500 mg by mouth daily.   No facility-administered encounter medications on file as of 03/09/2022.    Review of Systems:  Review of Systems  Constitutional:  Negative for activity change, appetite change, chills, diaphoresis, fatigue, fever and unexpected weight change.  HENT:  Negative for congestion.   Respiratory:  Negative for cough, shortness of breath and wheezing.   Cardiovascular:  Negative for chest pain, palpitations and leg swelling.   Gastrointestinal:  Negative for abdominal distention, abdominal pain, constipation and diarrhea.  Genitourinary:  Negative for difficulty urinating and dysuria.  Musculoskeletal:  Positive for back pain. Negative for arthralgias, gait problem, joint swelling and myalgias.  Neurological:  Negative for dizziness, tremors, seizures, syncope, facial asymmetry, speech difficulty, weakness, light-headedness, numbness and headaches.  Psychiatric/Behavioral:  Negative for agitation, behavioral problems and confusion.     Health Maintenance  Topic Date Due   FOOT EXAM  Never done   COLONOSCOPY (Pts 45-64yr Insurance coverage will need to be confirmed)  08/24/2022 (Originally 03/14/2021)   INFLUENZA VACCINE  03/24/2022   HEMOGLOBIN A1C  08/08/2022   OPHTHALMOLOGY EXAM  10/23/2022   TETANUS/TDAP  08/27/2024   Pneumonia Vaccine 76 Years old  Completed   DEXA SCAN  Completed   COVID-19 Vaccine  Completed   Hepatitis C Screening  Completed   Zoster Vaccines- Shingrix  Completed   HPV VACCINES  Aged Out    Physical Exam: Vitals:   03/09/22 1445 03/09/22 1512  BP: (!) 148/102 (!) 162/100  Pulse: 79   Temp: 98.4 F (36.9 C)   SpO2: 96%   Weight: 177 lb 12.8 oz (80.6 kg)   Height: '5\' 6"'$  (1.676 m)    Body mass index is 28.7 kg/m. Physical Exam Vitals and nursing note reviewed.  Constitutional:      General: She is not in acute distress.    Appearance: She is not diaphoretic.  HENT:     Head: Normocephalic and atraumatic.     Right Ear: Tympanic membrane and ear canal normal.     Left Ear: Tympanic membrane and ear canal normal.     Mouth/Throat:     Mouth: Mucous membranes are moist.     Pharynx: Oropharynx is clear.  Neck:     Vascular: No JVD.  Cardiovascular:     Rate and Rhythm: Normal rate and regular rhythm.     Heart sounds: No murmur heard. Pulmonary:     Effort: Pulmonary effort is normal. No respiratory distress.     Breath sounds: Normal breath sounds. No wheezing.   Abdominal:     General: Bowel sounds are normal. There is no distension.     Palpations: Abdomen is soft.     Tenderness: There is no abdominal tenderness. There is no guarding.  Musculoskeletal:     Right lower leg: No edema.     Left lower leg: No edema.  Skin:    General: Skin is warm and dry.  Neurological:     Mental Status: She is alert and oriented to person, place, and time.  Psychiatric:  Mood and Affect: Mood normal.     Labs reviewed: Basic Metabolic Panel: Recent Labs    05/29/21 1420 09/02/21 0000 11/04/21 1446 12/04/21 1219 12/11/21 0000  NA 139   < > 140 138 141  K 4.1   < > 3.9 3.9 4.0  CL 105   < > 102 104 100  CO2 23   < > 26 24 24*  GLUCOSE 135*  --  113* 123*  --   BUN 20   < > '14 19 18  '$ CREATININE 0.90   < > 0.67 0.67 0.6  CALCIUM 9.4   < > 10.0 9.7 10.4   < > = values in this interval not displayed.   Liver Function Tests: Recent Labs    05/29/21 1420 05/29/21 1420 09/02/21 0000 11/04/21 1446 12/04/21 1219 12/11/21 0000  AST 59*   < > 108* 37* 69* 37*  ALT 54*   < > 91* 42* 59* 49*  ALKPHOS 68  --  82  --  64 80  BILITOT 0.5  --   --  0.6 0.6  --   PROT 7.3  --   --  7.4 7.6  --   ALBUMIN 4.0  --  4.6  --  4.5 4.8   < > = values in this interval not displayed.   Recent Labs    11/04/21 1446  LIPASE 22   No results for input(s): "AMMONIA" in the last 8760 hours. CBC: Recent Labs    05/29/21 1420 09/02/21 0000 11/04/21 1446 12/04/21 1219 12/11/21 0000  WBC 8.2   < > 5.6 6.9 5.2  NEUTROABS 5.0  --  3,030 4.0  --   HGB 12.8   < > 13.8 13.0 13.8  HCT 37.0   < > 41.0 38.0 41  MCV 94.1  --  96.2 94.1  --   PLT 192   < > 209 201 211   < > = values in this interval not displayed.   Lipid Panel: Recent Labs    09/02/21 0000 12/10/21 0958  CHOL 172 165  HDL 52  --   LDLCALC 96  --   TRIG 120 103   Lab Results  Component Value Date   HGBA1C 6.6 (H) 02/06/2022    Procedures since last visit: No results  found.  Assessment/Plan  1. Estrogen deficiency  - DG Bone Density; Future  2. Essential hypertension Elevated but has been normal prior to this apt. Will have her check [facility] with nurse and if consistently elevated will increased medication   3. Hyperlipidemia LDL goal <100 Lab Results  Component Value Date   LDLCALC 96 09/02/2021   Continue crestor and fish oil  4. Type 2 diabetes mellitus without complication, without long-term current use of insulin (HCC) Lab Results  Component Value Date   HGBA1C 6.6 (H) 02/06/2022  Recommend diet adherence and physical activity Recheck at next apt  5. Hepatic steatosis Recommend diet changes as stated by GI and below Recheck CMP at next visit.   6. Hx of breast ca Followed by oncology Does not need mammograms  Recommend Hepatitis B vaccine  Eye exam yearly  Recommend low carb low sugar diet, no alcohol  Low to moderate intensity exercise 30 min 3-5 x a week   Bone density screening   Labs/tests ordered:  * No order type specified * CMP A1C prior to apt Next appt:  4month   Total time 3107m:  time greater than  50% of total time spent doing pt counseling and coordination of care

## 2022-03-09 NOTE — Patient Instructions (Addendum)
Recommend Hepatitis B vaccine  Eye exam yearly  Recommend low carb low sugar diet, no alcohol  Low to moderate intensity exercise 30 min 3-5 x a week   Bone density screening

## 2022-03-18 ENCOUNTER — Other Ambulatory Visit: Payer: Self-pay | Admitting: Internal Medicine

## 2022-03-18 MED ORDER — AMLODIPINE BESYLATE 2.5 MG PO TABS: 2.5000 mg | ORAL_TABLET | Freq: Every day | ORAL | 3 refills | Status: DC

## 2022-03-18 NOTE — Progress Notes (Signed)
Victoria Wolfe called to tell that her BP is running high at home 170/100. Since she is on combination pill with HCTZ I will add Norvasc 2.5 mg QD. Continue to monitor BP at home.

## 2022-04-28 ENCOUNTER — Other Ambulatory Visit: Payer: Medicare Other

## 2022-05-14 ENCOUNTER — Emergency Department (HOSPITAL_BASED_OUTPATIENT_CLINIC_OR_DEPARTMENT_OTHER)
Admission: EM | Admit: 2022-05-14 | Discharge: 2022-05-14 | Disposition: A | Discharge status: Home / Self Care | Payer: BLUE CROSS/BLUE SHIELD | Attending: Emergency Medicine | Admitting: Emergency Medicine

## 2022-05-14 ENCOUNTER — Emergency Department (HOSPITAL_BASED_OUTPATIENT_CLINIC_OR_DEPARTMENT_OTHER): Payer: BLUE CROSS/BLUE SHIELD | Admitting: Radiology

## 2022-05-14 ENCOUNTER — Other Ambulatory Visit: Payer: Self-pay

## 2022-05-14 ENCOUNTER — Other Ambulatory Visit (HOSPITAL_BASED_OUTPATIENT_CLINIC_OR_DEPARTMENT_OTHER): Payer: Self-pay

## 2022-05-14 ENCOUNTER — Encounter (HOSPITAL_BASED_OUTPATIENT_CLINIC_OR_DEPARTMENT_OTHER): Payer: Self-pay | Admitting: Obstetrics and Gynecology

## 2022-05-14 DIAGNOSIS — Z79899 Other long term (current) drug therapy: Secondary | ICD-10-CM | POA: Diagnosis not present

## 2022-05-14 DIAGNOSIS — J168 Pneumonia due to other specified infectious organisms: Secondary | ICD-10-CM | POA: Diagnosis not present

## 2022-05-14 DIAGNOSIS — Z20822 Contact with and (suspected) exposure to covid-19: Secondary | ICD-10-CM | POA: Insufficient documentation

## 2022-05-14 DIAGNOSIS — I10 Essential (primary) hypertension: Secondary | ICD-10-CM | POA: Insufficient documentation

## 2022-05-14 DIAGNOSIS — J189 Pneumonia, unspecified organism: Secondary | ICD-10-CM

## 2022-05-14 DIAGNOSIS — R0602 Shortness of breath: Secondary | ICD-10-CM | POA: Diagnosis not present

## 2022-05-14 LAB — CBC
HCT: 35.7 % — ABNORMAL LOW (ref 36.0–46.0)
Hemoglobin: 12.6 g/dL (ref 12.0–15.0)
MCH: 32.7 pg (ref 26.0–34.0)
MCHC: 35.3 g/dL (ref 30.0–36.0)
MCV: 92.7 fL (ref 80.0–100.0)
Platelets: 208 10*3/uL (ref 150–400)
RBC: 3.85 MIL/uL — ABNORMAL LOW (ref 3.87–5.11)
RDW: 12.3 % (ref 11.5–15.5)
WBC: 10.5 10*3/uL (ref 4.0–10.5)
nRBC: 0 % (ref 0.0–0.2)

## 2022-05-14 LAB — BASIC METABOLIC PANEL
Anion gap: 9 (ref 5–15)
BUN: 17 mg/dL (ref 8–23)
CO2: 26 mmol/L (ref 22–32)
Calcium: 9.8 mg/dL (ref 8.9–10.3)
Chloride: 102 mmol/L (ref 98–111)
Creatinine, Ser: 0.68 mg/dL (ref 0.44–1.00)
GFR, Estimated: >60 mL/min (ref >60)
Glucose, Bld: 159 mg/dL — ABNORMAL HIGH (ref 70–99)
Potassium: 3.5 mmol/L (ref 3.5–5.1)
Sodium: 137 mmol/L (ref 135–145)

## 2022-05-14 LAB — SARS CORONAVIRUS 2 BY RT PCR: SARS Coronavirus 2 by RT PCR: NEGATIVE

## 2022-05-14 LAB — TROPONIN I (HIGH SENSITIVITY): Troponin I (High Sensitivity): 2 ng/L (ref <18)

## 2022-05-14 LAB — D-DIMER, QUANTITATIVE: D-Dimer, Quant: 0.72 ug/mL-FEU — ABNORMAL HIGH (ref 0.00–0.50)

## 2022-05-14 MED ORDER — AZITHROMYCIN 250 MG PO TABS
ORAL_TABLET | ORAL | 0 refills | Status: DC
  Filled 2022-05-14: qty 6, 5d supply, fill #0

## 2022-05-14 MED ORDER — AMOXICILLIN 500 MG PO CAPS
500.0000 mg | ORAL_CAPSULE | Freq: Three times a day (TID) | ORAL | 0 refills | Status: DC
  Filled 2022-05-14: qty 21, 7d supply, fill #0

## 2022-05-14 MED ORDER — AZITHROMYCIN 250 MG PO TABS
500.0000 mg | ORAL_TABLET | Freq: Once | ORAL | Status: AC
  Administered 2022-05-14: 500 mg via ORAL
  Filled 2022-05-14: qty 2

## 2022-05-14 MED ORDER — SODIUM CHLORIDE 0.9 % IV SOLN
2.0000 g | Freq: Once | INTRAVENOUS | Status: AC
  Administered 2022-05-14: 2 g via INTRAVENOUS
  Filled 2022-05-14: qty 20

## 2022-05-14 NOTE — ED Notes (Signed)
Discharge instructions, follow up care, and prescriptions reviewed and explained, pt verbalized understanding. Pt caox4 and ambulatory on d/c.  

## 2022-05-14 NOTE — ED Triage Notes (Signed)
Patient reports this is day 4 of shortness of breath. Patient reports when she lays down at night she can hear a crackling sound in her chest that she is concerned about. Patient reports no COVID test.

## 2022-05-14 NOTE — Discharge Instructions (Signed)
The x-ray showed pneumonia in your left lung.  Take the antibiotics as prescribed.  Follow-up with your doctor to be rechecked to make sure your symptoms are improving.  The radiologist did also recommend a follow-up x-ray to make sure the x-ray returns normal

## 2022-05-14 NOTE — ED Provider Notes (Signed)
[facility] EMERGENCY DEPT Provider Note   CSN: [id number] Arrival date & time: 05/14/22  1216     History  Chief Complaint  Patient presents with   Shortness of Breath         Victoria Wolfe is a 76 y.o. female.   Shortness of Breath    Patient has history of high blood pressure hypercholesterolemia, breast cancer who presents to the ED with complaints of chest discomfort and shortness of breath.  Patient states symptoms started 4 days ago.  She has had a slight cough but notices it gets worse at night.  She also feels a crackling sound in her chest at night.  She feels a heaviness and pressure in her chest.  The symptoms are not severe.  She has not noticed any leg swelling.  She does not have a history of heart disease.  No history of PE or DVT  Home Medications Prior to Admission medications   Medication Sig Start Date End Date Taking? Authorizing Provider  amoxicillin (AMOXIL) 500 MG capsule Take 1 capsule (500 mg total) by mouth 3 (three) times daily. 05/14/22  Yes Dorie Rank, MD  azithromycin (ZITHROMAX Z-PAK) 250 MG tablet Take 2 tablets by mouth on the first day and then take 1 tablet by mouth daily until complete 05/14/22  Yes Dorie Rank, MD  amLODipine (NORVASC) 2.5 MG tablet Take 1 tablet (2.5 mg total) by mouth daily. 03/18/22   [name], MD  BIOTIN 5000 PO Take 1 capsule by mouth daily.    [provider]  candesartan-hydrochlorothiazide (ATACAND HCT) 32-12.5 MG tablet Take 1 tablet by mouth daily. 03/19/21 03/19/22  [name], MD  Cholecalciferol (VITAMIN D3) 50 MCG (2000 UT) capsule Take 2,000 Units by mouth daily.    [provider]  Cyanocobalamin (VITAMIN B-12) 2500 MCG SUBL  03/21/20   [provider]  esomeprazole (NEXIUM) 20 MG capsule Take 20 mg by mouth daily at 12 noon.    [provider]  meloxicam (MOBIC) 7.5 MG tablet Take 7.5 mg by mouth 3 (three) times daily as needed. 04/13/22   [provider]  Omega-3 Fatty Acids (FISH OIL) 1000 MG CAPS Take 1 capsule by mouth daily.    [provider]  Polyethyl Glycol-Propyl Glycol (SYSTANE) 0.4-0.3 % SOLN Apply to eye daily.    [provider]  psyllium (METAMUCIL) 58.6 % packet Take 1 packet by mouth daily.    [provider]  rosuvastatin (CRESTOR) 20 MG tablet Take 1 tablet (20 mg total) by mouth daily. 03/19/21   [name], MD  Vaginal Lubricant (REPLENS VA) Place vaginally as needed.    [provider]  vitamin C (ASCORBIC ACID) 500 MG tablet Take 500 mg by mouth daily.    [provider]      Allergies    Patient has no known allergies.    Review of Systems   Review of Systems  Respiratory:  Positive for shortness of breath.     Physical Exam Updated Vital Signs BP (!) 143/70   Pulse 86   Temp 99.5 F (37.5 C) (Oral)   Resp 18   Ht 1.689 m (5' 6.5")   Wt 78.5 kg   SpO2 97%   BMI 27.50 kg/m  Physical Exam Vitals and nursing note reviewed.  Constitutional:      General: She is not in acute distress.    Appearance: She is well-developed.  HENT:     Head:  Normocephalic and atraumatic.     Right Ear: External ear normal.     Left Ear: External ear normal.  Eyes:     General: No scleral icterus.       Right eye: No discharge.        Left eye: No discharge.     Conjunctiva/sclera: Conjunctivae normal.  Neck:     Trachea: No tracheal deviation.  Cardiovascular:     Rate and Rhythm: Normal rate and regular rhythm.  Pulmonary:     Effort: Pulmonary effort is normal. No respiratory distress.     Breath sounds: Normal breath sounds. No stridor. No wheezing or rales.  Abdominal:     General: Bowel sounds are normal. There is no distension.     Palpations: Abdomen is soft.     Tenderness: There is no abdominal tenderness. There is no guarding or rebound.  Musculoskeletal:        General: No tenderness or deformity.     Cervical back: Neck supple.     Right  lower leg: No tenderness. No edema.     Left lower leg: No tenderness. No edema.  Skin:    General: Skin is warm and dry.     Findings: No rash.  Neurological:     General: No focal deficit present.     Mental Status: She is alert.     Cranial Nerves: No cranial nerve deficit (no facial droop, extraocular movements intact, no slurred speech).     Sensory: No sensory deficit.     Motor: No abnormal muscle tone or seizure activity.     Coordination: Coordination normal.  Psychiatric:        Mood and Affect: Mood normal.     ED Results / Procedures / Treatments   Labs (all labs ordered are listed, but only abnormal results are displayed) Labs Reviewed  BASIC METABOLIC PANEL - Abnormal; Notable for the following components:      Result Value   Glucose, Bld 159 (*)    All other components within normal limits  CBC - Abnormal; Notable for the following components:   RBC 3.85 (*)    HCT 35.7 (*)    All other components within normal limits  D-DIMER, QUANTITATIVE - Abnormal; Notable for the following components:   D-Dimer, Quant 0.72 (*)    All other components within normal limits  SARS CORONAVIRUS 2 BY RT PCR  TROPONIN I (HIGH SENSITIVITY)    EKG EKG Interpretation  Date/Time:  Thursday May 14 2022 12:54:05 EDT Ventricular Rate:  89 PR Interval:  151 QRS Duration: 102 QT Interval:  382 QTC Calculation: 465 R Axis:   94 Text Interpretation: Sinus rhythm Atrial premature complex Left posterior fascicular block Low voltage, precordial leads Abnormal R-wave progression, late transition No old tracing to compare Confirmed by Dorie Rank [phone]) on 05/14/2022 1:01:34 PM  Radiology DG Chest 2 View  Result Date: 05/14/2022 CLINICAL DATA:  Shortness of breath EXAM: CHEST - 2 VIEW COMPARISON:  10/24/2021 FINDINGS: The heart size and mediastinal contours are within normal limits. Aortic atherosclerosis. Multifocal airspace opacities throughout the left lung including confluent  consolidation in the left upper lobe. Right lung appears clear. No pleural effusion or pneumothorax. The visualized skeletal structures are unremarkable. IMPRESSION: Multifocal airspace opacities throughout the left lung including confluent consolidation in the left upper lobe compatible with multifocal pneumonia. Radiographic follow-up to resolution is recommended to exclude the possibility of an underlying mass. Electronically Signed   By: Hart Carwin  Plundo D.O.   On: 05/14/2022 13:20    Procedures Procedures    Medications Ordered in ED Medications  cefTRIAXone (ROCEPHIN) 2 g in sodium chloride 0.9 % 100 mL IVPB (2 g Intravenous New Bag/Given 05/14/22 1444)  azithromycin (ZITHROMAX) tablet 500 mg (500 mg Oral Given 05/14/22 1439)    ED Course/ Medical Decision Making/ A&P Clinical Course as of 05/14/22 1516  Thu May 14, 2022  1425 DG Chest 2 View Chest x-ray is consistent with multifocal pneumonia [JK]  1426 CBC(!) Normal [JK]  1245 Basic metabolic panel(!) Hyperglycemia noted [JK]  1426 SARS Coronavirus 2 by RT PCR (hospital order, performed in [facility] hospital lab) *cepheid single result test* Anterior Nasal Swab Normal [JK]  1426 D-dimer, quantitative(!) Elevated but within age-adjusted normal range [JK]  1426 Troponin I (High Sensitivity) Normal [JK]    Clinical Course User Index [JK] Dorie Rank, MD                           Medical Decision Making Problems Addressed: Pneumonia of left lung due to infectious organism, unspecified part of lung: acute illness or injury that poses a threat to life or bodily functions  Amount and/or Complexity of Data Reviewed Labs: ordered. Decision-making details documented in ED Course. Radiology: ordered and independent interpretation performed. Decision-making details documented in ED Course.  Risk Prescription drug management. Decision regarding hospitalization.   Presented with complaints of cough and some shortness of breath.   Exam reassuring with no signs of hypoxia or tachycardia.  No fever.  Exam and findings not suggestive of evolving sepsis.  X-ray however does show evidence of multifocal pneumonia.  Considered hospitalization with the chest x-ray finding and her age but overall low risk.  Curb 65 score is 1.   Will discharge home with a course of antibiotics.  Stressed the importance of close follow-up with her primary doctor to have her chest x-ray repeated.  Warning signs and return precautions discussed.        Final Clinical Impression(s) / ED Diagnoses Final diagnoses:  Pneumonia of left lung due to infectious organism, unspecified part of lung    Rx / DC Orders ED Discharge Orders          Ordered    amoxicillin (AMOXIL) 500 MG capsule  3 times daily        05/14/22 1514    azithromycin (ZITHROMAX Z-PAK) 250 MG tablet        05/14/22 1514              Dorie Rank, MD 05/14/22 1517

## 2022-05-18 ENCOUNTER — Telehealth: Payer: Self-pay | Admitting: Adult Health

## 2022-05-18 DIAGNOSIS — J189 Pneumonia, unspecified organism: Secondary | ICD-10-CM

## 2022-05-18 MED ORDER — LEVOFLOXACIN 500 MG PO TABS: 500.0000 mg | ORAL_TABLET | Freq: Every day | ORAL | 0 refills | Status: AC

## 2022-05-18 NOTE — Telephone Encounter (Signed)
Nurse at [facility] called to report Victoria Wolfe has a rash to the trunk and arms, likely from antibiotics. She has normal 02 sats. No breath sounds in left lung base. Xray is showing multifocal pna on the left. Discussed with Dr Lyndel Safe. Will give levaquin for 5 days and d/c zithromax and amoxicillin.

## 2022-06-01 ENCOUNTER — Ambulatory Visit (HOSPITAL_BASED_OUTPATIENT_CLINIC_OR_DEPARTMENT_OTHER)
Admission: RE | Admit: 2022-06-01 | Discharge: 2022-06-01 | Disposition: A | Discharge status: Home / Self Care | Payer: BLUE CROSS/BLUE SHIELD | Source: Ambulatory Visit | Attending: Adult Health | Admitting: Adult Health

## 2022-06-01 ENCOUNTER — Other Ambulatory Visit (HOSPITAL_BASED_OUTPATIENT_CLINIC_OR_DEPARTMENT_OTHER): Payer: Self-pay

## 2022-06-01 ENCOUNTER — Non-Acute Institutional Stay: Payer: BLUE CROSS/BLUE SHIELD | Admitting: Adult Health

## 2022-06-01 ENCOUNTER — Encounter: Payer: Self-pay | Admitting: Adult Health

## 2022-06-01 VITALS — BP 136/88 | HR 71 | Temp 97.1°F | Ht 66.0 in | Wt 174.4 lb

## 2022-06-01 DIAGNOSIS — L270 Generalized skin eruption due to drugs and medicaments taken internally: Secondary | ICD-10-CM

## 2022-06-01 DIAGNOSIS — J189 Pneumonia, unspecified organism: Secondary | ICD-10-CM

## 2022-06-01 DIAGNOSIS — Z23 Encounter for immunization: Secondary | ICD-10-CM | POA: Diagnosis not present

## 2022-06-01 MED ORDER — INFLUENZA VAC A&B SA ADJ QUAD 0.5 ML IM PRSY
PREFILLED_SYRINGE | INTRAMUSCULAR | 0 refills | Status: DC
  Filled 2022-06-01: qty 0.5, 1d supply, fill #0

## 2022-06-01 NOTE — Patient Instructions (Signed)
Ok to take covid and flu vaccine.   Will obtain CXR to ensure clearance of infection

## 2022-06-01 NOTE — Progress Notes (Signed)
Location:  [facility]  POS: clinic  Provider:  Cindi Carbon, ANP [facility] [phone]   Goals of Care:     05/14/2022   12:42 PM  Advanced Directives  Does Patient Have a Medical Advance Directive? No  Would patient like information on creating a medical advance directive? No - Patient declined     Chief Complaint  Patient presents with   Medical Management of Chronic Issues    2 week follow up Pneumonia.    Quality Metric Gaps    To discuss need for Foot Exam, Diabetic Kidney eval.,Covid and flu or postpone if patient refuses.     HPI: Patient is a 76 y.o. female seen today for pneumonia follow up  She was seen in the ED on 9/21 for sob, chest pressure, and cough. Xray showed multifocal airspace opacities throughout the left lung including confluent consolidation in the left upper lobe. Covid swab neg. Placed on zithromax and amoxicillin. Former smoker quite in 1995.   Nurse called [facility] on 9/25 and let us know she had a rash/hives developing to her arms and trunk Amoxil and zithromax discontinued and levaquin given for 5 days.   She returns to the clinic for follow up.   Past Medical History:  Diagnosis Date   Arthritis    Per [facility] New Patient Packet    Bilateral malignant neoplasm of breast in female [facility]) 10/18/2018   Breast cancer [facility])    Per [facility] New Patient Packet    Essential hypertension 10/25/2015   High blood pressure    Per [facility] New Patient Packet    High cholesterol    Per [facility] New Patient Packet    Hyperlipidemia LDL goal <130 10/25/2015   Prediabetes 11/19/2017   Shingles     Past Surgical History:  Procedure Laterality Date   CATARACT EXTRACTION  2018   Per [place] New Patient Packet, [place]     x2   COLONOSCOPY  2018   Per [place] new patient packet, Dr.Locker   MASTECTOMY Bilateral 02/02/2019   Per [facility] New Patient Packet, DrMichele Blackwood and Dr.Colon    MENISCECTOMY Right 2009   MENISCUS REPAIR   2004   Per [facility] New Patient Packet, Dr. Milagros Reap   TONSILLECTOMY  [phone]   Per [facility] Patient Packet    No Known Allergies  Outpatient Encounter Medications as of 06/01/2022  Medication Sig   amLODipine (NORVASC) 2.5 MG tablet Take 1 tablet (2.5 mg total) by mouth daily.   BIOTIN 5000 PO Take 1 capsule by mouth daily.   Cholecalciferol (VITAMIN D3) 50 MCG (2000 UT) capsule Take 2,000 Units by mouth daily.   Cyanocobalamin (VITAMIN B-12) 2500 MCG SUBL    esomeprazole (NEXIUM) 20 MG capsule Take 20 mg by mouth daily at 12 noon.   meloxicam (MOBIC) 7.5 MG tablet Take 7.5 mg by mouth 3 (three) times daily as needed.   Omega-3 Fatty Acids (FISH OIL) 1000 MG CAPS Take 1 capsule by mouth daily.   Polyethyl Glycol-Propyl Glycol (SYSTANE) 0.4-0.3 % SOLN Apply to eye daily.   psyllium (METAMUCIL) 58.6 % packet Take 1 packet by mouth daily.   rosuvastatin (CRESTOR) 20 MG tablet Take 1 tablet (20 mg total) by mouth daily.   Vaginal Lubricant (REPLENS VA) Place vaginally as needed.   vitamin C (ASCORBIC ACID) 500 MG tablet Take 500 mg by mouth daily.   candesartan-hydrochlorothiazide (ATACAND HCT) 32-12.5 MG tablet Take 1 tablet by mouth  daily.   No facility-administered encounter medications on file as of 06/01/2022.    Review of Systems:  Review of Systems  Constitutional:  Negative for activity change, appetite change, chills, diaphoresis, fatigue, fever and unexpected weight change.  HENT:  Negative for congestion.   Respiratory:  Negative for cough, shortness of breath and wheezing.   Cardiovascular:  Negative for chest pain, palpitations and leg swelling.  Gastrointestinal:  Negative for abdominal distention, abdominal pain, constipation and diarrhea.  Genitourinary:  Negative for difficulty urinating and dysuria.  Musculoskeletal:  Negative for arthralgias, back pain, gait problem, joint swelling and myalgias.  Neurological:  Negative for dizziness, tremors, seizures, syncope, facial  asymmetry, speech difficulty, weakness, light-headedness, numbness and headaches.  Psychiatric/Behavioral:  Negative for agitation, behavioral problems and confusion.     Health Maintenance  Topic Date Due   FOOT EXAM  Never done   Diabetic kidney evaluation - Urine ACR  Never done   COVID-19 Vaccine (6 - Moderna risk series) 07/10/2021   INFLUENZA VACCINE  03/24/2022   COLONOSCOPY (Pts 45-87yr Insurance coverage will need to be confirmed)  08/24/2022 (Originally 03/14/2021)   HEMOGLOBIN A1C  08/08/2022   OPHTHALMOLOGY EXAM  10/23/2022   Diabetic kidney evaluation - GFR measurement  05/15/2023   TETANUS/TDAP  08/27/2024   Pneumonia Vaccine 76 Years old  Completed   DEXA SCAN  Completed   Hepatitis C Screening  Completed   Zoster Vaccines- Shingrix  Completed   HPV VACCINES  Aged Out    Physical Exam: Vitals:   06/01/22 1318  BP: 136/88  Pulse: 71  Temp: (!) 97.1 F (36.2 C)  SpO2: 96%  Weight: 174 lb 6.4 oz (79.1 kg)  Height: '5\' 6"'$  (1.676 m)   Body mass index is 28.15 kg/m. Physical Exam Vitals reviewed.  HENT:     Head: Normocephalic and atraumatic.     Nose: Nose normal.  Cardiovascular:     Rate and Rhythm: Normal rate and regular rhythm.     Heart sounds: No murmur heard. Pulmonary:     Effort: Pulmonary effort is normal.     Breath sounds: Normal breath sounds.  Neurological:     Mental Status: She is alert.     Labs reviewed: Basic Metabolic Panel: Recent Labs    11/04/21 1446 12/04/21 1219 12/11/21 0000 05/14/22 1327  NA 140 138 141 137  K 3.9 3.9 4.0 3.5  CL 102 104 100 102  CO2 26 24 24* 26  GLUCOSE 113* 123*  --  159*  BUN '14 19 18 17  '$ CREATININE 0.67 0.67 0.6 0.68  CALCIUM 10.0 9.7 10.4 9.8   Liver Function Tests: Recent Labs    09/02/21 0000 11/04/21 1446 12/04/21 1219 12/11/21 0000  AST 108* 37* 69* 37*  ALT 91* 42* 59* 49*  ALKPHOS 82  --  64 80  BILITOT  --  0.6 0.6  --   PROT  --  7.4 7.6  --   ALBUMIN 4.6  --  4.5 4.8    Recent Labs    11/04/21 1446  LIPASE 22   No results for input(s): "AMMONIA" in the last 8760 hours. CBC: Recent Labs    11/04/21 1446 12/04/21 1219 12/11/21 0000 05/14/22 1327  WBC 5.6 6.9 5.2 10.5  NEUTROABS 3,030 4.0  --   --   HGB 13.8 13.0 13.8 12.6  HCT 41.0 38.0 41 35.7*  MCV 96.2 94.1  --  92.7  PLT 209 201 211 208   Lipid  Panel: Recent Labs    09/02/21 0000 12/10/21 0958  CHOL 172 165  HDL 52  --   LDLCALC 96  --   TRIG 120 103   Lab Results  Component Value Date   HGBA1C 6.6 (H) 02/06/2022    Procedures since last visit: DG Chest 2 View  Result Date: 05/14/2022 CLINICAL DATA:  Shortness of breath EXAM: CHEST - 2 VIEW COMPARISON:  10/24/2021 FINDINGS: The heart size and mediastinal contours are within normal limits. Aortic atherosclerosis. Multifocal airspace opacities throughout the left lung including confluent consolidation in the left upper lobe. Right lung appears clear. No pleural effusion or pneumothorax. The visualized skeletal structures are unremarkable. IMPRESSION: Multifocal airspace opacities throughout the left lung including confluent consolidation in the left upper lobe compatible with multifocal pneumonia. Radiographic follow-up to resolution is recommended to exclude the possibility of an underlying mass. Electronically Signed   By: Davina Poke D.O.   On: 05/14/2022 13:20    Assessment/Plan  1. Multifocal pneumonia Resolved symptoms.  - DG Chest 2 View; Future  Ok to get covid and flu vaccines.   2. Drug rash Hives after amoxil and zithromax, unclear which antibiotic caused   Labs/tests ordered:  * No order type specified * CMP A1C prior to next apt.  Next appt: F/U with Dr Lyndel Safe in the next few weeks for routine check       Total time 81mn:  time greater than 50% of total time spent doing pt counseling and coordination of care

## 2022-06-09 ENCOUNTER — Encounter: Payer: BLUE CROSS/BLUE SHIELD | Admitting: Internal Medicine

## 2022-06-10 ENCOUNTER — Other Ambulatory Visit: Payer: Self-pay | Admitting: Internal Medicine

## 2022-06-10 ENCOUNTER — Encounter: Payer: BLUE CROSS/BLUE SHIELD | Admitting: Internal Medicine

## 2022-06-10 DIAGNOSIS — E785 Hyperlipidemia, unspecified: Secondary | ICD-10-CM

## 2022-06-11 DIAGNOSIS — E1065 Type 1 diabetes mellitus with hyperglycemia: Secondary | ICD-10-CM | POA: Diagnosis not present

## 2022-06-11 LAB — COMPREHENSIVE METABOLIC PANEL
Albumin: 4.3 (ref 3.5–5.0)
Calcium: 9.8 (ref 8.7–10.7)
Globulin: 2.4

## 2022-06-11 LAB — HEMOGLOBIN A1C: Hemoglobin A1C: 7.1

## 2022-06-11 LAB — BASIC METABOLIC PANEL
BUN: 18 (ref 4–21)
CO2: 26 — AB (ref 13–22)
Chloride: 104 (ref 99–108)
Creatinine: 0.7 (ref 0.5–1.1)
Glucose: 136
Potassium: 4.1 mEq/L (ref 3.5–5.1)
Sodium: 142 (ref 137–147)

## 2022-06-11 LAB — HEPATIC FUNCTION PANEL
ALT: 28 U/L (ref 7–35)
AST: 24 (ref 13–35)
Alkaline Phosphatase: 75 (ref 25–125)
Bilirubin, Total: 0.6

## 2022-06-13 ENCOUNTER — Other Ambulatory Visit: Payer: Self-pay | Admitting: Internal Medicine

## 2022-06-13 DIAGNOSIS — I10 Essential (primary) hypertension: Secondary | ICD-10-CM

## 2022-06-16 ENCOUNTER — Encounter: Payer: Self-pay | Admitting: Internal Medicine

## 2022-06-16 ENCOUNTER — Non-Acute Institutional Stay: Payer: BLUE CROSS/BLUE SHIELD | Admitting: Internal Medicine

## 2022-06-16 VITALS — BP 138/84 | HR 80 | Temp 98.1°F | Resp 17 | Ht 66.0 in | Wt 176.0 lb

## 2022-06-16 DIAGNOSIS — K760 Fatty (change of) liver, not elsewhere classified: Secondary | ICD-10-CM | POA: Diagnosis not present

## 2022-06-16 DIAGNOSIS — I10 Essential (primary) hypertension: Secondary | ICD-10-CM

## 2022-06-16 DIAGNOSIS — E785 Hyperlipidemia, unspecified: Secondary | ICD-10-CM | POA: Diagnosis not present

## 2022-06-16 DIAGNOSIS — E119 Type 2 diabetes mellitus without complications: Secondary | ICD-10-CM | POA: Diagnosis not present

## 2022-06-16 DIAGNOSIS — E538 Deficiency of other specified B group vitamins: Secondary | ICD-10-CM

## 2022-06-16 DIAGNOSIS — Z853 Personal history of malignant neoplasm of breast: Secondary | ICD-10-CM

## 2022-06-16 DIAGNOSIS — E559 Vitamin D deficiency, unspecified: Secondary | ICD-10-CM

## 2022-06-16 DIAGNOSIS — L600 Ingrowing nail: Secondary | ICD-10-CM

## 2022-06-16 DIAGNOSIS — R350 Frequency of micturition: Secondary | ICD-10-CM

## 2022-06-16 MED ORDER — DOXYCYCLINE HYCLATE 100 MG PO TABS: 100.0000 mg | ORAL_TABLET | Freq: Two times a day (BID) | ORAL | 0 refills | Status: DC

## 2022-06-17 NOTE — Progress Notes (Addendum)
Location:  [place] of Service:  Clinic (12)  Provider:   Code Status:  Goals of Care:     06/16/2022    2:37 PM  Advanced Directives  Does Patient Have a Medical Advance Directive? Yes  Type of Paramedic of [place];Living will;Out of facility DNR (pink MOST or yellow form)  Does patient want to make changes to medical advance directive? No - Patient declined  Copy of [place] in Chart? Yes - validated most recent copy scanned in chart (See row information)     Chief Complaint  Patient presents with   Medical Management of Chronic Issues    2 week follow up   Quality Metric Gaps    Discussed the need for flu, covd , micro and a foot exam     HPI: Patient is a 76 y.o. female seen today for medical management of chronic diseases.    [facility] [place] [facility] [place] [facility]  Hypertension, prediabetes and diet-controlled, HLD, B12 deficiency  Recently diagnosed with multifocal pneumonia.  Followed by allergic reaction to Keflex and azithromycin.  Eventually was put on Levaquin.  Symptoms resolved and repeat x-ray has been negative  Elevated LFTs.  Ultrasound showed NASH  Per GI Max control of Diabetes Avoid Alcohol  Annual screening  Elevated A1C Recent Lung infection and not exercising  Right great toe had ingrown toe nail It is red and and hurting   Urinary In continence Mostly at night  Lower Back Pain Bilateral mastectomy for breast cancer Not on tamoxifen any more due to side effects Follows with Dr Lestine Mount with no assist.  No falls 2 daughters in [place] who are POA Past Medical History:  Diagnosis Date   Arthritis    Per [facility] New Patient Packet    Bilateral malignant neoplasm of breast in female [facility]) 10/18/2018   Breast cancer [facility])    Per [facility] New Patient Packet    Essential hypertension 10/25/2015   High blood pressure    Per [place] New Patient Packet    High cholesterol     Per [facility] New Patient Packet    Hyperlipidemia LDL goal <130 10/25/2015   Prediabetes 11/19/2017   Shingles     Past Surgical History:  Procedure Laterality Date   CATARACT EXTRACTION  2018   Per [place] New Patient Packet, [place]     x2   COLONOSCOPY  2018   Per [place] [facility] [place] new patient packet, Dr.Locker   MASTECTOMY Bilateral 02/02/2019   Per [place] New Patient Packet, DrMichele Blackwood and Dr.Colon    MENISCECTOMY Right 2009   MENISCUS REPAIR  2004   Per [facility] New Patient Packet, Dr. Milagros Reap   TONSILLECTOMY  [phone]   Per [place] Patient Packet    Allergies  Allergen Reactions   Amoxicillin Rash    Had rash after taking amoxil and zithromax, not sure which one caused the rash   Zithromax [Azithromycin] Rash    Had rash after taking zithromax and amoxil at the same time, not sure which antibiotic caused the rash    Outpatient Encounter Medications as of 06/16/2022  Medication Sig   amLODipine (NORVASC) 2.5 MG tablet Take 1 tablet (2.5 mg total) by mouth daily.   BIOTIN 5000 PO Take 1 capsule by mouth daily.   candesartan-hydrochlorothiazide (ATACAND HCT) 32-12.5 MG tablet TAKE 1 TABLET BY MOUTH EVERY DAY   Cholecalciferol (VITAMIN D3)  50 MCG (2000 UT) capsule Take 2,000 Units by mouth daily.   Cyanocobalamin (VITAMIN B-12) 2500 MCG SUBL    doxycycline (VIBRA-TABS) 100 MG tablet Take 1 tablet (100 mg total) by mouth 2 (two) times daily.   esomeprazole (NEXIUM) 20 MG capsule Take 20 mg by mouth daily at 12 noon.   influenza vaccine adjuvanted (FLUAD) 0.5 ML injection Inject into the muscle.   meloxicam (MOBIC) 7.5 MG tablet Take 7.5 mg by mouth daily.   Polyethyl Glycol-Propyl Glycol (SYSTANE) 0.4-0.3 % SOLN Apply to eye daily.   psyllium (METAMUCIL) 58.6 % packet Take 1 packet by mouth daily.   rosuvastatin (CRESTOR) 20 MG tablet TAKE 1 TABLET BY MOUTH EVERY DAY   Vaginal Lubricant (REPLENS VA) Place vaginally as needed.   vitamin C (ASCORBIC ACID) 500 MG  tablet Take 500 mg by mouth daily.   Omega-3 Fatty Acids (FISH OIL) 1000 MG CAPS Take 1 capsule by mouth daily.   No facility-administered encounter medications on file as of 06/16/2022.    Review of Systems:  Review of Systems  Constitutional:  Negative for activity change and appetite change.  HENT: Negative.    Respiratory:  Negative for cough and shortness of breath.   Cardiovascular:  Negative for leg swelling.  Gastrointestinal:  Negative for constipation.  Genitourinary: Negative.   Musculoskeletal:  Negative for arthralgias, gait problem and myalgias.  Skin: Negative.   Neurological:  Negative for dizziness and weakness.  Psychiatric/Behavioral:  Negative for confusion, dysphoric mood and sleep disturbance.     Health Maintenance  Topic Date Due   Medicare Annual Wellness (AWV)  Never done   FOOT EXAM  Never done   Diabetic kidney evaluation - Urine ACR  Never done   COVID-19 Vaccine (6 - Moderna risk series) 07/10/2021   INFLUENZA VACCINE  03/24/2022   COLONOSCOPY (Pts 45-70yr Insurance coverage will need to be confirmed)  08/24/2022 (Originally 03/14/2021)   OPHTHALMOLOGY EXAM  10/23/2022   HEMOGLOBIN A1C  12/11/2022   Diabetic kidney evaluation - GFR measurement  06/12/2023   TETANUS/TDAP  08/27/2024   Pneumonia Vaccine 76 Years old  Completed   DEXA SCAN  Completed   Hepatitis C Screening  Completed   Zoster Vaccines- Shingrix  Completed   HPV VACCINES  Aged Out    Physical Exam: Vitals:   06/16/22 1438  BP: 138/84  Pulse: 80  Resp: 17  Temp: 98.1 F (36.7 C)  TempSrc: Temporal  SpO2: 96%  Weight: 176 lb (79.8 kg)  Height: '5\' 6"'$  (1.676 m)   Body mass index is 28.41 kg/m. Physical Exam Vitals reviewed.  Constitutional:      Appearance: Normal appearance.  HENT:     Head: Normocephalic.     Nose: Nose normal.     Mouth/Throat:     Mouth: Mucous membranes are moist.     Pharynx: Oropharynx is clear.  Eyes:     Pupils: Pupils are equal, round,  and reactive to light.  Cardiovascular:     Rate and Rhythm: Normal rate and regular rhythm.     Pulses: Normal pulses.     Heart sounds: Normal heart sounds. No murmur heard. Pulmonary:     Effort: Pulmonary effort is normal.     Breath sounds: Normal breath sounds.  Abdominal:     General: Abdomen is flat. Bowel sounds are normal.     Palpations: Abdomen is soft.  Musculoskeletal:        General: No swelling.  Cervical back: Neck supple.     Comments: Foot exam done No Sensory loss DP normal Her Right Great toe was red and tender  No Pus or discharge  Skin:    General: Skin is warm.  Neurological:     General: No focal deficit present.     Mental Status: She is alert and oriented to person, place, and time.  Psychiatric:        Mood and Affect: Mood normal.        Thought Content: Thought content normal.     Labs reviewed: Basic Metabolic Panel: Recent Labs    11/04/21 1446 12/04/21 1219 12/11/21 0000 05/14/22 1327 06/11/22 0000  NA 140 138 141 137 142  K 3.9 3.9 4.0 3.5 4.1  CL 102 104 100 102 104  CO2 26 24 24* 26 26*  GLUCOSE 113* 123*  --  159*  --   BUN '14 19 18 17 18  '$ CREATININE 0.67 0.67 0.6 0.68 0.7  CALCIUM 10.0 9.7 10.4 9.8 9.8   Liver Function Tests: Recent Labs    11/04/21 1446 12/04/21 1219 12/11/21 0000 06/11/22 0000  AST 37* 69* 37* 24  ALT 42* 59* 49* 28  ALKPHOS  --  64 80 75  BILITOT 0.6 0.6  --   --   PROT 7.4 7.6  --   --   ALBUMIN  --  4.5 4.8 4.3   Recent Labs    11/04/21 1446  LIPASE 22   No results for input(s): "AMMONIA" in the last 8760 hours. CBC: Recent Labs    11/04/21 1446 12/04/21 1219 12/11/21 0000 05/14/22 1327  WBC 5.6 6.9 5.2 10.5  NEUTROABS 3,030 4.0  --   --   HGB 13.8 13.0 13.8 12.6  HCT 41.0 38.0 41 35.7*  MCV 96.2 94.1  --  92.7  PLT 209 201 211 208   Lipid Panel: Recent Labs    09/02/21 0000 12/10/21 0958  CHOL 172 165  HDL 52  --   LDLCALC 96  --   TRIG 120 103   Lab Results   Component Value Date   HGBA1C 7.1 06/11/2022    Procedures since last visit: DG Chest 2 View  Result Date: 06/01/2022 CLINICAL DATA:  Diagnosed with multifocal pneumonia 2 weeks ago. EXAM: CHEST - 2 VIEW COMPARISON:  Chest x-rays dated 05/14/2022 and 10/24/2021. FINDINGS: Today's image is mislabeled (reversed RIGHT-LEFT). Heart size and mediastinal contours are stable. Lungs are now clear. No pleural effusion is seen. Osseous structures about the chest are unremarkable. IMPRESSION: No active cardiopulmonary disease. No evidence of pneumonia on today's exam. The multifocal pneumonia has resolved since the earlier chest x-ray of 05/14/2022. Electronically Signed   By: Franki Cabot M.D.   On: 06/01/2022 18:26    Assessment/Plan 1. Ingrown nail of great toe infection Seems infected Doxycyline for 7 days Let us know if not better  2. Type 2 diabetes mellitus without complication, without long-term current use of insulin (HCC) A1C elevated But she just had Pneumonia and was on Prednisone before We talked about adding Metformin but can wait till Next visit in 3 months with repeat A1C Per GI she needs better control of her sugars to help her NASH  3. Essential hypertension Doing well on Atacand and NOrvasc  4. Hepatic steatosis Per GI Annual Monitoring Avoid Alcohol She drinks 2 glasses 3-4 /week   5. Hyperlipidemia LDL goal <100 On statin Repeat Lipid panel  6. Hx of breast cancer Follows  with Dr Krista Blue   7. Urinary frequency Better since she is taking Atacand in the morning  8. B12 deficiency On Supplement 9. Vitamin D deficiency Supplement 10 Low back pain Takes Mobic Everyday with food Also on Nexium   Needs Mammogram reminded patient    Labs/tests ordered:  A1C,CBC,CMP and Lipid before next visit Next appt:  09/28/2022

## 2022-06-18 DIAGNOSIS — Z23 Encounter for immunization: Secondary | ICD-10-CM | POA: Diagnosis not present

## 2022-06-24 ENCOUNTER — Encounter: Payer: Self-pay | Admitting: Internal Medicine

## 2022-06-25 ENCOUNTER — Telehealth: Payer: Self-pay | Admitting: Internal Medicine

## 2022-06-25 MED ORDER — DOXYCYCLINE HYCLATE 100 MG PO TABS: 100.0000 mg | ORAL_TABLET | Freq: Two times a day (BID) | ORAL | 0 refills | Status: DC

## 2022-06-25 NOTE — Telephone Encounter (Signed)
Discussed with Clinic Nurse in [facility] and Patient still has some redness and tenderness in her Toe.Where she has ingrown Toe nail. She has no discharge. Will continue Doxycyline for 5 more days She is also going to call Podiatrist to trim that area.

## 2022-06-30 DIAGNOSIS — M898X7 Other specified disorders of bone, ankle and foot: Secondary | ICD-10-CM | POA: Diagnosis not present

## 2022-06-30 DIAGNOSIS — L03031 Cellulitis of right toe: Secondary | ICD-10-CM | POA: Diagnosis not present

## 2022-06-30 DIAGNOSIS — L600 Ingrowing nail: Secondary | ICD-10-CM | POA: Diagnosis not present

## 2022-07-07 ENCOUNTER — Encounter: Payer: Self-pay | Admitting: Internal Medicine

## 2022-07-07 ENCOUNTER — Non-Acute Institutional Stay: Payer: BLUE CROSS/BLUE SHIELD | Admitting: Internal Medicine

## 2022-07-07 VITALS — BP 140/82 | HR 80 | Temp 98.1°F | Resp 17 | Ht 66.5 in | Wt 176.4 lb

## 2022-07-07 DIAGNOSIS — I10 Essential (primary) hypertension: Secondary | ICD-10-CM

## 2022-07-07 DIAGNOSIS — B372 Candidiasis of skin and nail: Secondary | ICD-10-CM | POA: Diagnosis not present

## 2022-07-07 DIAGNOSIS — E119 Type 2 diabetes mellitus without complications: Secondary | ICD-10-CM | POA: Diagnosis not present

## 2022-07-07 MED ORDER — CLOTRIMAZOLE-BETAMETHASONE 1-0.05 % EX CREA: 1.0000 | TOPICAL_CREAM | Freq: Two times a day (BID) | CUTANEOUS | 0 refills | Status: DC

## 2022-07-07 MED ORDER — FLUCONAZOLE 100 MG PO TABS: 100.0000 mg | ORAL_TABLET | Freq: Every day | ORAL | 0 refills | Status: DC

## 2022-07-10 NOTE — Progress Notes (Signed)
Location: [place] of Service:  Clinic (12)  Provider:   Code Status:  Goals of Care:     07/07/2022    3:19 PM  Advanced Directives  Does Patient Have a Medical Advance Directive? Yes  Type of Paramedic of [place];Living will;Out of facility DNR (pink MOST or yellow form)  Does patient want to make changes to medical advance directive? No - Patient declined  Copy of [place] in Chart? Yes - validated most recent copy scanned in chart (See row information)     Chief Complaint  Patient presents with   Acute Visit    Patient states she has a rash where she had her C-section years ago. Patent states she has tried creams but no relief from the itching and burning    HPI: Patient is a 76 y.o. female seen today for an acute visit for Rash in her Lower abdominal area Itching and she has been applying Calamine lotion  Recent In grown Toe nail Got Doxycyline for 2 weeks And then had to undergo surgery per podiatry  Last visit  [facility] [place] [facility]   Hypertension, prediabetes and diet-controlled, HLD, B12 deficiency   Recently diagnosed with multifocal pneumonia.  Followed by allergic reaction to Keflex and azithromycin.  Eventually was put on Levaquin.  Symptoms resolved and repeat x-ray has been negative   Elevated LFTs.  Ultrasound showed NASH  Per GI Max control of Diabetes Avoid Alcohol  Annual screening   Elevated A1C Recent Lung infection and not exercising  Urinary In continence Mostly at night  Lower Back Pain Bilateral mastectomy for breast cancer Not on tamoxifen any more due to side effects Follows with Dr Annamaria Boots  Past Medical History:  Diagnosis Date   Arthritis    Per [facility] New Patient Packet    Bilateral malignant neoplasm of breast in female [facility]) 10/18/2018   Breast cancer [facility])    Per [facility] New Patient Packet    Essential hypertension 10/25/2015   High blood  pressure    [facility] [place] [facility] Patient [facility]    High cholesterol    Per [facility] New Patient Packet    Hyperlipidemia LDL goal <130 10/25/2015   Prediabetes 11/19/2017   Shingles     Past Surgical History:  Procedure Laterality Date   CATARACT EXTRACTION  2018   Per [place] New Patient Packet, [place]     x2   COLONOSCOPY  2018   Per [place] new patient packet, Dr.Locker   MASTECTOMY Bilateral 02/02/2019   Per [place] New Patient Packet, DrMichele Blackwood and Dr.Colon    MENISCECTOMY Right 2009   MENISCUS REPAIR  2004   Per [facility] New Patient Packet, Dr. Milagros Reap   TOENAIL TRIMMING Left    ingrown in big toe   TONSILLECTOMY  1951   Per [facility] New Patient Packet    Allergies  Allergen Reactions   Amoxicillin Rash    Had rash after taking amoxil and zithromax, not sure which one caused the rash   Zithromax [Azithromycin] Rash    Had rash after taking zithromax and amoxil at the same time, not sure which antibiotic caused the rash    Outpatient Encounter Medications as of 07/07/2022  Medication Sig   amLODipine (NORVASC) 2.5 MG tablet Take 1 tablet (2.5 mg total) by mouth daily.   BIOTIN 5000 PO Take 1 capsule by mouth daily.   candesartan-hydrochlorothiazide (ATACAND  HCT) 32-12.5 MG tablet TAKE 1 TABLET BY MOUTH EVERY DAY   Cholecalciferol (VITAMIN D3) 50 MCG (2000 UT) capsule Take 2,000 Units by mouth daily.   clotrimazole-betamethasone (LOTRISONE) cream Apply 1 Application topically 2 (two) times daily.   Cyanocobalamin (VITAMIN B-12) 2500 MCG SUBL    esomeprazole (NEXIUM) 20 MG capsule Take 20 mg by mouth daily at 12 noon.   fluconazole (DIFLUCAN) 100 MG tablet Take 1 tablet (100 mg total) by mouth daily.   influenza vaccine adjuvanted (FLUAD) 0.5 ML injection Inject into the muscle.   meloxicam (MOBIC) 7.5 MG tablet Take 7.5 mg by mouth daily.   Polyethyl Glycol-Propyl Glycol (SYSTANE) 0.4-0.3 % SOLN Apply to eye daily.   psyllium (METAMUCIL) 58.6 % packet Take 1  packet by mouth daily.   rosuvastatin (CRESTOR) 20 MG tablet TAKE 1 TABLET BY MOUTH EVERY DAY   Vaginal Lubricant (REPLENS VA) Place vaginally as needed.   vitamin C (ASCORBIC ACID) 500 MG tablet Take 500 mg by mouth daily.   [DISCONTINUED] doxycycline (VIBRA-TABS) 100 MG tablet Take 1 tablet (100 mg total) by mouth 2 (two) times daily.   [DISCONTINUED] Omega-3 Fatty Acids (FISH OIL) 1000 MG CAPS Take 1 capsule by mouth daily.   No facility-administered encounter medications on file as of 07/07/2022.    Review of Systems:  Review of Systems  Constitutional:  Negative for activity change and appetite change.  HENT: Negative.    Respiratory:  Negative for cough and shortness of breath.   Cardiovascular:  Negative for leg swelling.  Gastrointestinal:  Negative for constipation.  Genitourinary: Negative.   Musculoskeletal:  Negative for arthralgias, gait problem and myalgias.  Skin:  Positive for rash.  Neurological:  Negative for dizziness and weakness.  Psychiatric/Behavioral:  Negative for confusion, dysphoric mood and sleep disturbance.     Health Maintenance  Topic Date Due   Medicare Annual Wellness (AWV)  Never done   Diabetic kidney evaluation - Urine ACR  Never done   COVID-19 Vaccine (6 - Moderna risk series) 07/10/2021   COLONOSCOPY (Pts 45-29yr Insurance coverage will need to be confirmed)  08/24/2022 (Originally 03/14/2021)   OPHTHALMOLOGY EXAM  10/23/2022   HEMOGLOBIN A1C  12/11/2022   Diabetic kidney evaluation - GFR measurement  06/12/2023   FOOT EXAM  06/17/2023   Pneumonia Vaccine 76 Years old  Completed   INFLUENZA VACCINE  Completed   DEXA SCAN  Completed   Hepatitis C Screening  Completed   Zoster Vaccines- Shingrix  Completed   HPV VACCINES  Aged Out    Physical Exam: Vitals:   07/07/22 1519  BP: (!) 140/82  Pulse: 80  Resp: 17  Temp: 98.1 F (36.7 C)  TempSrc: Temporal  SpO2: 97%  Weight: 176 lb 6.4 oz (80 kg)  Height: 5' 6.5" (1.689 m)    Body mass index is 28.05 kg/m. Physical Exam Vitals reviewed.  Constitutional:      Appearance: Normal appearance.  HENT:     Head: Normocephalic.     Nose: Nose normal.     Mouth/Throat:     Mouth: Mucous membranes are moist.     Pharynx: Oropharynx is clear.  Eyes:     Pupils: Pupils are equal, round, and reactive to light.  Cardiovascular:     Rate and Rhythm: Normal rate and regular rhythm.     Pulses: Normal pulses.     Heart sounds: Normal heart sounds. No murmur heard. Pulmonary:     Effort: Pulmonary effort is normal.  Breath sounds: Normal breath sounds.  Abdominal:     General: Abdomen is flat. Bowel sounds are normal.     Palpations: Abdomen is soft.  Musculoskeletal:        General: No swelling.     Cervical back: Neck supple.  Skin:    General: Skin is warm.     Comments: Maculopapular rash in between her lower abdominal area between the folds  Neurological:     General: No focal deficit present.     Mental Status: She is alert and oriented to person, place, and time.  Psychiatric:        Mood and Affect: Mood normal.        Thought Content: Thought content normal.     Labs reviewed: Basic Metabolic Panel: Recent Labs    11/04/21 1446 12/04/21 1219 12/11/21 0000 05/14/22 1327 06/11/22 0000  NA 140 138 141 137 142  K 3.9 3.9 4.0 3.5 4.1  CL 102 104 100 102 104  CO2 26 24 24* 26 26*  GLUCOSE 113* 123*  --  159*  --   BUN '14 19 18 17 18  '$ CREATININE 0.67 0.67 0.6 0.68 0.7  CALCIUM 10.0 9.7 10.4 9.8 9.8   Liver Function Tests: Recent Labs    11/04/21 1446 12/04/21 1219 12/11/21 0000 06/11/22 0000  AST 37* 69* 37* 24  ALT 42* 59* 49* 28  ALKPHOS  --  64 80 75  BILITOT 0.6 0.6  --   --   PROT 7.4 7.6  --   --   ALBUMIN  --  4.5 4.8 4.3   Recent Labs    11/04/21 1446  LIPASE 22   No results for input(s): "AMMONIA" in the last 8760 hours. CBC: Recent Labs    11/04/21 1446 12/04/21 1219 12/11/21 0000 05/14/22 1327  WBC 5.6  6.9 5.2 10.5  NEUTROABS 3,030 4.0  --   --   HGB 13.8 13.0 13.8 12.6  HCT 41.0 38.0 41 35.7*  MCV 96.2 94.1  --  92.7  PLT 209 201 211 208   Lipid Panel: Recent Labs    09/02/21 0000 12/10/21 0958  CHOL 172 165  HDL 52  --   LDLCALC 96  --   TRIG 120 103   Lab Results  Component Value Date   HGBA1C 7.1 06/11/2022    Procedures since last visit: No results found.  Assessment/Plan 1. Candidal intertrigo Diflucan 100 mg 3 days Lotrisone BID till resolves  2. Type 2 diabetes mellitus without complication, without long-term current use of insulin (HCC) A1C elevated But she just had Pneumonia and was on Prednisone before We talked about adding Metformin but can wait till Next visit in 3 months with repeat A1C Per GI she needs better control of her sugars to help her NASH     Labs/tests ordered:  * No order type specified * Next appt:  09/28/2022

## 2022-07-21 DIAGNOSIS — L03031 Cellulitis of right toe: Secondary | ICD-10-CM | POA: Diagnosis not present

## 2022-07-21 DIAGNOSIS — L600 Ingrowing nail: Secondary | ICD-10-CM | POA: Diagnosis not present

## 2022-07-21 DIAGNOSIS — M898X7 Other specified disorders of bone, ankle and foot: Secondary | ICD-10-CM | POA: Diagnosis not present

## 2022-07-21 DIAGNOSIS — M19071 Primary osteoarthritis, right ankle and foot: Secondary | ICD-10-CM | POA: Diagnosis not present

## 2022-07-21 DIAGNOSIS — M19072 Primary osteoarthritis, left ankle and foot: Secondary | ICD-10-CM | POA: Diagnosis not present

## 2022-07-23 ENCOUNTER — Other Ambulatory Visit (HOSPITAL_BASED_OUTPATIENT_CLINIC_OR_DEPARTMENT_OTHER): Payer: Self-pay

## 2022-07-23 MED ORDER — AREXVY 120 MCG/0.5ML IM SUSR
INTRAMUSCULAR | 0 refills | Status: DC
  Filled 2022-07-23: qty 0.5, 1d supply, fill #0

## 2022-07-25 ENCOUNTER — Other Ambulatory Visit: Payer: Self-pay | Admitting: Family

## 2022-07-26 ENCOUNTER — Encounter: Payer: Self-pay | Admitting: Internal Medicine

## 2022-07-28 ENCOUNTER — Other Ambulatory Visit: Payer: Self-pay | Admitting: Internal Medicine

## 2022-07-28 MED ORDER — CLOTRIMAZOLE-BETAMETHASONE 1-0.05 % EX CREA: 1.0000 | TOPICAL_CREAM | Freq: Two times a day (BID) | CUTANEOUS | 0 refills | Status: DC

## 2022-07-28 MED ORDER — NYSTATIN 100000 UNIT/GM EX POWD: 1.0000 | Freq: Three times a day (TID) | CUTANEOUS | 1 refills | Status: DC

## 2022-07-28 NOTE — Progress Notes (Signed)
Patient continues to have Rash in her [place] Nurse examined her and it is better but not completly resolved Patient was taught to keep dry pad to avoid moisture Continue Lotrisone and Also will order Nystatin Powder

## 2022-08-04 DIAGNOSIS — M19071 Primary osteoarthritis, right ankle and foot: Secondary | ICD-10-CM | POA: Diagnosis not present

## 2022-08-04 DIAGNOSIS — M898X7 Other specified disorders of bone, ankle and foot: Secondary | ICD-10-CM | POA: Diagnosis not present

## 2022-08-04 DIAGNOSIS — M792 Neuralgia and neuritis, unspecified: Secondary | ICD-10-CM | POA: Diagnosis not present

## 2022-08-04 DIAGNOSIS — L03031 Cellulitis of right toe: Secondary | ICD-10-CM | POA: Diagnosis not present

## 2022-08-04 DIAGNOSIS — M19072 Primary osteoarthritis, left ankle and foot: Secondary | ICD-10-CM | POA: Diagnosis not present

## 2022-08-04 DIAGNOSIS — L600 Ingrowing nail: Secondary | ICD-10-CM | POA: Diagnosis not present

## 2022-08-20 ENCOUNTER — Encounter: Payer: Self-pay | Admitting: Adult Health

## 2022-08-20 ENCOUNTER — Telehealth: Payer: Self-pay

## 2022-08-20 ENCOUNTER — Telehealth (INDEPENDENT_AMBULATORY_CARE_PROVIDER_SITE_OTHER): Payer: BLUE CROSS/BLUE SHIELD | Admitting: Adult Health

## 2022-08-20 DIAGNOSIS — R768 Other specified abnormal immunological findings in serum: Secondary | ICD-10-CM | POA: Diagnosis not present

## 2022-08-20 HISTORY — DX: Other specified abnormal immunological findings in serum: R76.8

## 2022-08-20 MED ORDER — BENZONATATE 100 MG PO CAPS: 100.0000 mg | ORAL_CAPSULE | Freq: Three times a day (TID) | ORAL | 0 refills | Status: DC | PRN

## 2022-08-20 MED ORDER — MOLNUPIRAVIR EUA 200MG CAPSULE: 4.0000 | ORAL_CAPSULE | Freq: Two times a day (BID) | ORAL | 0 refills | Status: AC

## 2022-08-20 NOTE — Telephone Encounter (Signed)
Ms. nickie, warwick are scheduled for a virtual visit with your provider today.    Just as we do with appointments in the office, we must obtain your consent to participate.  Your consent will be active for this visit and any virtual visit you may have with one of our providers in the next 365 days.    If you have a MyChart account, I can also send a copy of this consent to you electronically.  All virtual visits are billed to your insurance company just like a traditional visit in the office.  As this is a virtual visit, video technology does not allow for your provider to perform a traditional examination.  This may limit your provider's ability to fully assess your condition.  If your provider identifies any concerns that need to be evaluated in person or the need to arrange testing such as labs, EKG, etc, we will make arrangements to do so.    Although advances in technology are sophisticated, we cannot ensure that it will always work on either your end or our end.  If the connection with a video visit is poor, we may have to switch to a telephone visit.  With either a video or telephone visit, we are not always able to ensure that we have a secure connection.   I need to obtain your verbal consent now.   Are you willing to proceed with your visit today?   Tamsen Reist has provided verbal consent on 08/20/2022 for a virtual visit (video or telephone).   Leigh Aurora [place], [place] 08/20/2022  2:02 PM

## 2022-08-20 NOTE — Progress Notes (Signed)
   This service is provided via telemedicine  No vital signs collected/recorded due to the encounter was a telemedicine visit.   Location of patient (ex: home, work):  Home  Patient consents to a telephone visit: Yes, see telephone visit dated 08/20/22  Location of the provider (ex: office, home):  [facility], Office   Name of any referring provider:  N/A  Names of all persons participating in the telemedicine service and their role in the encounter:  S.Chrae B/CMA, Ok Edwards NP, and Patient   Time spent on call:  9 min with medical assistant    HPI: she was exposed over the past weekend to covid. She started developing cold type symptoms with some congestion present. She tested and was positive. There are no reports of any fevers. She has a dry cough. No shortness of breath; no chest pain. She denies any nausea vomiting or diarrhea. We discussed anti viral medications and will take lagerviro 800 mg twice daily for 5 days. Will also need something for cough; will give her tesselon 100 mg every 8hours as needed. She has been instructed to call the office is she is not feeling any better in the next 10 days. She has been told to treat fevers and body aches with tylenol as directed. She verbalized understanding of all information give. She is up to date with all of her covid vaccines.    Time spent with patient 13 minutes:

## 2022-08-30 ENCOUNTER — Encounter (HOSPITAL_BASED_OUTPATIENT_CLINIC_OR_DEPARTMENT_OTHER): Payer: Self-pay | Admitting: Emergency Medicine

## 2022-08-30 ENCOUNTER — Emergency Department (HOSPITAL_BASED_OUTPATIENT_CLINIC_OR_DEPARTMENT_OTHER)
Admission: EM | Admit: 2022-08-30 | Discharge: 2022-08-30 | Disposition: A | Discharge status: Home / Self Care | Payer: BLUE CROSS/BLUE SHIELD | Attending: Emergency Medicine | Admitting: Emergency Medicine

## 2022-08-30 ENCOUNTER — Other Ambulatory Visit: Payer: Self-pay

## 2022-08-30 DIAGNOSIS — U071 COVID-19: Secondary | ICD-10-CM | POA: Diagnosis not present

## 2022-08-30 DIAGNOSIS — R052 Subacute cough: Secondary | ICD-10-CM

## 2022-08-30 HISTORY — DX: Pneumonia, unspecified organism: J18.9

## 2022-08-30 LAB — RESP PANEL BY RT-PCR (RSV, FLU A&B, COVID)  RVPGX2
Influenza A by PCR: NEGATIVE
Influenza B by PCR: NEGATIVE
Resp Syncytial Virus by PCR: NEGATIVE
SARS Coronavirus 2 by RT PCR: POSITIVE — AB

## 2022-08-30 NOTE — ED Notes (Signed)
Discharge paperwork given and verbally understood. 

## 2022-08-30 NOTE — ED Triage Notes (Signed)
Pt tested positive for covid 1 week and a half ago. Took the antiviral, got better, but yesterday started with post nasal drip, cough ,chest congested,.

## 2022-08-30 NOTE — ED Provider Notes (Signed)
[facility] Provider Note   CSN: [id number] Arrival date & time: 08/30/22  0849     History  Chief Complaint  Patient presents with   Cough    Victoria Wolfe is a 77 y.o. female.  HPI 77 year old female presents today with reports of nasal congestion and cough pain with with positive COVID test at home 10 days ago.  She was placed on molnupiravir and felt improved.  She completed the course and now she is again noting some nasal congestion and cough.  She is not dyspneic.  She has not had fever.  She has had some ongoing rhinorrhea.  She is not diabetic.      Home Medications Prior to Admission medications   Medication Sig Start Date End Date Taking? Authorizing Provider  albuterol (VENTOLIN HFA) 108 (90 Base) MCG/ACT inhaler TAKE 2 PUFFS BY MOUTH EVERY 6 HOURS AS NEEDED FOR WHEEZE OR SHORTNESS OF BREATH 07/27/22   [name], MD  amLODipine (NORVASC) 2.5 MG tablet Take 1 tablet (2.5 mg total) by mouth daily. 03/18/22   [name], MD  benzonatate (TESSALON) 100 MG capsule Take 1 capsule (100 mg total) by mouth 3 (three) times daily as needed for cough. 08/20/22   Gerlene Fee, NP  BIOTIN 5000 PO Take 1 capsule by mouth daily.    [provider]  candesartan-hydrochlorothiazide (ATACAND HCT) 32-12.5 MG tablet TAKE 1 TABLET BY MOUTH EVERY DAY 06/15/22   [name], MD  Cholecalciferol (VITAMIN D3) 50 MCG (2000 UT) capsule Take 2,000 Units by mouth daily.    [provider]  Cyanocobalamin (VITAMIN B-12) 2500 MCG SUBL Take 1 tablet by mouth daily. 03/21/20   [provider]  esomeprazole (NEXIUM) 20 MG capsule Take 20 mg by mouth daily at 12 noon.    [provider]  meloxicam (MOBIC) 7.5 MG tablet Take 7.5 mg by mouth daily. 04/13/22   [provider]  nystatin (MYCOSTATIN/NYSTOP) powder Apply 1 Application topically 3 (three) times daily. 07/28/22   [name], MD  Polyethyl Glycol-Propyl  Glycol (SYSTANE) 0.4-0.3 % SOLN Apply to eye daily.    [provider]  psyllium (METAMUCIL) 58.6 % packet Take 1 packet by mouth daily.    [provider]  rosuvastatin (CRESTOR) 20 MG tablet TAKE 1 TABLET BY MOUTH EVERY DAY 06/10/22   [name], MD  vitamin C (ASCORBIC ACID) 500 MG tablet Take 500 mg by mouth daily.    [provider]      Allergies    Amoxicillin and Zithromax [azithromycin]    Review of Systems   Review of Systems  Physical Exam Updated Vital Signs BP (!) 167/87 (BP Location: Right Arm)   Pulse 88   Temp 98.2 F (36.8 C)   Resp 18   SpO2 98%  Physical Exam Vitals and nursing note reviewed.  Constitutional:      Appearance: Normal appearance.  HENT:     Head: Normocephalic.     Right Ear: External ear normal.     Left Ear: External ear normal.     Nose: Nose normal.     Mouth/Throat:     Pharynx: Oropharynx is clear.  Eyes:     Pupils: Pupils are equal, round, and reactive to light.  Cardiovascular:     Rate and Rhythm: Normal rate and regular rhythm.     Pulses: Normal pulses.  Pulmonary:     Effort: Pulmonary effort is normal.  Abdominal:  General: Abdomen is flat. Bowel sounds are normal.  Musculoskeletal:        General: Normal range of motion.     Cervical back: Normal range of motion.  Skin:    General: Skin is warm.     Capillary Refill: Capillary refill takes less than 2 seconds.  Neurological:     General: No focal deficit present.     Mental Status: She is alert.  Psychiatric:        Mood and Affect: Mood normal.     ED Results / Procedures / Treatments   Labs (all labs ordered are listed, but only abnormal results are displayed) Labs Reviewed  RESP PANEL BY RT-PCR (RSV, FLU A&B, COVID)  [id number] - Abnormal; Notable for the following components:      Result Value   SARS Coronavirus 2 by RT PCR POSITIVE (*)    All other components within normal limits    EKG None  Radiology No results  found.  Procedures Procedures    Medications Ordered in ED Medications - No data to display  ED Course/ Medical Decision Making/ A&P                           Medical Decision Making  77 year old female recent positive home COVID test presents today complaining of symptoms after completing medications to treat.  I suspect that she has some rebound COVID.  However, cannot rule out other viral infection.  On physical exam she is not febrile or tachypneic, oxygen saturations are normal, and her lungs are clear to auscultation with a normal heart rate.  Clinically, I do not feel that she has a secondary pneumonia or sinusitis.  Management will be with symptom management and we have discussed using honey and hot tea and other conservative therapy as well as acetaminophen for symptom management.  She voices understanding.  COVID test was sent from triage.  I do not think that she needs to stay to receive these results and she can follow-up on MyChart.  She is in agreement with this plan.        Final Clinical Impression(s) / ED Diagnoses Final diagnoses:  COVID  Subacute cough    Rx / DC Orders ED Discharge Orders     None         Pattricia Boss, MD 08/30/22 1001

## 2022-08-30 NOTE — Discharge Instructions (Signed)
Please follow-up on MyChart for review of tests that include COVID, flu A, flu B, and RSV.  Currently you seem well with normal oxygen levels and heart rate.  If you become short of breath or have worsening symptoms, please return for reevaluation.  Please use acetaminophen as needed for body aches or fever.  Please manage your cough with honey and hot liquids.  If you decide to use over-the-counter cough medicine, please check with your pharmacist for side effects.

## 2022-09-24 DIAGNOSIS — E119 Type 2 diabetes mellitus without complications: Secondary | ICD-10-CM | POA: Diagnosis not present

## 2022-09-24 DIAGNOSIS — K762 Central hemorrhagic necrosis of liver: Secondary | ICD-10-CM | POA: Diagnosis not present

## 2022-09-24 DIAGNOSIS — I10 Essential (primary) hypertension: Secondary | ICD-10-CM | POA: Diagnosis not present

## 2022-09-24 LAB — BASIC METABOLIC PANEL
BUN: 15 (ref 4–21)
CO2: 22 (ref 13–22)
Chloride: 105 (ref 99–108)
Creatinine: 0.6 (ref 0.5–1.1)
Glucose: 150
Potassium: 4.2 mEq/L (ref 3.5–5.1)
Sodium: 141 (ref 137–147)

## 2022-09-24 LAB — CBC AND DIFFERENTIAL
HCT: 40 (ref 36–46)
Hemoglobin: 13.6 (ref 12.0–16.0)
Platelets: 194 10*3/uL (ref 150–400)
WBC: 4.5

## 2022-09-24 LAB — LIPID PANEL
Cholesterol: 178 (ref 0–200)
HDL: 58 (ref 35–70)
LDL Cholesterol: 91
LDl/HDL Ratio: 3.1
Triglycerides: 143 (ref 40–160)

## 2022-09-24 LAB — HEPATIC FUNCTION PANEL
ALT: 42 U/L — AB (ref 7–35)
AST: 30 (ref 13–35)
Alkaline Phosphatase: 73 (ref 25–125)
Bilirubin, Total: 0.5

## 2022-09-24 LAB — COMPREHENSIVE METABOLIC PANEL
Albumin: 4.3 (ref 3.5–5.0)
Calcium: 9.9 (ref 8.7–10.7)
eGFR: >90

## 2022-09-24 LAB — HEMOGLOBIN A1C: Hemoglobin A1C: 6.8

## 2022-09-24 LAB — CBC: RBC: 4.19 (ref 3.87–5.11)

## 2022-09-28 ENCOUNTER — Encounter: Payer: Self-pay | Admitting: Adult Health

## 2022-09-28 ENCOUNTER — Non-Acute Institutional Stay: Payer: BLUE CROSS/BLUE SHIELD | Admitting: Adult Health

## 2022-09-28 VITALS — BP 130/74 | HR 79 | Temp 97.7°F | Resp 17 | Ht 66.5 in | Wt 182.3 lb

## 2022-09-28 DIAGNOSIS — E119 Type 2 diabetes mellitus without complications: Secondary | ICD-10-CM

## 2022-09-28 DIAGNOSIS — I10 Essential (primary) hypertension: Secondary | ICD-10-CM

## 2022-09-28 DIAGNOSIS — E785 Hyperlipidemia, unspecified: Secondary | ICD-10-CM

## 2022-09-28 DIAGNOSIS — R7989 Other specified abnormal findings of blood chemistry: Secondary | ICD-10-CM | POA: Diagnosis not present

## 2022-09-28 DIAGNOSIS — G8929 Other chronic pain: Secondary | ICD-10-CM

## 2022-09-28 DIAGNOSIS — C50812 Malignant neoplasm of overlapping sites of left female breast: Secondary | ICD-10-CM | POA: Diagnosis not present

## 2022-09-28 DIAGNOSIS — M545 Low back pain, unspecified: Secondary | ICD-10-CM | POA: Diagnosis not present

## 2022-09-28 DIAGNOSIS — M549 Dorsalgia, unspecified: Secondary | ICD-10-CM | POA: Insufficient documentation

## 2022-09-28 HISTORY — DX: Dorsalgia, unspecified: M54.9

## 2022-09-28 NOTE — Progress Notes (Signed)
Location:  [facility]  POS: Clinic  Provider: Royal Hawthorn, ANP  Code Status: Full Goals of Care:     09/28/2022    1:51 PM  Advanced Directives  Does Patient Have a Medical Advance Directive? Yes  Type of Paramedic of [place];Living will;Out of facility DNR (pink MOST or yellow form)  Does patient want to make changes to medical advance directive? No - Patient declined     Chief Complaint  Patient presents with   Medical Management of Chronic Issues    3 month follow up   Immunizations    Discussed the need for Updated covid 19 vaccine last 1 06/15/2022   Quality Metric Gaps    Patient is due for colonoscopy, diabetic kidney  and AWV     HPI: Patient is a 77 y.o. female seen today for medical management of chronic diseases.    Had covid and then rebound covid at the holidays but is now better.   Exercise: swims, walks, and does [employer] classes  Alcohol: 4 glasses of wine per week   Elevated LFTs likely due to fatty liver per GI which have improved. :  Lab Results  Component Value Date   ALT 42 (A) 09/24/2022   AST 30 09/24/2022   ALKPHOS 73 09/24/2022   BILITOT 0.6 12/04/2021    Hx of colon polyps in July 2019 .F/U colonoscopy July 2024. Mother had hx of colon ca but was advanced age.   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 Does not need mammograms.   Dexa normal -0.4 09/2019 scheduled for next Monday    HTN  130/74  HLD on crestor  Lab Results  Component Value Date   [facility] 91 09/24/2022   Working with pt due to bulging disc in her back. [facility] by Dr Tonita Cong [facility]. Had xray and MRI. Takes mobic prn> doing well. Intermittent pain. Alter activity when needed.  Past Medical History:  Diagnosis Date   Arthritis    Per [facility] New Patient Packet    Bilateral malignant neoplasm of breast in female [facility]) 10/18/2018   Breast cancer [facility])    Per [facility] New  Patient Packet    Essential hypertension 10/25/2015   High blood pressure    Per [place] New Patient Packet    High cholesterol    Per [place] New Patient Packet    Hyperlipidemia LDL goal <130 10/25/2015   Pneumonia    Prediabetes 11/19/2017   Shingles     Past Surgical History:  Procedure Laterality Date   CATARACT EXTRACTION  2018   Per [place] New Patient Packet, August     x2   COLONOSCOPY  2018   Per [place] new patient packet, Dr.Locker   MASTECTOMY Bilateral 02/02/2019   Per [place] New Patient Packet, DrMichele Blackwood and Dr.Colon    MENISCECTOMY Right 2009   MENISCUS REPAIR  2004   Per [facility] New Patient Packet, Dr. Milagros Reap   TOENAIL TRIMMING Left    ingrown in big toe   TONSILLECTOMY  1951   Per [facility] New Patient Packet    Allergies  Allergen Reactions   Amoxicillin Rash    Had rash after taking amoxil and zithromax, not sure which one caused the rash   Zithromax [Azithromycin] Rash    Had rash after taking zithromax and amoxil at the same time, not sure which antibiotic caused the  rash    Outpatient Encounter Medications as of 09/28/2022  Medication Sig   amLODipine (NORVASC) 2.5 MG tablet Take 1 tablet (2.5 mg total) by mouth daily.   BIOTIN 5000 PO Take 1 capsule by mouth daily.   candesartan-hydrochlorothiazide (ATACAND HCT) 32-12.5 MG tablet TAKE 1 TABLET BY MOUTH EVERY DAY   Cholecalciferol (VITAMIN D3) 50 MCG (2000 UT) capsule Take 2,000 Units by mouth daily.   Cyanocobalamin (VITAMIN B-12) 2500 MCG SUBL Take 1 tablet by mouth daily.   esomeprazole (NEXIUM) 20 MG capsule Take 20 mg by mouth daily at 12 noon.   meloxicam (MOBIC) 7.5 MG tablet Take 7.5 mg by mouth daily as needed.   nystatin (MYCOSTATIN/NYSTOP) powder Apply 1 Application topically 3 (three) times daily.   Polyethyl Glycol-Propyl Glycol (SYSTANE) 0.4-0.3 % SOLN Apply to eye daily.   psyllium (METAMUCIL) 58.6 % packet Take 1 packet by mouth daily.   rosuvastatin (CRESTOR) 20 MG  tablet TAKE 1 TABLET BY MOUTH EVERY DAY   vitamin C (ASCORBIC ACID) 500 MG tablet Take 500 mg by mouth daily.   [DISCONTINUED] albuterol (VENTOLIN HFA) 108 (90 Base) MCG/ACT inhaler TAKE 2 PUFFS BY MOUTH EVERY 6 HOURS AS NEEDED FOR WHEEZE OR SHORTNESS OF BREATH   [DISCONTINUED] benzonatate (TESSALON) 100 MG capsule Take 1 capsule (100 mg total) by mouth 3 (three) times daily as needed for cough.   No facility-administered encounter medications on file as of 09/28/2022.    Review of Systems:  Review of Systems  Constitutional:  Negative for activity change, appetite change, chills, diaphoresis, fatigue, fever and unexpected weight change.  HENT:  Negative for congestion.   Respiratory:  Negative for cough, shortness of breath and wheezing.   Cardiovascular:  Negative for chest pain, palpitations and leg swelling.  Gastrointestinal:  Negative for abdominal distention, abdominal pain, constipation and diarrhea.  Genitourinary:  Negative for difficulty urinating and dysuria.  Musculoskeletal:  Positive for back pain (intermittent). Negative for arthralgias, gait problem, joint swelling and myalgias.  Neurological:  Negative for dizziness, tremors, seizures, syncope, facial asymmetry, speech difficulty, weakness, light-headedness, numbness and headaches.  Psychiatric/Behavioral:  Negative for agitation, behavioral problems and confusion.     Health Maintenance  Topic Date Due   Medicare Annual Wellness (AWV)  Never done   Diabetic kidney evaluation - Urine ACR  Never done   COLONOSCOPY (Pts 45-34yr Insurance coverage will need to be confirmed)  03/14/2021   COVID-19 Vaccine (7 - 2023-24 season) 10/14/2022 (Originally 08/13/2022)   OPHTHALMOLOGY EXAM  10/23/2022   HEMOGLOBIN A1C  03/25/2023   FOOT EXAM  06/17/2023   Diabetic kidney evaluation - eGFR measurement  09/25/2023   DTaP/Tdap/Td (2 - Td or Tdap) 08/27/2024   Pneumonia Vaccine 77 Years old  Completed   INFLUENZA VACCINE  Completed    DEXA SCAN  Completed   Hepatitis C Screening  Completed   Zoster Vaccines- Shingrix  Completed   HPV VACCINES  Aged Out    Physical Exam: Vitals:   09/28/22 1349  BP: 130/74  Pulse: 79  Resp: 17  Temp: 97.7 F (36.5 C)  TempSrc: Temporal  SpO2: 95%  Weight: 182 lb 4.8 oz (82.7 kg)  Height: 5' 6.5" (1.689 m)   Body mass index is 28.98 kg/m. Physical Exam Vitals and nursing note reviewed.  Constitutional:      General: She is not in acute distress.    Appearance: She is not diaphoretic.  HENT:     Head: Normocephalic and atraumatic.  Right Ear: Tympanic membrane and ear canal normal.     Left Ear: Tympanic membrane and ear canal normal.     Nose: Nose normal.     Mouth/Throat:     Mouth: Mucous membranes are moist.     Pharynx: Oropharynx is clear.  Eyes:     Conjunctiva/sclera: Conjunctivae normal.     Pupils: Pupils are equal, round, and reactive to light.  Neck:     Vascular: No JVD.  Cardiovascular:     Rate and Rhythm: Normal rate and regular rhythm.     Heart sounds: No murmur heard. Pulmonary:     Effort: Pulmonary effort is normal. No respiratory distress.     Breath sounds: Normal breath sounds. No wheezing.  Abdominal:     General: Bowel sounds are normal. There is no distension.     Palpations: Abdomen is soft.     Tenderness: There is no abdominal tenderness. There is no guarding.  Musculoskeletal:     Cervical back: No rigidity or tenderness.     Right lower leg: No edema.     Left lower leg: No edema.  Lymphadenopathy:     Cervical: Cervical adenopathy (left anterior chain) present.  Skin:    General: Skin is warm and dry.  Neurological:     Mental Status: She is alert and oriented to person, place, and time.  Psychiatric:        Mood and Affect: Mood normal.     Labs reviewed: Basic Metabolic Panel: Recent Labs    11/04/21 1446 12/04/21 1219 12/11/21 0000 05/14/22 1327 06/11/22 0000 09/24/22 0000  NA 140 138   < > 137 142  141  K 3.9 3.9   < > 3.5 4.1 4.2  CL 102 104   < > 102 104 105  CO2 26 24   < > 26 26* 22  GLUCOSE 113* 123*  --  159*  --   --   BUN 14 19   < > '17 18 15  '$ CREATININE 0.67 0.67   < > 0.68 0.7 0.6  CALCIUM 10.0 9.7   < > 9.8 9.8 9.9   < > = values in this interval not displayed.   Liver Function Tests: Recent Labs    11/04/21 1446 12/04/21 1219 12/04/21 1219 12/11/21 0000 06/11/22 0000 09/24/22 0000  AST 37* 69*  --  37* 24 30  ALT 42* 59*  --  49* 28 42*  ALKPHOS  --  64   < > 80 75 73  BILITOT 0.6 0.6  --   --   --   --   PROT 7.4 7.6  --   --   --   --   ALBUMIN  --  4.5   < > 4.8 4.3 4.3   < > = values in this interval not displayed.   Recent Labs    11/04/21 1446  LIPASE 22   No results for input(s): "AMMONIA" in the last 8760 hours. CBC: Recent Labs    11/04/21 1446 12/04/21 1219 12/11/21 0000 05/14/22 1327 09/24/22 0000  WBC 5.6 6.9 5.2 10.5 4.5  NEUTROABS 3,030 4.0  --   --   --   HGB 13.8 13.0 13.8 12.6 13.6  HCT 41.0 38.0 41 35.7* 40  MCV 96.2 94.1  --  92.7  --   PLT 209 201 211 208 194   Lipid Panel: Recent Labs    12/10/21 0958 09/24/22 0000  CHOL 165 178  HDL  --  58  LDLCALC  --  91  TRIG 103 143   Lab Results  Component Value Date   HGBA1C 6.8 09/24/2022    Procedures since last visit: No results found.  Assessment/Plan  1. Estrogen deficiency Going for dexa next week Weight bearing exercise   2. Essential hypertension Controlled on norvasc and atacand hct  3. Hyperlipidemia LDL goal <100 Lab Results  Component Value Date   LDLCALC 91 09/24/2022   Continue crestor   4. Type 2 diabetes mellitus without complication, without long-term current use of insulin (HCC) Lab Results  Component Value Date   HGBA1C 6.8 09/24/2022  Recommend diet adherence and physical activity Recheck at next apt  5. Hepatic steatosis Lower carb diet Reduced alcohol intake, doing much better   6. Hx of breast ca Followed by  oncology Does not need mammograms  7. Back pain Use mobic prn due to htn    Labs/tests ordered:  * No order type specified * BMP A1C Urine micro albumin prior to apt Next appt:  4 months   Total time 25mn:  time greater than 50% of total time spent doing pt counseling and coordination of care

## 2022-09-29 ENCOUNTER — Encounter: Payer: Self-pay | Admitting: Gastroenterology

## 2022-10-05 ENCOUNTER — Telehealth: Payer: Self-pay | Admitting: Adult Health

## 2022-10-05 ENCOUNTER — Ambulatory Visit
Admission: RE | Admit: 2022-10-05 | Discharge: 2022-10-05 | Disposition: A | Discharge status: Home / Self Care | Payer: Medicare Other | Source: Ambulatory Visit | Attending: Adult Health | Admitting: Adult Health

## 2022-10-05 DIAGNOSIS — E2839 Other primary ovarian failure: Secondary | ICD-10-CM

## 2022-10-05 DIAGNOSIS — Z780 Asymptomatic menopausal state: Secondary | ICD-10-CM

## 2022-10-05 MED ORDER — CALCIUM CARBONATE 1500 (600 CA) MG PO TABS: 600.0000 mg | ORAL_TABLET | Freq: Two times a day (BID) | ORAL | 0 refills | Status: DC

## 2022-10-05 NOTE — Telephone Encounter (Signed)
Please let patient know that her bone density is showing very mild osteopenia.  She should be encouraged to perform weight bearing exercise 5 x a week  Continue Vitamin D as ordered Add a calcium supplement 600 mg twice daily Will repeat test in two years.

## 2022-10-06 NOTE — Telephone Encounter (Signed)
Discussed results with patient, patient verbalized understanding of results

## 2022-10-07 DIAGNOSIS — M8580 Other specified disorders of bone density and structure, unspecified site: Secondary | ICD-10-CM

## 2022-10-07 HISTORY — DX: Other specified disorders of bone density and structure, unspecified site: M85.80

## 2022-10-26 ENCOUNTER — Encounter (INDEPENDENT_AMBULATORY_CARE_PROVIDER_SITE_OTHER): Payer: BLUE CROSS/BLUE SHIELD | Admitting: Ophthalmology

## 2022-10-26 ENCOUNTER — Encounter: Payer: Self-pay | Admitting: Gastroenterology

## 2022-10-26 DIAGNOSIS — H33103 Unspecified retinoschisis, bilateral: Secondary | ICD-10-CM | POA: Diagnosis not present

## 2022-10-26 DIAGNOSIS — H35033 Hypertensive retinopathy, bilateral: Secondary | ICD-10-CM

## 2022-10-26 DIAGNOSIS — H43813 Vitreous degeneration, bilateral: Secondary | ICD-10-CM

## 2022-10-26 DIAGNOSIS — I10 Essential (primary) hypertension: Secondary | ICD-10-CM

## 2022-10-29 DIAGNOSIS — H26493 Other secondary cataract, bilateral: Secondary | ICD-10-CM | POA: Diagnosis not present

## 2022-10-29 DIAGNOSIS — H43392 Other vitreous opacities, left eye: Secondary | ICD-10-CM | POA: Diagnosis not present

## 2022-10-29 DIAGNOSIS — Z961 Presence of intraocular lens: Secondary | ICD-10-CM | POA: Diagnosis not present

## 2022-10-29 DIAGNOSIS — H33102 Unspecified retinoschisis, left eye: Secondary | ICD-10-CM | POA: Diagnosis not present

## 2022-11-17 DIAGNOSIS — M5451 Vertebrogenic low back pain: Secondary | ICD-10-CM | POA: Diagnosis not present

## 2022-12-04 ENCOUNTER — Encounter: Payer: Self-pay | Admitting: Internal Medicine

## 2022-12-04 ENCOUNTER — Other Ambulatory Visit: Payer: Self-pay

## 2022-12-04 DIAGNOSIS — C50812 Malignant neoplasm of overlapping sites of left female breast: Secondary | ICD-10-CM

## 2022-12-06 ENCOUNTER — Other Ambulatory Visit: Payer: Self-pay | Admitting: Internal Medicine

## 2022-12-06 DIAGNOSIS — I10 Essential (primary) hypertension: Secondary | ICD-10-CM

## 2022-12-06 NOTE — Progress Notes (Signed)
Patient Care Team: Victoria Gammon, MD as PCP - General (Internal Medicine) Victoria Samples, NP as Nurse Practitioner (Hematology and Oncology)   CHIEF COMPLAINT: Follow up left breast cancer   Oncology History Overview Note  Cancer Staging Cancer of overlapping sites of left breast [facility]) Staging form: Breast, AJCC 8th Edition - Clinical stage from 09/27/2018: Stage IA (cT1b, cN0, cM0, G2, ER+, PR+, HER2-) - Signed by Malachy Mood, MD on 12/07/2019 - Pathologic stage from 02/02/2019: Stage IA (pT1b, pN0, cM0, G2, ER+, PR+, HER2-) - Signed by Malachy Mood, MD on 12/07/2019     Cancer of overlapping sites of left breast  09/27/2018 Cancer Staging   Staging form: Breast, AJCC 8th Edition - Clinical stage from 09/27/2018: Stage IA (cT1b, cN0, cM0, G2, ER+, PR+, HER2-) - Signed by Malachy Mood, MD on 12/07/2019   10/04/2018 Mammogram   10/04/18 B/l mammogram - multifocal malignancy left breast with one prominent axillary node. Lesions are seperated by at least 3cm, but additional disease is not excluded indeterminate right enhancement-additional spot mammographic imaging and [place] advised to locate for sampling. Birads 6.   10/07/2018 Mammogram   10/07/18 - mammogram suspicious of malignancy. the nodule in the right breast middle depth lateral region seen on the craniocausal view only is at alow suspicion for malignancy. a stereotactic biopsy is reocmnended. Birads 4a low suspicions for malignancy    10/07/2018 Breast US   10/07/18 B/L [place] - known biopsy proven malignancy. The mass in the lift breast at 11: 00 positoin psterior depth is a known biopsy positive for malignancy. Birads 6 known malignancy    10/18/2018 Initial Diagnosis   Bilateral malignant neoplasm of breast in female [facility])   10/19/2018 Initial Biopsy   09/27/2018 Left breast biopsy  -11:00. 5cm from nipple: invasive ductal carcinoma, ER96%+, PR 98%+, HER2-, Ki67 25% -Left breast, upper lesion: invasive ductal carcinoma, ER96%+, PR 98%+, HER2-,  Ki67 20% 10/19/18 Right breast core biopsy -Bening fibroadipose breast tissue with focal usual ductal hyperplasia.     2020 Genetic Testing   Genetc testing negative    11/2018 - 01/2019 Neo-Adjuvant Anti-estrogen oral therapy   Neoadjuvant Anastrozole once daily for 6-8 weeks and had great response to treatment. Did not tolerate well.    02/02/2019 Surgery   B/l total mastectomy with Sentinel Node Biopsy by Dr. Rosaura Carpenter 12/08/18 PATHOLOGY: 2 left axillary sentinel lymph node were negative for malignant cells. Left breast mastectomy: Invasive and in situ ductal carcinoma, 66mm, associated with metal clip and prior biopsy site change, at the 11:00-12:00 position; a second metal clip is present at the 8:00 to 9 o'clock position, no evidence of malignancy Right breast mastectomy: Benign breast tissue   02/02/2019 Cancer Staging   Staging form: Breast, AJCC 8th Edition - Pathologic stage from 02/02/2019: Stage IA (pT1b, pN0, cM0, G2, ER+, PR+, HER2-) - Signed by Malachy Mood, MD on 12/07/2019   02/2019 -  Anti-estrogen oral therapy   Letrozole once daily for 2 months since the end of 02/2019-04/2019. Did not tolerate well and was switched to Exemestane once daily in 04/2019. Switched to Tamoxifen in 05/2020 due to worsened joint pain.    06/30/2019 Surgery   B/l Breast Reconstruction with Dr. Reggie Pile 06/30/19   10/17/2019 Imaging   DEXA ASSESSMENT: The BMD measured at Femur Neck Left is 0.982 g/cm2 with a T-score of -0.4. This patient is considered normal according to World Health Organization [facility]) criteria.      CURRENT THERAPY: Anti-estrogen therapy,  started 02/2019; most recently on Tamoxifen since 05/2020- stopped 11/2021 due to joint pain and hot flashes  INTERVAL HISTORY [name] returns for follow up, last seen by Dr. Mosetta Putt 12/04/21. DEXA 09/2022 showed mild osteopenia and low frax score. Since the fall she had PNA, foot surgery, and covid. She was not able to exercise then and gained weight, but  has recovered and resumed exercise. Also taking vit D and added calcium.  Denies concerns in her breasts/chest wall or axilla.  ROS  All other systems reviewed and negative  Past Medical History:  Diagnosis Date   Arthritis    Per [facility] New Patient Packet    Bilateral malignant neoplasm of breast in female [facility]) 10/18/2018   Breast cancer [facility])    Per [facility] New Patient Packet    Essential hypertension 10/25/2015   High blood pressure    Per [facility] New Patient Packet    High cholesterol    Per [facility] New Patient Packet    Hyperlipidemia LDL goal <130 10/25/2015   Pneumonia    Prediabetes 11/19/2017   Shingles      Past Surgical History:  Procedure Laterality Date   CATARACT EXTRACTION  2018   Per [facility] New Patient Packet, Dr.Todd Leventhal    CESAREAN SECTION     x2   COLONOSCOPY  2018   Per [facility] new patient packet, Dr.Locker   MASTECTOMY Bilateral 02/02/2019   Per [facility] New Patient Packet, DrMichele Blackwood and Dr.Colon    MENISCECTOMY Right 2009   MENISCUS REPAIR  2004   Per [facility] New Patient Packet, Dr. Karolee Stamps   TOENAIL TRIMMING Left    ingrown in big toe   TONSILLECTOMY  1951   Per [facility] New Patient Packet     Outpatient Encounter Medications as of 12/07/2022  Medication Sig   amLODipine (NORVASC) 2.5 MG tablet Take 1 tablet (2.5 mg total) by mouth daily.   BIOTIN 5000 PO Take 1 capsule by mouth daily.   calcium carbonate (OSCAL) 1500 (600 Ca) MG TABS tablet Take 1 tablet (1,500 mg total) by mouth 2 (two) times daily with a meal.   candesartan-hydrochlorothiazide (ATACAND HCT) 32-12.5 MG tablet TAKE 1 TABLET BY MOUTH EVERY DAY   Cholecalciferol (VITAMIN D3) 50 MCG (2000 UT) capsule Take 2,000 Units by mouth daily.   Cyanocobalamin (VITAMIN B-12) 2500 MCG SUBL Take 1 tablet by mouth daily.   esomeprazole (NEXIUM) 20 MG capsule Take 20 mg by mouth daily at 12 noon.   meloxicam (MOBIC) 7.5 MG tablet Take 7.5 mg by mouth daily as needed.   Polyethyl Glycol-Propyl Glycol (SYSTANE)  0.4-0.3 % SOLN Apply to eye daily.   psyllium (METAMUCIL) 58.6 % packet Take 1 packet by mouth daily.   rosuvastatin (CRESTOR) 20 MG tablet TAKE 1 TABLET BY MOUTH EVERY DAY   Turmeric (QC TUMERIC COMPLEX) 500 MG CAPS Take 500 mg by mouth daily.   vitamin C (ASCORBIC ACID) 500 MG tablet Take 500 mg by mouth daily.   [DISCONTINUED] nystatin (MYCOSTATIN/NYSTOP) powder Apply 1 Application topically 3 (three) times daily.   No facility-administered encounter medications on file as of 12/07/2022.     Today's Vitals   12/07/22 1029 12/07/22 1034  BP: (!) 155/85   Pulse: 79   Resp: 15   Temp: 98.3 F (36.8 C)   TempSrc: Oral   SpO2: 97%   Weight: 185 lb 6.4 oz (84.1 kg)   PainSc:  0-No pain   Body mass index is 29.48 kg/m.  PHYSICAL EXAM GENERAL:alert, no distress and comfortable SKIN: no rash  EYES: sclera clear NECK: without mass LYMPH:  no palpable cervical or supraclavicular lymphadenopathy  LUNGS: normal breathing effort HEART:  no lower extremity edema ABDOMEN: abdomen soft, non-tender and normal bowel sounds NEURO: alert & oriented x 3 with fluent speech, no focal motor/sensory deficits Breast exam: S/p bilateral mastectomy and reconstruction, incisions completely healed.  No palpable mass or nodularity in either reconstructed breast, chest wall, or axilla that I could appreciate.   CBC    Component Value Date/Time   WBC 5.7 12/07/2022 0957   WBC 10.5 05/14/2022 1327   RBC 4.07 12/07/2022 0957   HGB 13.5 12/07/2022 0957   HCT 38.4 12/07/2022 0957   PLT 198 12/07/2022 0957   MCV 94.3 12/07/2022 0957   MCH 33.2 12/07/2022 0957   MCHC 35.2 12/07/2022 0957   RDW 12.5 12/07/2022 0957   LYMPHSABS 1.7 12/07/2022 0957   MONOABS 0.4 12/07/2022 0957   EOSABS 0.1 12/07/2022 0957   BASOSABS 0.1 12/07/2022 0957     CMP     Component Value Date/Time   NA 139 12/07/2022 0957   NA 141 09/24/2022 0000   K 3.9 12/07/2022 0957   CL 104 12/07/2022 0957   CO2 25 12/07/2022  0957   GLUCOSE 153 (H) 12/07/2022 0957   BUN 16 12/07/2022 0957   BUN 15 09/24/2022 0000   CREATININE 0.74 12/07/2022 0957   CREATININE 0.67 11/04/2021 1446   CALCIUM 9.9 12/07/2022 0957   PROT 7.5 12/07/2022 0957   ALBUMIN 4.5 12/07/2022 0957   AST 45 (H) 12/07/2022 0957   ALT 55 (H) 12/07/2022 0957   ALKPHOS 67 12/07/2022 0957   BILITOT 0.7 12/07/2022 0957   GFRNONAA >60 12/07/2022 0957     ASSESSMENT & PLAN:Victoria Wolfe is a 77 y.o. post-menopausal female with    1. Left multifocal breast Cancer, stage IA (mpT1bN0), ER+/PR+/HER2- -diagnosed in treated in [place]. S/p neoadjuvant anastrozole for 6-8 weeks, b/l mastectomy and reconstruction with implants.  -She has been on adjuvant AI since 02/2019. She has tried all four standard antiestrogen therapy options but did not tolerate due to joint pain and hot flashes. She discontinued 11/2021 and proceeded with surveillance alone -No role for mammograms after b/l mastectomy -Ms. Renato Gails is clinically doing well.  Exam is benign, labs are stable, no clinical concern for recurrence -Follow-up with me in 1 year, or sooner if needed   2. Comorbidities: HTN, Arthritis with right knee pain, HLD, transaminitis  -Per PCP and Ortho -Intermittently elevated AST/ALT, work up showed hepatic steatosis -She had pneumonia, COVID, and foot surgery last year, she has recovered well   3. Bone Health, Cancer Screenings -Her 09/2019 DEXA was normal (-0.4 T-score).  DEXA 10/05/2022 showed mild osteopenia T-score -1.1.  She was told to take calcium, vitamin D, and weightbearing exercise -last colonoscopy in 02/2018, recall 02/2023 per GI -Continue healthy active lifestyle    PLAN: -Recent DEXA and today's labs reviewed -Continue breast cancer surveillance -Continue PCP, Ortho, and GI follow-up as indicated -Follow-up with me in 1 year (no lab) or sooner if needed    All questions were answered. The patient knows to call the clinic with any problems,  questions or concerns. No barriers to learning were detected.    Santiago Glad, NP-C 12/07/2022

## 2022-12-07 ENCOUNTER — Inpatient Hospital Stay: Payer: BLUE CROSS/BLUE SHIELD | Attending: Nurse Practitioner

## 2022-12-07 ENCOUNTER — Inpatient Hospital Stay (HOSPITAL_BASED_OUTPATIENT_CLINIC_OR_DEPARTMENT_OTHER): Payer: BLUE CROSS/BLUE SHIELD | Admitting: Nurse Practitioner

## 2022-12-07 ENCOUNTER — Encounter: Payer: Self-pay | Admitting: Nurse Practitioner

## 2022-12-07 VITALS — BP 155/85 | HR 79 | Temp 98.3°F | Resp 15 | Wt 185.4 lb

## 2022-12-07 DIAGNOSIS — M8580 Other specified disorders of bone density and structure, unspecified site: Secondary | ICD-10-CM | POA: Diagnosis not present

## 2022-12-07 DIAGNOSIS — Z79899 Other long term (current) drug therapy: Secondary | ICD-10-CM | POA: Insufficient documentation

## 2022-12-07 DIAGNOSIS — M1711 Unilateral primary osteoarthritis, right knee: Secondary | ICD-10-CM | POA: Insufficient documentation

## 2022-12-07 DIAGNOSIS — K760 Fatty (change of) liver, not elsewhere classified: Secondary | ICD-10-CM | POA: Diagnosis not present

## 2022-12-07 DIAGNOSIS — E785 Hyperlipidemia, unspecified: Secondary | ICD-10-CM | POA: Insufficient documentation

## 2022-12-07 DIAGNOSIS — E7800 Pure hypercholesterolemia, unspecified: Secondary | ICD-10-CM | POA: Diagnosis not present

## 2022-12-07 DIAGNOSIS — I10 Essential (primary) hypertension: Secondary | ICD-10-CM | POA: Diagnosis not present

## 2022-12-07 DIAGNOSIS — Z170 Estrogen receptor positive status [ER+]: Secondary | ICD-10-CM | POA: Insufficient documentation

## 2022-12-07 DIAGNOSIS — M2550 Pain in unspecified joint: Secondary | ICD-10-CM | POA: Diagnosis not present

## 2022-12-07 DIAGNOSIS — Z9013 Acquired absence of bilateral breasts and nipples: Secondary | ICD-10-CM | POA: Insufficient documentation

## 2022-12-07 DIAGNOSIS — Z79811 Long term (current) use of aromatase inhibitors: Secondary | ICD-10-CM | POA: Diagnosis not present

## 2022-12-07 DIAGNOSIS — R232 Flushing: Secondary | ICD-10-CM | POA: Diagnosis not present

## 2022-12-07 DIAGNOSIS — C50812 Malignant neoplasm of overlapping sites of left female breast: Secondary | ICD-10-CM | POA: Diagnosis not present

## 2022-12-07 LAB — CBC WITH DIFFERENTIAL (CANCER CENTER ONLY)
Abs Immature Granulocytes: 0.02 10*3/uL (ref 0.00–0.07)
Basophils Absolute: 0.1 10*3/uL (ref 0.0–0.1)
Basophils Relative: 1 %
Eosinophils Absolute: 0.1 10*3/uL (ref 0.0–0.5)
Eosinophils Relative: 2 %
HCT: 38.4 % (ref 36.0–46.0)
Hemoglobin: 13.5 g/dL (ref 12.0–15.0)
Immature Granulocytes: 0 %
Lymphocytes Relative: 29 %
Lymphs Abs: 1.7 10*3/uL (ref 0.7–4.0)
MCH: 33.2 pg (ref 26.0–34.0)
MCHC: 35.2 g/dL (ref 30.0–36.0)
MCV: 94.3 fL (ref 80.0–100.0)
Monocytes Absolute: 0.4 10*3/uL (ref 0.1–1.0)
Monocytes Relative: 7 %
Neutro Abs: 3.4 10*3/uL (ref 1.7–7.7)
Neutrophils Relative %: 61 %
Platelet Count: 198 10*3/uL (ref 150–400)
RBC: 4.07 MIL/uL (ref 3.87–5.11)
RDW: 12.5 % (ref 11.5–15.5)
WBC Count: 5.7 10*3/uL (ref 4.0–10.5)
nRBC: 0 % (ref 0.0–0.2)

## 2022-12-07 LAB — CMP (CANCER CENTER ONLY)
ALT: 55 U/L — ABNORMAL HIGH (ref 0–44)
AST: 45 U/L — ABNORMAL HIGH (ref 15–41)
Albumin: 4.5 g/dL (ref 3.5–5.0)
Alkaline Phosphatase: 67 U/L (ref 38–126)
Anion gap: 10 (ref 5–15)
BUN: 16 mg/dL (ref 8–23)
CO2: 25 mmol/L (ref 22–32)
Calcium: 9.9 mg/dL (ref 8.9–10.3)
Chloride: 104 mmol/L (ref 98–111)
Creatinine: 0.74 mg/dL (ref 0.44–1.00)
GFR, Estimated: >60 mL/min (ref >60)
Glucose, Bld: 153 mg/dL — ABNORMAL HIGH (ref 70–99)
Potassium: 3.9 mmol/L (ref 3.5–5.1)
Sodium: 139 mmol/L (ref 135–145)
Total Bilirubin: 0.7 mg/dL (ref 0.3–1.2)
Total Protein: 7.5 g/dL (ref 6.5–8.1)

## 2022-12-14 ENCOUNTER — Ambulatory Visit (INDEPENDENT_AMBULATORY_CARE_PROVIDER_SITE_OTHER): Payer: BLUE CROSS/BLUE SHIELD | Admitting: Gastroenterology

## 2022-12-14 ENCOUNTER — Encounter: Payer: Self-pay | Admitting: Gastroenterology

## 2022-12-14 VITALS — BP 124/82 | HR 51 | Ht 66.5 in

## 2022-12-14 DIAGNOSIS — R7989 Other specified abnormal findings of blood chemistry: Secondary | ICD-10-CM | POA: Diagnosis not present

## 2022-12-14 DIAGNOSIS — Z86010 Personal history of colonic polyps: Secondary | ICD-10-CM

## 2022-12-14 MED ORDER — NA SULFATE-K SULFATE-MG SULF 17.5-3.13-1.6 GM/177ML PO SOLN: 1.0000 | Freq: Once | ORAL | 0 refills | Status: AC

## 2022-12-14 NOTE — Progress Notes (Signed)
    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 The risks (including bleeding, perforation, infection, missed lesions, medication reactions and possible hospitalization or surgery if complications occur), benefits, and alternatives to colonoscopy with possible biopsy and possible polypectomy were discussed with the patient and they consent to proceed.   Trend LFTs approximately every 6 months Repeat abdominal ultrasound with elastography in April 2025 Optimize control of DM and hyperlipidemia Continue carb modified, fat modified weight loss diet    HPI    This is a 77 year old female with a personal history of adenomatous colon polyps and MASH.  She is a former patient of Dr. Orvan Falconer.  See Dr. Orvan Falconer 02/20/2022 office note for liver evaluation and recent LFTs below.  She returns today to discuss surveillance colonoscopy.  Colonoscopy in 2019 in [place] showed 1 adenomatous colon polyp.  Per prior discussions with Dr. Orvan Falconer a 5-year interval surveillance colonoscopy was planned which is now due.  She has no ongoing gastrointestinal complaints.   Labs / Imaging       Latest Ref Rng & Units 12/07/2022    9:57 AM 09/24/2022   12:00 AM 06/11/2022   12:00 AM  Hepatic Function  Total Protein 6.5 - 8.1 g/dL 7.5     Albumin 3.5 - 5.0 g/dL 4.5  4.3     4.3   AST 15 - 41 U/L 45  30     24   ALT 0 - 44 U/L 55  42     28   Alk Phosphatase 38 - 126 U/L 67  73     75   Total Bilirubin 0.3 - 1.2 mg/dL 0.7        This result is from an external source.       Latest Ref Rng & Units 12/07/2022    9:57 AM 09/24/2022   12:00 AM 05/14/2022    1:27 PM  CBC  WBC 4.0 - 10.5 K/uL 5.7  4.5     10.5   Hemoglobin 12.0 - 15.0 g/dL 40.9  81.1     91.4   Hematocrit 36.0 - 46.0 % 38.4  40     35.7   Platelets 150 - 400 K/uL 198  194     208      This result is from an  external source.    Current Medications, Allergies, Past Medical History, Past Surgical History, Family History and Social History were reviewed in [employer] record.   Physical Exam: General: Well developed, well nourished, no acute distress Head: Normocephalic and atraumatic Eyes: Sclerae anicteric, EOMI Ears: Normal auditory acuity Mouth: No deformities or lesions noted Lungs: Clear throughout to auscultation Heart: Regular rate and rhythm; No murmurs, rubs or bruits Abdomen: Soft, non tender and non distended. No masses, hepatosplenomegaly or hernias noted. Normal Bowel sounds Rectal: Deferred to colonoscopy  Musculoskeletal: Symmetrical with no gross deformities  Pulses:  Normal pulses noted Extremities: No edema or deformities noted Neurological: Alert oriented x 4, grossly nonfocal Psychological:  Alert and cooperative. Normal mood and affect   Jaiden Wahab T. Russella Dar, MD 12/14/2022, 9:18 AM

## 2022-12-14 NOTE — Patient Instructions (Signed)
You have been scheduled for a colonoscopy. Please follow written instructions given to you at your visit today.  Please pick up your prep supplies at the pharmacy within the next 1-3 days. If you use inhalers (even only as needed), please bring them with you on the day of your procedure.  The [facility] providers would like to encourage you to use MYCHART to communicate with providers for non-urgent requests or questions.  Due to long hold times on the telephone, sending your provider a message by MYCHART may be a faster and more efficient way to get a response.  Please allow 48 business hours for a response.  Please remember that this is for non-urgent requests.   Due to recent changes in healthcare laws, you may see the results of your imaging and laboratory studies on MyChart before your provider has had a chance to review them.  We understand that in some cases there may be results that are confusing or concerning to you. Not all laboratory results come back in the same time frame and the provider may be waiting for multiple results in order to interpret others.  Please give us 48 hours in order for your provider to thoroughly review all the results before contacting the office for clarification of your results.   Thank you for choosing me and [facility].  Malcolm T. Stark, Jr., MD., FACG  

## 2022-12-17 DIAGNOSIS — Z23 Encounter for immunization: Secondary | ICD-10-CM | POA: Diagnosis not present

## 2022-12-21 ENCOUNTER — Ambulatory Visit (AMBULATORY_SURGERY_CENTER): Payer: BLUE CROSS/BLUE SHIELD | Admitting: Gastroenterology

## 2022-12-21 ENCOUNTER — Encounter: Payer: Self-pay | Admitting: Gastroenterology

## 2022-12-21 VITALS — BP 142/68 | HR 77 | Temp 97.8°F | Resp 16 | Ht 66.0 in | Wt 180.0 lb

## 2022-12-21 DIAGNOSIS — Z86010 Personal history of colonic polyps: Secondary | ICD-10-CM

## 2022-12-21 DIAGNOSIS — Z1211 Encounter for screening for malignant neoplasm of colon: Secondary | ICD-10-CM | POA: Diagnosis not present

## 2022-12-21 DIAGNOSIS — Z09 Encounter for follow-up examination after completed treatment for conditions other than malignant neoplasm: Secondary | ICD-10-CM

## 2022-12-21 DIAGNOSIS — D123 Benign neoplasm of transverse colon: Secondary | ICD-10-CM

## 2022-12-21 DIAGNOSIS — D124 Benign neoplasm of descending colon: Secondary | ICD-10-CM

## 2022-12-21 MED ORDER — SODIUM CHLORIDE 0.9 % IV SOLN: 500.0000 mL | Freq: Once | INTRAVENOUS | Status: DC

## 2022-12-21 NOTE — Progress Notes (Signed)
Hypertensive during procedure; attempted BP readings on calf and bilateral upper extremities. Ended up using right upper arm. Pt responded to small bolus of Labetolol. Uneventful anesthetic. Report to pacu rn. Vss. Care resumed by rn.

## 2022-12-21 NOTE — Progress Notes (Signed)
See 12/14/2022 H&P, no changes

## 2022-12-21 NOTE — Progress Notes (Signed)
Called to room to assist during endoscopic procedure.  Patient ID and intended procedure confirmed with present staff. Received instructions for my participation in the procedure from the performing physician.  

## 2022-12-21 NOTE — Patient Instructions (Addendum)
-  Handout on polyps,high fiber diet, and diverticulosis provided -await pathology results -repeat colonoscopy for surveillance recommended. Date to be determined when pathology result become available   -Continue present medications   YOU HAD AN ENDOSCOPIC PROCEDURE TODAY AT THE [facility]:   Refer to the procedure report that was given to you for any specific questions about what was found during the examination.  If the procedure report does not answer your questions, please call your gastroenterologist to clarify.  If you requested that your care partner not be given the details of your procedure findings, then the procedure report has been included in a sealed envelope for you to review at your convenience later.  YOU SHOULD EXPECT: Some feelings of bloating in the abdomen. Passage of more gas than usual.  Walking can help get rid of the air that was put into your GI tract during the procedure and reduce the bloating. If you had a lower endoscopy (such as a colonoscopy or flexible sigmoidoscopy) you may notice spotting of blood in your stool or on the toilet paper. If you underwent a bowel prep for your procedure, you may not have a normal bowel movement for a few days.  Please Note:  You might notice some irritation and congestion in your nose or some drainage.  This is from the oxygen used during your procedure.  There is no need for concern and it should clear up in a day or so.  SYMPTOMS TO REPORT IMMEDIATELY:  Following lower endoscopy (colonoscopy or flexible sigmoidoscopy):  Excessive amounts of blood in the stool  Significant tenderness or worsening of abdominal pains  Swelling of the abdomen that is new, acute  Fever of 100F or higher  For urgent or emergent issues, a gastroenterologist can be reached at any hour by calling [phone]. Do not use MyChart messaging for urgent concerns.    DIET:  We do recommend a small meal at first, but then you may proceed to  your regular diet.  Drink plenty of fluids but you should avoid alcoholic beverages for 24 hours.  ACTIVITY:  You should plan to take it easy for the rest of today and you should NOT DRIVE or use heavy machinery until tomorrow (because of the sedation medicines used during the test).    FOLLOW UP: Our staff will call the number listed on your records the next business day following your procedure.  We will call around 7:15- 8:00 am to check on you and address any questions or concerns that you may have regarding the information given to you following your procedure. If we do not reach you, we will leave a message.     If any biopsies were taken you will be contacted by phone or by letter within the next 1-3 weeks.  Please call us at [phone] if you have not heard about the biopsies in 3 weeks.    SIGNATURES/CONFIDENTIALITY: You and/or your care partner have signed paperwork which will be entered into your electronic medical record.  These signatures attest to the fact that that the information above on your After Visit Summary has been reviewed and is understood.  Full responsibility of the confidentiality of this discharge information lies with you and/or your care-partner.

## 2022-12-21 NOTE — Op Note (Signed)
[facility] Patient Name: Victoria Wolfe Procedure Date: 12/21/2022 3:39 PM MRN: [id number] Endoscopist: Meryl Dare , MD, [phone] Age: 77 Referring MD:  Date of Birth: May 30, 1946 Gender: Female Account #: [id number] Procedure:                Colonoscopy Indications:              Surveillance: Personal history of adenomatous                            polyps on last colonoscopy 5 years ago Medicines:                Monitored Anesthesia Care Procedure:                Pre-Anesthesia Assessment:                           - Prior to the procedure, a History and Physical                            was performed, and patient medications and                            allergies were reviewed. The patient's tolerance of                            previous anesthesia was also reviewed. The risks                            and benefits of the procedure and the sedation                            options and risks were discussed with the patient.                            All questions were answered, and informed consent                            was obtained. Prior Anticoagulants: The patient has                            taken no anticoagulant or antiplatelet agents. ASA                            Grade Assessment: II - A patient with mild systemic                            disease. After reviewing the risks and benefits,                            the patient was deemed in satisfactory condition to                            undergo the procedure.  After obtaining informed consent, the colonoscope                            was passed under direct vision. Throughout the                            procedure, the patient's blood pressure, pulse, and                            oxygen saturations were monitored continuously. The                            CF [id number] #[id number] was introduced through the anus                            and advanced to the  the cecum, identified by                            appendiceal orifice and ileocecal valve. The                            ileocecal valve, appendiceal orifice, and rectum                            were photographed. The quality of the bowel                            preparation was adequate. The colonoscopy was                            performed without difficulty. The patient tolerated                            the procedure well. Scope In: 3:51:28 PM Scope Out: 4:09:47 PM Scope Withdrawal Time: 0 hours 12 minutes 26 seconds  Total Procedure Duration: 0 hours 18 minutes 19 seconds  Findings:                 The perianal and digital rectal examinations were                            normal.                           Two sessile polyps were found in the descending                            colon and transverse colon. The polyps were 5 to 7                            mm in size. These polyps were removed with a cold                            snare. Resection and retrieval were complete.  Multiple medium-mouthed and small-mouthed                            diverticula were found in the entire colon.                            Peri-diverticular erythema was seen. There was                            evidence of an impacted diverticulum. There was no                            evidence of diverticular bleeding.                           The exam was otherwise without abnormality on                            direct and retroflexion views. Complications:            No immediate complications. Estimated blood loss:                            None. Estimated Blood Loss:     Estimated blood loss: none. Impression:               - Two 5 to 7 mm polyps in the descending colon and                            in the transverse colon, removed with a cold snare.                            Resected and retrieved.                           - Moderate diverticulosis in  the entire examined                            colon.                           - The examination was otherwise normal on direct                            and retroflexion views. Recommendation:           - Repeat colonoscopy vs no repeat due to age after                            studies are complete for surveillance based on                            pathology results.                           - Patient has a contact number available for  emergencies. The signs and symptoms of potential                            delayed complications were discussed with the                            patient. Return to normal activities tomorrow.                            Written discharge instructions were provided to the                            patient.                           - High fiber diet.                           - Continue present medications.                           - Await pathology results. Meryl Dare, MD 12/21/2022 4:16:08 PM This report has been signed electronically.

## 2022-12-22 ENCOUNTER — Telehealth: Payer: Self-pay | Admitting: *Deleted

## 2022-12-22 NOTE — Telephone Encounter (Signed)
No answer on  follow up call. Left message.   

## 2023-01-04 ENCOUNTER — Encounter: Payer: Self-pay | Admitting: Internal Medicine

## 2023-01-04 ENCOUNTER — Encounter: Payer: Self-pay | Admitting: Gastroenterology

## 2023-01-05 ENCOUNTER — Telehealth: Payer: Self-pay

## 2023-01-05 DIAGNOSIS — B001 Herpesviral vesicular dermatitis: Secondary | ICD-10-CM

## 2023-01-05 NOTE — Telephone Encounter (Signed)
Patient called stating that she has cold sores on her bottom week that have been there for about 3 weeks and not getting better. She would like to know what to do. Please advise.  Message sent to Fletcher Anon, NP

## 2023-01-06 MED ORDER — ACYCLOVIR 400 MG PO TABS: 400.0000 mg | ORAL_TABLET | Freq: Three times a day (TID) | ORAL | 0 refills | Status: AC

## 2023-01-06 NOTE — Telephone Encounter (Signed)
Patient made aware and verbalized her understanding. 

## 2023-01-06 NOTE — Telephone Encounter (Signed)
This message says "bottom week".  I assume this means bottom lip. I will call in acyclovir. If  not improving she needs to be seen.

## 2023-01-10 ENCOUNTER — Encounter: Payer: Self-pay | Admitting: Gastroenterology

## 2023-01-12 ENCOUNTER — Other Ambulatory Visit: Payer: Self-pay | Admitting: Orthopedic Surgery

## 2023-01-12 DIAGNOSIS — E119 Type 2 diabetes mellitus without complications: Secondary | ICD-10-CM | POA: Diagnosis not present

## 2023-01-12 DIAGNOSIS — K1379 Other lesions of oral mucosa: Secondary | ICD-10-CM

## 2023-01-12 LAB — MICROALBUMIN, URINE: Microalb, Ur: 3

## 2023-01-12 LAB — HEMOGLOBIN A1C: Hemoglobin A1C: 7.1

## 2023-01-12 MED ORDER — NYSTATIN 100000 UNIT/ML MT SUSP
5.0000 mL | Freq: Three times a day (TID) | ORAL | 1 refills | Status: DC | PRN
Start: 2023-01-12 — End: 2023-09-20

## 2023-02-01 DIAGNOSIS — R2243 Localized swelling, mass and lump, lower limb, bilateral: Secondary | ICD-10-CM | POA: Diagnosis not present

## 2023-02-01 DIAGNOSIS — M1711 Unilateral primary osteoarthritis, right knee: Secondary | ICD-10-CM | POA: Diagnosis not present

## 2023-02-01 DIAGNOSIS — M5451 Vertebrogenic low back pain: Secondary | ICD-10-CM | POA: Diagnosis not present

## 2023-02-02 ENCOUNTER — Encounter: Payer: Medicare Other | Admitting: Internal Medicine

## 2023-02-02 ENCOUNTER — Encounter: Payer: Self-pay | Admitting: Internal Medicine

## 2023-02-02 ENCOUNTER — Non-Acute Institutional Stay: Payer: BLUE CROSS/BLUE SHIELD | Admitting: Internal Medicine

## 2023-02-02 VITALS — BP 136/80 | HR 74 | Temp 97.6°F | Resp 17 | Ht 66.0 in | Wt 185.2 lb

## 2023-02-02 DIAGNOSIS — E538 Deficiency of other specified B group vitamins: Secondary | ICD-10-CM

## 2023-02-02 DIAGNOSIS — E559 Vitamin D deficiency, unspecified: Secondary | ICD-10-CM | POA: Diagnosis not present

## 2023-02-02 DIAGNOSIS — E785 Hyperlipidemia, unspecified: Secondary | ICD-10-CM | POA: Diagnosis not present

## 2023-02-02 DIAGNOSIS — I10 Essential (primary) hypertension: Secondary | ICD-10-CM | POA: Diagnosis not present

## 2023-02-02 DIAGNOSIS — G8929 Other chronic pain: Secondary | ICD-10-CM

## 2023-02-02 DIAGNOSIS — K760 Fatty (change of) liver, not elsewhere classified: Secondary | ICD-10-CM

## 2023-02-02 DIAGNOSIS — M545 Low back pain, unspecified: Secondary | ICD-10-CM | POA: Diagnosis not present

## 2023-02-02 DIAGNOSIS — E119 Type 2 diabetes mellitus without complications: Secondary | ICD-10-CM

## 2023-02-02 DIAGNOSIS — Z853 Personal history of malignant neoplasm of breast: Secondary | ICD-10-CM | POA: Diagnosis not present

## 2023-02-02 MED ORDER — METFORMIN HCL ER 500 MG PO TB24: 500.0000 mg | ORAL_TABLET | Freq: Every day | ORAL | 3 refills | Status: DC

## 2023-02-03 NOTE — Progress Notes (Unsigned)
Location:  [facility] features editor of Service:  Clinic (12)  Provider:   Code Status:  Goals of Care:     02/02/2023    1:37 PM  Advanced Directives  Does Patient Have a Medical Advance Directive? Yes  Type of Estate agent of [place];Living will;Out of facility DNR (pink MOST or yellow form)  Does patient want to make changes to medical advance directive? No - Patient declined     Chief Complaint  Patient presents with   Medical Management of Chronic Issues    4 month follow up with labs   Quality Metric Gaps    Discuss the need for AWV and hep c screening    HPI: Patient is a 77 y.o. female seen today for medical management of chronic diseases.   [place] in [place] Hypertension, prediabetes and diet-controlled, HLD, B12 deficiency   Elevated A1C Agreeable to try Meds Not able to much exercise because of her knee and back pain . Elevated LFTs.  Ultrasound showed NASH  Per GI Max control of Diabetes Avoid Alcohol  Annual screening  Bilateral mastectomy for breast cancer Not on tamoxifen any more due to side effects Follows with Dr Parke Poisson  Low back pain Sees Dr Shelle Iron  He has ordered ABI to Rule out PAD cause for pain in the legs and Knee Also s/p Right Knee injection   Past Medical History:  Diagnosis Date   Arthritis    Per [facility] New Patient Packet    Bilateral malignant neoplasm of breast in female [facility]) 10/18/2018   Breast cancer (HCC)    Per [facility] New Patient Packet    Cataract    Essential hypertension 10/25/2015   GERD (gastroesophageal reflux disease)    High blood pressure    Per [facility] New Patient Packet    High cholesterol    Per [facility] New Patient Packet    Hyperlipidemia LDL goal <130 10/25/2015   Pneumonia    Prediabetes 11/19/2017   Shingles     Past Surgical History:  Procedure Laterality Date   CATARACT EXTRACTION  2018   Per [facility] New Patient Packet, Dr.Todd Leventhal    CESAREAN SECTION     x2    COLONOSCOPY  2018   Per [facility] new patient packet, Dr.Locker   MASTECTOMY Bilateral 02/02/2019   Per [facility] New Patient Packet, DrMichele Blackwood and Dr.Colon    MENISCECTOMY Right 2009   MENISCUS REPAIR  2004   Per [facility] New Patient Packet, Dr. Karolee Stamps   TOENAIL TRIMMING Left    ingrown in big toe   TONSILLECTOMY  1951   Per [facility] New Patient Packet    Allergies  Allergen Reactions   Amoxicillin Rash    Had rash after taking amoxil and zithromax, not sure which one caused the rash   Zithromax [Azithromycin] Rash    Had rash after taking zithromax and amoxil at the same time, not sure which antibiotic caused the rash    Outpatient Encounter Medications as of 02/02/2023  Medication Sig   amLODipine (NORVASC) 2.5 MG tablet Take 1 tablet (2.5 mg total) by mouth daily.   BIOTIN 5000 PO Take 1 capsule by mouth daily.   calcium carbonate (OSCAL) 1500 (600 Ca) MG TABS tablet Take 1 tablet (1,500 mg total) by mouth 2 (two) times daily with a meal.   candesartan-hydrochlorothiazide (ATACAND HCT) 32-12.5 MG tablet TAKE 1 TABLET BY MOUTH EVERY DAY   Cholecalciferol (VITAMIN D3) 50 MCG (2000  UT) capsule Take 2,000 Units by mouth daily.   Cyanocobalamin (VITAMIN B-12) 2500 MCG SUBL Take 1 tablet by mouth daily.   esomeprazole (NEXIUM) 20 MG capsule Take 20 mg by mouth daily at 12 noon.   magic mouthwash (nystatin, lidocaine, diphenhydrAMINE, alum & mag hydroxide) suspension Swish and spit 5 mLs 3 (three) times daily as needed for mouth pain.   metFORMIN (GLUCOPHAGE-XR) 500 MG 24 hr tablet Take 1 tablet (500 mg total) by mouth daily with breakfast.   Polyethyl Glycol-Propyl Glycol (SYSTANE) 0.4-0.3 % SOLN Apply to eye daily.   psyllium (METAMUCIL) 58.6 % packet Take 1 packet by mouth daily.   rosuvastatin (CRESTOR) 20 MG tablet TAKE 1 TABLET BY MOUTH EVERY DAY   Turmeric (QC TUMERIC COMPLEX) 500 MG CAPS Take 500 mg by mouth daily.   vitamin C (ASCORBIC ACID) 500 MG tablet Take 500 mg by mouth daily.    [DISCONTINUED] meloxicam (MOBIC) 7.5 MG tablet Take 7.5 mg by mouth daily as needed. (Patient not taking: Reported on 12/14/2022)   No facility-administered encounter medications on file as of 02/02/2023.    Review of Systems:  Review of Systems  Constitutional:  Negative for activity change and appetite change.  HENT: Negative.    Respiratory:  Negative for cough and shortness of breath.   Cardiovascular:  Negative for leg swelling.  Gastrointestinal:  Negative for constipation.  Genitourinary: Negative.   Musculoskeletal:  Positive for arthralgias, back pain and myalgias. Negative for gait problem.  Skin: Negative.   Neurological:  Negative for dizziness and weakness.  Psychiatric/Behavioral:  Negative for confusion, dysphoric mood and sleep disturbance.     Health Maintenance  Topic Date Due   Medicare Annual Wellness (AWV)  Never done   OPHTHALMOLOGY EXAM  10/23/2022   Diabetic kidney evaluation - Urine ACR  02/03/2023 (Originally 05/12/1964)   COVID-19 Vaccine (8 - 2023-24 season) 02/11/2023   INFLUENZA VACCINE  03/25/2023   FOOT EXAM  06/17/2023   HEMOGLOBIN A1C  07/15/2023   Diabetic kidney evaluation - eGFR measurement  12/07/2023   DTaP/Tdap/Td (2 - Td or Tdap) 08/27/2024   Pneumonia Vaccine 76+ Years old  Completed   DEXA SCAN  Completed   Hepatitis C Screening  Completed   Zoster Vaccines- Shingrix  Completed   HPV VACCINES  Aged Out   Colonoscopy  Discontinued    Physical Exam: Vitals:   02/02/23 1335  BP: 136/80  Pulse: 74  Resp: 17  Temp: 97.6 F (36.4 C)  TempSrc: Temporal  SpO2: 97%  Weight: 185 lb 3.2 oz (84 kg)  Height: 5\' 6"  (1.676 m)   Body mass index is 29.89 kg/m. Physical Exam Vitals reviewed.  Constitutional:      Appearance: Normal appearance.  HENT:     Head: Normocephalic.     Nose: Nose normal.     Mouth/Throat:     Mouth: Mucous membranes are moist.     Pharynx: Oropharynx is clear.  Eyes:     Pupils: Pupils are equal, round,  and reactive to light.  Cardiovascular:     Rate and Rhythm: Normal rate and regular rhythm.     Pulses: Normal pulses.     Heart sounds: Normal heart sounds. No murmur heard. Pulmonary:     Effort: Pulmonary effort is normal.     Breath sounds: Normal breath sounds.  Abdominal:     General: Abdomen is flat. Bowel sounds are normal.     Palpations: Abdomen is soft.  Musculoskeletal:  General: No swelling.     Cervical back: Neck supple.  Skin:    General: Skin is warm.  Neurological:     General: No focal deficit present.     Mental Status: She is alert and oriented to person, place, and time.  Psychiatric:        Mood and Affect: Mood normal.        Thought Content: Thought content normal.     Labs reviewed: Basic Metabolic Panel: Recent Labs    05/14/22 1327 06/11/22 0000 09/24/22 0000 12/07/22 0957  NA 137 142 141 139  K 3.5 4.1 4.2 3.9  CL 102 104 105 104  CO2 26 26* 22 25  GLUCOSE 159*  --   --  153*  BUN 17 18 15 16   CREATININE 0.68 0.7 0.6 0.74  CALCIUM 9.8 9.8 9.9 9.9   Liver Function Tests: Recent Labs    06/11/22 0000 09/24/22 0000 12/07/22 0957  AST 24 30 45*  ALT 28 42* 55*  ALKPHOS 75 73 67  BILITOT  --   --  0.7  PROT  --   --  7.5  ALBUMIN 4.3 4.3 4.5   No results for input(s): "LIPASE", "AMYLASE" in the last 8760 hours. No results for input(s): "AMMONIA" in the last 8760 hours. CBC: Recent Labs    05/14/22 1327 09/24/22 0000 12/07/22 0957  WBC 10.5 4.5 5.7  NEUTROABS  --   --  3.4  HGB 12.6 13.6 13.5  HCT 35.7* 40 38.4  MCV 92.7  --  94.3  PLT 208 194 198   Lipid Panel: Recent Labs    09/24/22 0000  CHOL 178  HDL 58  LDLCALC 91  TRIG 143   Lab Results  Component Value Date   HGBA1C 7.1 01/12/2023    Procedures since last visit: No results found.  Assessment/Plan 1. Type 2 diabetes mellitus without complication, without long-term current use of insulin (HCC) Start Metformin XL 500 mg  Follow up in 3  months  2. Essential hypertension Doing well on Norvasc and Atacand  3. Hyperlipidemia LDL goal <100 Crestor  4. Hepatic steatosis Stay Off Alcohol and don't take more then 2 gram of Tylenol Discussed again today with patient  5. B12 deficiency Reduce the dose to 1000 mcg  6. Hx of breast cancer Follows with Oncology  7. Chronic midline low back pain without sciatica Seeing Ortho Limit Tylenol uses especially with Alcohol Plans to get it injected  8. Vitamin D deficiency On Supplement    Labs/tests ordered:  B 12 level, A1C, Liver panel Next appt:  05/10/2023

## 2023-02-10 DIAGNOSIS — L821 Other seborrheic keratosis: Secondary | ICD-10-CM | POA: Diagnosis not present

## 2023-02-10 DIAGNOSIS — L728 Other follicular cysts of the skin and subcutaneous tissue: Secondary | ICD-10-CM | POA: Diagnosis not present

## 2023-02-10 DIAGNOSIS — L814 Other melanin hyperpigmentation: Secondary | ICD-10-CM | POA: Diagnosis not present

## 2023-02-10 DIAGNOSIS — D1801 Hemangioma of skin and subcutaneous tissue: Secondary | ICD-10-CM | POA: Diagnosis not present

## 2023-03-05 ENCOUNTER — Other Ambulatory Visit: Payer: Self-pay | Admitting: Internal Medicine

## 2023-03-10 ENCOUNTER — Other Ambulatory Visit: Payer: Self-pay

## 2023-03-10 DIAGNOSIS — M1711 Unilateral primary osteoarthritis, right knee: Secondary | ICD-10-CM | POA: Diagnosis not present

## 2023-03-17 DIAGNOSIS — M1711 Unilateral primary osteoarthritis, right knee: Secondary | ICD-10-CM | POA: Diagnosis not present

## 2023-03-24 DIAGNOSIS — M25561 Pain in right knee: Secondary | ICD-10-CM | POA: Diagnosis not present

## 2023-03-24 DIAGNOSIS — M1711 Unilateral primary osteoarthritis, right knee: Secondary | ICD-10-CM | POA: Diagnosis not present

## 2023-03-25 ENCOUNTER — Other Ambulatory Visit (HOSPITAL_COMMUNITY): Payer: Self-pay | Admitting: Specialist

## 2023-03-25 DIAGNOSIS — I739 Peripheral vascular disease, unspecified: Secondary | ICD-10-CM

## 2023-03-26 ENCOUNTER — Other Ambulatory Visit (HOSPITAL_COMMUNITY): Payer: Self-pay | Admitting: Specialist

## 2023-03-26 ENCOUNTER — Ambulatory Visit (HOSPITAL_COMMUNITY)
Admission: RE | Admit: 2023-03-26 | Discharge: 2023-03-26 | Disposition: A | Discharge status: Home / Self Care | Payer: BLUE CROSS/BLUE SHIELD | Source: Ambulatory Visit | Attending: Cardiovascular Disease | Admitting: Cardiovascular Disease

## 2023-03-26 DIAGNOSIS — I739 Peripheral vascular disease, unspecified: Secondary | ICD-10-CM

## 2023-03-26 LAB — VAS US ABI WITH/WO TBI
Left ABI: 0.77
Right ABI: 0.88

## 2023-04-12 ENCOUNTER — Ambulatory Visit (HOSPITAL_COMMUNITY)
Admission: RE | Admit: 2023-04-12 | Discharge: 2023-04-12 | Disposition: A | Discharge status: Home / Self Care | Payer: BLUE CROSS/BLUE SHIELD | Source: Ambulatory Visit | Attending: Cardiology | Admitting: Cardiology

## 2023-04-12 ENCOUNTER — Ambulatory Visit (HOSPITAL_BASED_OUTPATIENT_CLINIC_OR_DEPARTMENT_OTHER)
Admission: RE | Admit: 2023-04-12 | Discharge: 2023-04-12 | Disposition: A | Discharge status: Home / Self Care | Payer: BLUE CROSS/BLUE SHIELD | Source: Ambulatory Visit | Attending: Internal Medicine | Admitting: Internal Medicine

## 2023-04-12 DIAGNOSIS — I739 Peripheral vascular disease, unspecified: Secondary | ICD-10-CM | POA: Diagnosis not present

## 2023-04-13 ENCOUNTER — Encounter: Payer: Self-pay | Admitting: Internal Medicine

## 2023-04-13 NOTE — Telephone Encounter (Signed)
Message routed to PCP Gupta, Anjali L, MD  

## 2023-04-14 ENCOUNTER — Ambulatory Visit (INDEPENDENT_AMBULATORY_CARE_PROVIDER_SITE_OTHER): Payer: BLUE CROSS/BLUE SHIELD | Admitting: Vascular Surgery

## 2023-04-14 ENCOUNTER — Encounter: Payer: Self-pay | Admitting: Vascular Surgery

## 2023-04-14 VITALS — BP 160/71 | HR 73 | Temp 97.7°F | Resp 20 | Ht 66.0 in | Wt 182.0 lb

## 2023-04-14 DIAGNOSIS — I70212 Atherosclerosis of native arteries of extremities with intermittent claudication, left leg: Secondary | ICD-10-CM | POA: Diagnosis not present

## 2023-04-14 NOTE — Progress Notes (Signed)
Patient ID: Victoria Wolfe, female   DOB: 1945-10-12, 77 y.o.   MRN: [id number]  Reason for Consult: New Patient (Initial Visit)   Referred by Jene Every, MD  Subjective:     HPI:  Victoria Wolfe is a 77 y.o. female former smoker.  She was evaluated for back pain but there was concern for arterial insufficiency with left lower extremity claudication particularly in the left thigh.  She states she can probably walk approximately 0.1 miles prior to stopping.  Pain fully subsides when she stops or stands still.  She does not have tissue loss or ulceration.  No previous vascular intervention.  Past Medical History:  Diagnosis Date   Arthritis    Per [facility] New Patient Packet    Bilateral malignant neoplasm of breast in female [facility]) 10/18/2018   Breast cancer [facility])    Per [facility] New Patient Packet    Cataract    Essential hypertension 10/25/2015   GERD (gastroesophageal reflux disease)    High blood pressure    Per [facility] New Patient Packet    High cholesterol    Per [facility] New Patient Packet    Hyperlipidemia LDL goal <130 10/25/2015   Pneumonia    Prediabetes 11/19/2017   Shingles    Family History  Problem Relation Age of Onset   Alzheimer's disease Mother    Colon cancer Mother 51   Ulcers Father    Parkinsonism Father    Osteoporosis Sister    Stomach cancer Neg Hx    Past Surgical History:  Procedure Laterality Date   CATARACT EXTRACTION  2018   Per [facility] New Patient Packet, Dr.Todd Leventhal    CESAREAN SECTION     x2   COLONOSCOPY  2018   Per [facility] new patient packet, Dr.Locker   MASTECTOMY Bilateral 02/02/2019   Per [facility] New Patient Packet, DrMichele Blackwood and Dr.Colon    MENISCECTOMY Right 2009   MENISCUS REPAIR  2004   Per [facility] New Patient Packet, Dr. Karolee Stamps   TOENAIL TRIMMING Left    ingrown in big toe   TONSILLECTOMY  1951   Per [facility] New Patient Packet    Short Social History:  Social History   Tobacco Use   Smoking status: Former    Current packs/day: 0.00     Types: Cigarettes    Start date: 1970    Quit date: 1995    Years since quitting: 29.6   Smokeless tobacco: Never  Substance Use Topics   Alcohol use: Not Currently    Allergies  Allergen Reactions   Amoxicillin Rash    Had rash after taking amoxil and zithromax, not sure which one caused the rash   Zithromax [Azithromycin] Rash    Had rash after taking zithromax and amoxil at the same time, not sure which antibiotic caused the rash    Current Outpatient Medications  Medication Sig Dispense Refill   amLODipine (NORVASC) 2.5 MG tablet TAKE 1 TABLET BY MOUTH EVERY DAY 90 tablet 3   BIOTIN 5000 PO Take 1 capsule by mouth daily.     calcium carbonate (OSCAL) 1500 (600 Ca) MG TABS tablet Take 1 tablet (1,500 mg total) by mouth 2 (two) times daily with a meal. 60 tablet 0   candesartan-hydrochlorothiazide (ATACAND HCT) 32-12.5 MG tablet TAKE 1 TABLET BY MOUTH EVERY DAY 90 tablet 1   Cholecalciferol (VITAMIN D3) 50 MCG (2000 UT) capsule Take 2,000 Units by mouth daily.     Cyanocobalamin (VITAMIN B-12) 2500 MCG SUBL Take  1 tablet by mouth daily.     esomeprazole (NEXIUM) 20 MG capsule Take 20 mg by mouth daily at 12 noon.     magic mouthwash (nystatin, lidocaine, diphenhydrAMINE, alum & mag hydroxide) suspension Swish and spit 5 mLs 3 (three) times daily as needed for mouth pain. 180 mL 1   metFORMIN (GLUCOPHAGE-XR) 500 MG 24 hr tablet Take 1 tablet (500 mg total) by mouth daily with breakfast. 90 tablet 3   Polyethyl Glycol-Propyl Glycol (SYSTANE) 0.4-0.3 % SOLN Apply to eye daily.     psyllium (METAMUCIL) 58.6 % packet Take 1 packet by mouth daily.     rosuvastatin (CRESTOR) 20 MG tablet TAKE 1 TABLET BY MOUTH EVERY DAY 90 tablet 3   Turmeric (QC TUMERIC COMPLEX) 500 MG CAPS Take 500 mg by mouth daily.     vitamin C (ASCORBIC ACID) 500 MG tablet Take 500 mg by mouth daily.     No current facility-administered medications for this visit.    Review of Systems  Constitutional:   Constitutional negative. HENT: HENT negative.  Eyes: Eyes negative.  Respiratory: Respiratory negative.  Cardiovascular: Positive for claudication.  GI: Gastrointestinal negative.  Musculoskeletal: Musculoskeletal negative.  Skin: Skin negative.  Neurological: Neurological negative. Hematologic: Hematologic/lymphatic negative.  Psychiatric: Psychiatric negative.        Objective:  Objective   Vitals:   04/14/23 0923  BP: (!) 160/71  Pulse: 73  Resp: 20  Temp: 97.7 F (36.5 C)  SpO2: 96%  Weight: 182 lb (82.6 kg)  Height: 5\' 6"  (1.676 m)   Body mass index is 29.38 kg/m.  Physical Exam HENT:     Head: Normocephalic.  Eyes:     Pupils: Pupils are equal, round, and reactive to light.  Cardiovascular:     Pulses:          Femoral pulses are 0 on the right side and 0 on the left side.      Dorsalis pedis pulses are 2+ on the right side and 1+ on the left side.  Pulmonary:     Effort: Pulmonary effort is normal.  Abdominal:     General: Abdomen is flat.  Musculoskeletal:     Right lower leg: No edema.     Left lower leg: No edema.  Skin:    General: Skin is warm.     Capillary Refill: Capillary refill takes less than 2 seconds.  Neurological:     General: No focal deficit present.  Psychiatric:        Mood and Affect: Mood normal.        Thought Content: Thought content normal.     Data: RIGHT      PSV cm/sRatioStenosisWaveform Comments  +-----------+--------+-----+--------+---------+--------+  CFA Prox   115                  triphasic          +-----------+--------+-----+--------+---------+--------+  DFA       118                  triphasic          +-----------+--------+-----+--------+---------+--------+  SFA Prox   79                   biphasic           +-----------+--------+-----+--------+---------+--------+  SFA Mid    77                   biphasic           +-----------+--------+-----+--------+---------+--------+  SFA Distal 89                   biphasic           +-----------+--------+-----+--------+---------+--------+  POP Prox   58                   biphasic           +-----------+--------+-----+--------+---------+--------+  POP Mid    51                   biphasic           +-----------+--------+-----+--------+---------+--------+  POP Distal 59                   biphasic           +-----------+--------+-----+--------+---------+--------+  TP Trunk   66                   biphasic           +-----------+--------+-----+--------+---------+--------+  ATA Distal 79                   biphasic           +-----------+--------+-----+--------+---------+--------+  PTA Distal 35                   biphasic           +-----------+--------+-----+--------+---------+--------+  PERO Distal64                   biphasic           +-----------+--------+-----+--------+---------+--------+        +-----------+--------+-----+--------+----------+-------------+  LEFT      PSV cm/sRatioStenosisWaveform  Comments       +-----------+--------+-----+--------+----------+-------------+  CFA Prox   87                   triphasic dampened flow  +-----------+--------+-----+--------+----------+-------------+  DFA       48                   monophasic               +-----------+--------+-----+--------+----------+-------------+  SFA Prox   57                   triphasic                +-----------+--------+-----+--------+----------+-------------+  SFA Mid    43                   biphasic                 +-----------+--------+-----+--------+----------+-------------+  SFA Distal 39                   monophasic               +-----------+--------+-----+--------+----------+-------------+  POP Prox   23                   monophasic               +-----------+--------+-----+--------+----------+-------------+  POP Mid    28                    monophasic               +-----------+--------+-----+--------+----------+-------------+  POP Distal 37  biphasic                 +-----------+--------+-----+--------+----------+-------------+  TP Trunk   37                   biphasic                 +-----------+--------+-----+--------+----------+-------------+  ATA Distal 21                   monophasic               +-----------+--------+-----+--------+----------+-------------+  PTA Distal 30                   biphasic                 +-----------+--------+-----+--------+----------+-------------+  PERO Distal16                   biphasic                 +-----------+--------+-----+--------+----------+-------------+        Summary:  Right: No evidence of stenosis seen. Athereosclerosis noted throughout.   Left: No evidence of stenosis seen. Atherosclerosis noted throughout.   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    ABI Findings:  +---------+------------------+-----+---------+--------+  Right   Rt Pressure (mmHg)IndexWaveform Comment   +---------+------------------+-----+---------+--------+  Brachial 174                                       +---------+------------------+-----+---------+--------+  PTA     153               0.88 triphasic          +---------+------------------+-----+---------+--------+  DP      142               0.82 triphasic          +---------+------------------+-----+---------+--------+  Great Toe139               0.80 Normal             +---------+------------------+-----+---------+--------+   +---------+------------------+-----+-----------+-------+  Left    Lt Pressure (mmHg)IndexWaveform   Comment  +---------+------------------+-----+-----------+-------+  Brachial 168                                         +---------+------------------+-----+-----------+-------+  PTA     134               0.77 multiphasic         +---------+------------------+-----+-----------+-------+  DP      131               0.75 monophasic          +---------+------------------+-----+-----------+-------+  Great Toe110               0.63 Abnormal            +---------+------------------+-----+-----------+-------+   +-------+-----------+-----------+------------+------------+  ABI/TBIToday's ABIToday's TBIPrevious ABIPrevious TBI  +-------+-----------+-----------+------------+------------+  Right .88        .80                                  +-------+-----------+-----------+------------+------------+  Left  .77        .  63                                  +-------+-----------+-----------+------------+------------+       Summary:  Right: Resting right ankle-brachial index indicates mild right lower  extremity arterial disease. The right toe-brachial index is normal.   Left: Resting left ankle-brachial index indicates moderate left lower  extremity arterial disease. The left toe-brachial index is abnormal.    Abdominal Aorta Findings:  +-------------+-------+----------+----------+--------+--------+------------  ----+  Location    AP (cm)Trans (cm)PSV (cm/s)WaveformThrombusComments           +-------------+-------+----------+----------+--------+--------+------------  ----+  Proximal    2.00   2.50      42        biphasic                           +-------------+-------+----------+----------+--------+--------+------------  ----+  Mid         1.70   1.80      59        biphasic                           +-------------+-------+----------+----------+--------+--------+------------  ----+  Distal      1.80   1.90      49        biphasic                           +-------------+-------+----------+----------+--------+--------+------------  ----+   RT CIA Prox  1.0    1.0       181       biphasic                           +-------------+-------+----------+----------+--------+--------+------------  ----+  RT CIA Mid                    97        biphasic                           +-------------+-------+----------+----------+--------+--------+------------  ----+  RT CIA Distal                 78        biphasic                           +-------------+-------+----------+----------+--------+--------+------------  ----+  RT EIA Prox  1.4    1.4                                 not seen  due to                                                           overlying  bowel  gas                +-------------+-------+----------+----------+--------+--------+------------  ----+  RT EIA Mid                    103       biphasic                           +-------------+-------+----------+----------+--------+--------+------------  ----+  RT EIA Distal                 104       biphasic                           +-------------+-------+----------+----------+--------+--------+------------  ----+  LT CIA Prox  1.0    1.0       122       biphasic                           +-------------+-------+----------+----------+--------+--------+------------  ----+  LT CIA Mid                    66        biphasic        dampened                                                                   waveform           +-------------+-------+----------+----------+--------+--------+------------  ----+  LT CIA Distal                 702       biphasic                           +-------------+-------+----------+----------+--------+--------+------------  ----+  LT EIA Prox  1.4    1.4       240       biphasic                            +-------------+-------+----------+----------+--------+--------+------------  ----+  LT EIA Mid                    73        biphasic                           +-------------+-------+----------+----------+--------+--------+------------  ----+  LT EIA Distal                 69        biphasic                           +-------------+-------+----------+----------+--------+--------+------------  ----+   Visualization of the Right EIA Proximal artery was limited.       Summary:  Abdominal Aorta: No evidence of an abdominal aortic aneurysm was  visualized. The largest aortic measurement is 2.5 cm. Severe  atherosclerosis noted throughout aorta. 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   IVC/Iliac: There is no evidence of thrombus involving the IVC.      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  I have offered her continued walking therapy and follow-up in 6 months versus CT scan versus direct endovascular intervention.  Will plan for CT scan of the abdomen and pelvis with bilateral lower extremity runoff and follow-up with me afterwards or we can evaluate her vessels directly and discuss options moving forward.  I have asked her to begin taking 81 mg of aspirin daily along with the Crestor that she already takes.  All questions were answered she demonstrates very good understanding in the presence of her sister.     Maeola Harman MD [facility]

## 2023-04-20 ENCOUNTER — Encounter: Payer: Self-pay | Admitting: Vascular Surgery

## 2023-04-29 ENCOUNTER — Other Ambulatory Visit: Payer: Self-pay | Admitting: Internal Medicine

## 2023-04-29 DIAGNOSIS — I10 Essential (primary) hypertension: Secondary | ICD-10-CM

## 2023-04-29 DIAGNOSIS — E785 Hyperlipidemia, unspecified: Secondary | ICD-10-CM

## 2023-04-30 ENCOUNTER — Other Ambulatory Visit: Payer: Self-pay

## 2023-04-30 DIAGNOSIS — I70212 Atherosclerosis of native arteries of extremities with intermittent claudication, left leg: Secondary | ICD-10-CM

## 2023-05-04 DIAGNOSIS — K762 Central hemorrhagic necrosis of liver: Secondary | ICD-10-CM | POA: Diagnosis not present

## 2023-05-04 DIAGNOSIS — D510 Vitamin B12 deficiency anemia due to intrinsic factor deficiency: Secondary | ICD-10-CM | POA: Diagnosis not present

## 2023-05-04 DIAGNOSIS — E119 Type 2 diabetes mellitus without complications: Secondary | ICD-10-CM | POA: Diagnosis not present

## 2023-05-04 LAB — HEPATIC FUNCTION PANEL
ALT: 65 U/L — AB (ref 7–35)
AST: 55 — AB (ref 13–35)
Alkaline Phosphatase: 94 (ref 25–125)
Bilirubin, Total: 0.5

## 2023-05-04 LAB — HEMOGLOBIN A1C: Hemoglobin A1C: 7.4

## 2023-05-04 LAB — COMPREHENSIVE METABOLIC PANEL: Albumin: 4.5 (ref 3.5–5.0)

## 2023-05-04 LAB — VITAMIN B12: Vitamin B-12: 827

## 2023-05-10 ENCOUNTER — Non-Acute Institutional Stay (INDEPENDENT_AMBULATORY_CARE_PROVIDER_SITE_OTHER): Payer: BLUE CROSS/BLUE SHIELD | Admitting: Adult Health

## 2023-05-10 ENCOUNTER — Encounter: Payer: Self-pay | Admitting: Adult Health

## 2023-05-10 VITALS — BP 150/90 | HR 78 | Temp 97.7°F | Resp 17 | Ht 66.0 in | Wt 183.6 lb

## 2023-05-10 DIAGNOSIS — Z0000 Encounter for general adult medical examination without abnormal findings: Secondary | ICD-10-CM

## 2023-05-10 NOTE — Patient Instructions (Addendum)
[name] , Thank you for taking time to come for your Medicare Wellness Visit. I appreciate your ongoing commitment to your health goals. Please review the following plan we discussed and let me know if I can assist you in the future.   Please check your blood pressure at home daily for three days, record and provide to Dr. Chales Abrahams Screening recommendations/referrals: Colonoscopy aged out Mammogram aged out Bone Density due in 2026 Recommended yearly ophthalmology/optometry visit for glaucoma screening and checkup Recommended yearly dental visit for hygiene and checkup  Vaccinations: Influenza vaccine- due annually in September/October Pneumococcal vaccine up to date Tdap vaccine up to date Shingles vaccine up to date    Advanced directives: reviewed  Conditions/risks identified: cardiac risk   Next appointment: 1 year   Preventive Care 44 Years and Older, Female Preventive care refers to lifestyle choices and visits with your health care provider that can promote health and wellness. What does preventive care include? A yearly physical exam. This is also called an annual well check. Dental exams once or twice a year. Routine eye exams. Ask your health care provider how often you should have your eyes checked. Personal lifestyle choices, including: Daily care of your teeth and gums. Regular physical activity. Eating a healthy diet. Avoiding tobacco and drug use. Limiting alcohol use. Practicing safe sex. Taking low-dose aspirin every day. Taking vitamin and mineral supplements as recommended by your health care provider. What happens during an annual well check? The services and screenings done by your health care provider during your annual well check will depend on your age, overall health, lifestyle risk factors, and family history of disease. Counseling  Your health care provider may ask you questions about your: Alcohol use. Tobacco use. Drug use. Emotional  well-being. Home and relationship well-being. Sexual activity. Eating habits. History of falls. Memory and ability to understand (cognition). Work and work Astronomer. Reproductive health. Screening  You may have the following tests or measurements: Height, weight, and BMI. Blood pressure. Lipid and cholesterol levels. These may be checked every 5 years, or more frequently if you are over 83 years old. Skin check. Lung cancer screening. You may have this screening every year starting at age 46 if you have a 30-pack-year history of smoking and currently smoke or have quit within the past 15 years. Fecal occult blood test (FOBT) of the stool. You may have this test every year starting at age 2. Flexible sigmoidoscopy or colonoscopy. You may have a sigmoidoscopy every 5 years or a colonoscopy every 10 years starting at age 76. Hepatitis C blood test. Hepatitis B blood test. Sexually transmitted disease (STD) testing. Diabetes screening. This is done by checking your blood sugar (glucose) after you have not eaten for a while (fasting). You may have this done every 1-3 years. Bone density scan. This is done to screen for osteoporosis. You may have this done starting at age 44. Mammogram. This may be done every 1-2 years. Talk to your health care provider about how often you should have regular mammograms. Talk with your health care provider about your test results, treatment options, and if necessary, the need for more tests. Vaccines  Your health care provider may recommend certain vaccines, such as: Influenza vaccine. This is recommended every year. Tetanus, diphtheria, and acellular pertussis (Tdap, Td) vaccine. You may need a Td booster every 10 years. Zoster vaccine. You may need this after age 19. Pneumococcal 13-valent conjugate (PCV13) vaccine. One dose is recommended after age 45. Pneumococcal  polysaccharide (PPSV23) vaccine. One dose is recommended after age 92. Talk to your  health care provider about which screenings and vaccines you need and how often you need them. This information is not intended to replace advice given to you by your health care provider. Make sure you discuss any questions you have with your health care provider. Document Released: 09/06/2015 Document Revised: 04/29/2016 Document Reviewed: 06/11/2015 Elsevier Interactive Patient Education  2017 [employer].  Fall Prevention in the Home Falls can cause injuries. They can happen to people of all ages. There are many things you can do to make your home safe and to help prevent falls. What can I do on the outside of my home? Regularly fix the edges of walkways and driveways and fix any cracks. Remove anything that might make you trip as you walk through a door, such as a raised step or threshold. Trim any bushes or trees on the path to your home. Use bright outdoor lighting. Clear any walking paths of anything that might make someone trip, such as rocks or tools. Regularly check to see if handrails are loose or broken. Make sure that both sides of any steps have handrails. Any raised decks and porches should have guardrails on the edges. Have any leaves, snow, or ice cleared regularly. Use sand or salt on walking paths during winter. Clean up any spills in your garage right away. This includes oil or grease spills. What can I do in the bathroom? Use night lights. Install grab bars by the toilet and in the tub and shower. Do not use towel bars as grab bars. Use non-skid mats or decals in the tub or shower. If you need to sit down in the shower, use a plastic, non-slip stool. Keep the floor dry. Clean up any water that spills on the floor as soon as it happens. Remove soap buildup in the tub or shower regularly. Attach bath mats securely with double-sided non-slip rug tape. Do not have throw rugs and other things on the floor that can make you trip. What can I do in the bedroom? Use night  lights. Make sure that you have a light by your bed that is easy to reach. Do not use any sheets or blankets that are too big for your bed. They should not hang down onto the floor. Have a firm chair that has side arms. You can use this for support while you get dressed. Do not have throw rugs and other things on the floor that can make you trip. What can I do in the kitchen? Clean up any spills right away. Avoid walking on wet floors. Keep items that you use a lot in easy-to-reach places. If you need to reach something above you, use a strong step stool that has a grab bar. Keep electrical cords out of the way. Do not use floor polish or wax that makes floors slippery. If you must use wax, use non-skid floor wax. Do not have throw rugs and other things on the floor that can make you trip. What can I do with my stairs? Do not leave any items on the stairs. Make sure that there are handrails on both sides of the stairs and use them. Fix handrails that are broken or loose. Make sure that handrails are as long as the stairways. Check any carpeting to make sure that it is firmly attached to the stairs. Fix any carpet that is loose or worn. Avoid having throw rugs at the top  or bottom of the stairs. If you do have throw rugs, attach them to the floor with carpet tape. Make sure that you have a light switch at the top of the stairs and the bottom of the stairs. If you do not have them, ask someone to add them for you. What else can I do to help prevent falls? Wear shoes that: Do not have high heels. Have rubber bottoms. Are comfortable and fit you well. Are closed at the toe. Do not wear sandals. If you use a stepladder: Make sure that it is fully opened. Do not climb a closed stepladder. Make sure that both sides of the stepladder are locked into place. Ask someone to hold it for you, if possible. Clearly mark and make sure that you can see: Any grab bars or handrails. First and last  steps. Where the edge of each step is. Use tools that help you move around (mobility aids) if they are needed. These include: Canes. Walkers. Scooters. Crutches. Turn on the lights when you go into a dark area. Replace any light bulbs as soon as they burn out. Set up your furniture so you have a clear path. Avoid moving your furniture around. If any of your floors are uneven, fix them. If there are any pets around you, be aware of where they are. Review your medicines with your doctor. Some medicines can make you feel dizzy. This can increase your chance of falling. Ask your doctor what other things that you can do to help prevent falls. This information is not intended to replace advice given to you by your health care provider. Make sure you discuss any questions you have with your health care provider. Document Released: 06/06/2009 Document Revised: 01/16/2016 Document Reviewed: 09/14/2014 Elsevier Interactive Patient Education  2017 [employer].

## 2023-05-10 NOTE — Progress Notes (Signed)
Subjective:   Pamlea Wolfe is a 77 y.o. female who presents for Medicare Annual (Subsequent) preventive examination at [facility] clinic setting.   Visit Complete: In person  Patient Medicare AWV questionnaire was completed by the patient on 05/10/2023; I have confirmed that all information answered by patient is correct and no changes since this date.  Cardiac Risk Factors include: advanced age (>30men, >50 women);hypertension     Objective:    Today's Vitals   05/10/23 1311 05/10/23 1320  BP: (!) 150/90   Pulse: 78   Resp: 17   Temp: 97.7 F (36.5 C)   TempSrc: Temporal   SpO2: 96%   Weight: 183 lb 9.6 oz (83.3 kg)   Height: 5\' 6"  (1.676 m)   PainSc:  0-No pain   Body mass index is 29.63 kg/m.     05/10/2023    1:13 PM 02/02/2023    1:37 PM 12/07/2022   10:33 AM 09/28/2022    1:51 PM 08/30/2022    9:09 AM 07/07/2022    3:19 PM 06/16/2022    2:37 PM  Advanced Directives  Does Patient Have a Medical Advance Directive? Yes Yes Yes Yes No Yes Yes  Type of Estate agent of [place];Living will Healthcare Power of [place];Living will;Out of facility DNR (pink MOST or yellow form) Healthcare Power of [place];Living will Healthcare Power of [place];Living will;Out of facility DNR (pink MOST or yellow form)  Healthcare Power of [place];Living will;Out of facility DNR (pink MOST or yellow form) Healthcare Power of [place];Living will;Out of facility DNR (pink MOST or yellow form)  Does patient want to make changes to medical advance directive? No - Patient declined No - Patient declined  No - Patient declined  No - Patient declined No - Patient declined  Copy of Healthcare Power of Attorney in Chart? Yes - validated most recent copy scanned in chart (See row information)     Yes - validated most recent copy scanned in chart (See row information) Yes - validated most recent copy scanned in chart (See row information)  Would patient like  information on creating a medical advance directive?     No - Patient declined      Current Medications (verified) Outpatient Encounter Medications as of 05/10/2023  Medication Sig   amLODipine (NORVASC) 2.5 MG tablet TAKE 1 TABLET BY MOUTH EVERY DAY   aspirin EC (ASPIRIN ADULT LOW DOSE) 81 MG tablet Take 81 mg by mouth once.   BIOTIN 5000 PO Take 1 capsule by mouth daily.   calcium carbonate (OSCAL) 1500 (600 Ca) MG TABS tablet Take 1 tablet (1,500 mg total) by mouth 2 (two) times daily with a meal.   candesartan-hydrochlorothiazide (ATACAND HCT) 32-12.5 MG tablet TAKE 1 TABLET BY MOUTH EVERY DAY   Cholecalciferol (VITAMIN D3) 50 MCG (2000 UT) capsule Take 2,000 Units by mouth daily.   Cyanocobalamin (VITAMIN B-12) 2500 MCG SUBL Take 1 tablet by mouth daily.   esomeprazole (NEXIUM) 20 MG capsule Take 20 mg by mouth daily at 12 noon.   magic mouthwash (nystatin, lidocaine, diphenhydrAMINE, alum & mag hydroxide) suspension Swish and spit 5 mLs 3 (three) times daily as needed for mouth pain.   metFORMIN (GLUCOPHAGE-XR) 500 MG 24 hr tablet Take 1 tablet (500 mg total) by mouth daily with breakfast.   Polyethyl Glycol-Propyl Glycol (SYSTANE) 0.4-0.3 % SOLN Apply to eye daily.   psyllium (METAMUCIL) 58.6 % packet Take 1 packet by mouth daily.   rosuvastatin (CRESTOR) 20 MG  tablet TAKE 1 TABLET BY MOUTH EVERY DAY   Turmeric (QC TUMERIC COMPLEX) 500 MG CAPS Take 500 mg by mouth daily.   vitamin C (ASCORBIC ACID) 500 MG tablet Take 500 mg by mouth daily.   No facility-administered encounter medications on file as of 05/10/2023.    Allergies (verified) Amoxicillin and Zithromax [azithromycin]   History: Past Medical History:  Diagnosis Date   Arthritis    Per [facility] New Patient Packet    Bilateral malignant neoplasm of breast in female [facility]) 10/18/2018   Breast cancer (HCC)    Per [facility] New Patient Packet    Cataract    Essential hypertension 10/25/2015   GERD (gastroesophageal reflux disease)     High blood pressure    Per [facility] New Patient Packet    High cholesterol    Per [facility] New Patient Packet    Hyperlipidemia LDL goal <130 10/25/2015   Pneumonia    Prediabetes 11/19/2017   Shingles    Past Surgical History:  Procedure Laterality Date   CATARACT EXTRACTION  2018   Per [facility] New Patient Packet, Dr.Todd Casper Harrison    CESAREAN SECTION     x2   COLONOSCOPY  2018   Per [facility] new patient packet, Dr.Locker   MASTECTOMY Bilateral 02/02/2019   Per [facility] New Patient Packet, DrMichele Blackwood and Dr.Colon    MENISCECTOMY Right 2009   MENISCUS REPAIR  2004   Per [facility] New Patient Packet, Dr. Karolee Stamps   TOENAIL TRIMMING Left    ingrown in big toe   TONSILLECTOMY  1951   Per [facility] New Patient Packet   Family History  Problem Relation Age of Onset   Alzheimer's disease Mother    Colon cancer Mother 81   Ulcers Father    Parkinsonism Father    Osteoporosis Sister    Stomach cancer Neg Hx    Social History   Socioeconomic History   Marital status: Divorced    Spouse name: Not on file   Number of children: 2   Years of education: Not on file   Highest education level: Not on file  Occupational History   Not on file  Tobacco Use   Smoking status: Former    Current packs/day: 0.00    Types: Cigarettes    Start date: 56    Quit date: 1995    Years since quitting: 29.7   Smokeless tobacco: Never  Vaping Use   Vaping status: Never Used  Substance and Sexual Activity   Alcohol use: Not Currently   Drug use: Not Currently   Sexual activity: Not Currently    Partners: Male    Birth control/protection: Post-menopausal    Comment: older than 16, less than 5  Other Topics Concern   Not on file  Social History Narrative   Diet: Healthy      Caffeine: 1 cup of coffee in the morning       Married, if yes what year: Divorced, married in 1975      Do you live in a house, apartment, assisted living, [place], trailer, ect: Apartment, more than 1 stories, 1 person.       Pets:  No      Current/Past profession: Masters, Training and development officer       Exercise: Yes, TV class 1 hour M-F         Living Will: Yes   DNR: No   POA/HPOA: Yes      Functional Status:   Do you have  difficulty bathing or dressing yourself? Did not answer   Do you have difficulty preparing food or eating? Did not answer   Do you have difficulty managing your medications? Did not answer   Do you have difficulty managing your finances? Did not answer   Do you have difficulty affording your medications? Did not answer   Social Determinants of Health   Financial Resource Strain: Not on file  Food Insecurity: No Food Insecurity (06/17/2019)   [facility], [facility]   Hunger Vital Sign    Worried About Running Out of Food in the Last Year: Never true    Ran Out of Food in the Last Year: Never true  Transportation Needs: Not on file  Physical Activity: Not on file  Stress: No Stress Concern Present (06/17/2019)   Received from [facility], [facility] - Occupational Stress Questionnaire    Feeling of Stress : Only a little  Social Connections: Not on file    Tobacco Counseling Counseling given: Not Answered   Clinical Intake:     Pain : No/denies pain Pain Score: 0-No pain     BMI - recorded: 29.63 Nutritional Status: BMI 25 -29 Overweight Nutritional Risks: None Diabetes: Yes CBG done?: No Did pt. bring in CBG monitor from home?: No  How often do you need to have someone help you when you read instructions, pamphlets, or other written materials from your doctor or pharmacy?: 1 - Never What is the last grade level you completed in school?: masters degree         Activities of Daily Living    05/10/2023    1:31 PM  In your present state of health, do you have any difficulty performing the following activities:  Hearing? 0  Vision? 1  Difficulty concentrating or making  decisions? 0  Walking or climbing stairs? 0  Dressing or bathing? 0  Doing errands, shopping? 0  Preparing Food and eating ? N  Using the Toilet? N  In the past six months, have you accidently leaked urine? N  Do you have problems with loss of bowel control? N  Managing your Medications? N  Managing your Finances? N  Housekeeping or managing your Housekeeping? N    Patient Care Team: [name], MD as PCP - General (Internal Medicine) Pollyann Samples, NP as Nurse Practitioner (Hematology and Oncology)  Indicate any recent Medical Services you may have received from other than [facility] providers in the past year (date may be approximate).     Assessment:   This is a routine wellness examination for Katja.  Hearing/Vision screen Hearing Screening - Comments:: Patient declined Vision Screening - Comments:: Yes in the spring   Goals Addressed             This Visit's Progress    Keep Low Back Pain Under Control       Timeframe:  Short-Term Goal Priority:  Medium    - stay active    Why is this important?   Day-to-day life can be hard when you have back pain.  Pain medicine is just one piece of the treatment puzzle. There are many things you can do to manage pain and keep your back strong.   Lifestyle changes, like stopping smoking and eating foods with Vitamin D and calcium, keep your bones and muscles healthy. Your back is better when it is supported by strong muscles.  You can try these action steps  to help you manage your pain.     Notes:        Depression Screen    05/10/2023    1:13 PM 06/16/2022    2:37 PM 03/09/2022    2:45 PM 09/04/2020    9:32 AM 11/01/2019    2:42 PM 09/20/2019   10:59 AM 05/10/2019   10:20 AM  PHQ 2/9 Scores  PHQ - 2 Score 0 0 0 0 0 0 0    Fall Risk    05/10/2023    1:13 PM 06/16/2022    2:37 PM 03/09/2022    2:44 PM 12/17/2021    9:05 AM 11/10/2021    9:10 AM  Fall Risk   Falls in the past year? 0 0 0 0 0  Number falls in past  yr: 0 0 0 0 0  Injury with Fall? 0 0 0 0 0  Risk for fall due to : No Fall Risks No Fall Risks No Fall Risks No Fall Risks No Fall Risks  Follow up Falls evaluation completed Falls evaluation completed Falls evaluation completed Falls evaluation completed Falls evaluation completed    MEDICARE RISK AT HOME: Medicare Risk at Home Any stairs in or around the home?: Yes If so, are there any without handrails?: Yes Home free of loose throw rugs in walkways, pet beds, electrical cords, etc?: No Adequate lighting in your home to reduce risk of falls?: Yes Life alert?: No Use of a cane, walker or w/c?: No Grab bars in the bathroom?: Yes Shower chair or bench in shower?: No Elevated toilet seat or a handicapped toilet?: No  TIMED UP AND GO:  Was the test performed?  Yes  Length of time to ambulate 10 feet: 12sec Gait steady and fast without use of assistive device    Cognitive Function:    05/10/2023    1:14 PM  MMSE - Mini Mental State Exam  Orientation to time 5  Orientation to Place 5  Registration 3  Attention/ Calculation 5  Recall 3  Language- name 2 objects 2  Language- repeat 1  Language- follow 3 step command 3  Language- read & follow direction 1  Write a sentence 1  Copy design 1  Total score 30        Immunizations Immunization History  Administered Date(s) Administered   Covid-19, Mrna,Vaccine(Spikevax)70yrs and older 06/18/2022, 12/17/2022   Fluad Quad(high Dose 65+) 06/01/2022   Hepb-cpg 03/11/2022, 04/11/2022   Influenza Split 06/24/2014   Influenza, High Dose Seasonal PF 05/29/2013, 05/22/2021   Influenza,inj,Quad PF,6+ Mos 06/10/2015, 05/01/2016, 05/10/2017, 06/01/2018   Influenza-Unspecified 05/24/2013, 06/24/2014, 06/10/2015, 05/01/2016, 06/01/2018, 05/18/2019, 05/25/2020   Moderna Covid-19 Vaccine Bivalent Booster 25yrs & up 05/15/2021   Moderna Sars-Covid-2 Vaccination 09/10/2019, 10/03/2019, 06/25/2020, 11/28/2020   Pneumococcal Conjugate-13  08/25/2015, 10/25/2015   Pneumococcal Polysaccharide-23 03/06/2013   Tdap 08/27/2014   Zoster Recombinant(Shingrix) 11/16/2017, 03/22/2018   Zoster, Live 11/03/2010    TDAP status: Up to date  Flu Vaccine status: Due, Education has been provided regarding the importance of this vaccine. Advised may receive this vaccine at local pharmacy or Health Dept. Aware to provide a copy of the vaccination record if obtained from local pharmacy or Health Dept. Verbalized acceptance and understanding.  Pneumococcal vaccine status: Up to date  Covid-19 vaccine status: Information provided on how to obtain vaccines.   Qualifies for Shingles Vaccine? Yes   Zostavax completed Yes   Shingrix Completed?: Yes  Screening Tests Health Maintenance  Topic Date Due  Diabetic kidney evaluation - Urine ACR  Never done   OPHTHALMOLOGY EXAM  10/23/2022   INFLUENZA VACCINE  03/25/2023   COVID-19 Vaccine (8 - 2023-24 season) 04/25/2023   FOOT EXAM  06/17/2023   HEMOGLOBIN A1C  11/01/2023   Diabetic kidney evaluation - eGFR measurement  05/03/2024   Medicare Annual Wellness (AWV)  05/09/2024   DTaP/Tdap/Td (2 - Td or Tdap) 08/27/2024   Pneumonia Vaccine 6+ Years old  Completed   DEXA SCAN  Completed   Hepatitis C Screening  Completed   Zoster Vaccines- Shingrix  Completed   HPV VACCINES  Aged Out   Colonoscopy  Discontinued    Health Maintenance  Health Maintenance Due  Topic Date Due   Diabetic kidney evaluation - Urine ACR  Never done   OPHTHALMOLOGY EXAM  10/23/2022   INFLUENZA VACCINE  03/25/2023   COVID-19 Vaccine (8 - 2023-24 season) 04/25/2023    Colorectal cancer screening: No longer required.   Mammogram status: No longer required due to bilateral mastectomy.  Bone Density status: Completed 09/2022. Results reflect: Bone density results: OSTEOPENIA. Repeat every 2 years.  Lung Cancer Screening: (Low Dose CT Chest recommended if Age 3-80 years, 20 pack-year currently smoking OR have  quit w/in 15years.) does not qualify.   Lung Cancer Screening Referral: NA  Additional Screening:  Hepatitis C Screening: does not qualify; Completed already done  Vision Screening: Recommended annual ophthalmology exams for early detection of glaucoma and other disorders of the eye. Is the patient up to date with their annual eye exam?  Yes  Who is the provider or what is the name of the office in which the patient attends annual eye exams? [facility] If pt is not established with a provider, would they like to be referred to a provider to establish care? No .   Dental Screening: Recommended annual dental exams for proper oral hygiene  Diabetic Foot Exam: Diabetic Foot Exam: Completed 05/2022  Community Resource Referral / Chronic Care Management: CRR required this visit?  No   CCM required this visit?  No     Plan:     I have personally reviewed and noted the following in the patient's chart:   Medical and social history Use of alcohol, tobacco or illicit drugs  Current medications and supplements including opioid prescriptions. Patient is not currently taking opioid prescriptions. Functional ability and status Nutritional status Physical activity Advanced directives List of other physicians Hospitalizations, surgeries, and ER visits in previous 12 months Vitals Screenings to include cognitive, depression, and falls Referrals and appointments  In addition, I have reviewed and discussed with patient certain preventive protocols, quality metrics, and best practice recommendations. A written personalized care plan for preventive services as well as general preventive health recommendations were provided to patient.     Fletcher Anon, NP   05/10/2023   After Visit Summary Given to patient  Nurse Notes:   Please check your blood pressure at home daily for three days, record and provide to Dr. Chales Abrahams

## 2023-05-11 ENCOUNTER — Encounter: Payer: BLUE CROSS/BLUE SHIELD | Admitting: Internal Medicine

## 2023-05-11 ENCOUNTER — Encounter: Payer: BLUE CROSS/BLUE SHIELD | Admitting: Vascular Surgery

## 2023-05-17 ENCOUNTER — Ambulatory Visit
Admission: RE | Admit: 2023-05-17 | Discharge: 2023-05-17 | Disposition: A | Discharge status: Home / Self Care | Payer: Medicare Other | Source: Ambulatory Visit | Attending: Vascular Surgery | Admitting: Vascular Surgery

## 2023-05-17 DIAGNOSIS — I70212 Atherosclerosis of native arteries of extremities with intermittent claudication, left leg: Secondary | ICD-10-CM | POA: Diagnosis not present

## 2023-05-17 DIAGNOSIS — K8020 Calculus of gallbladder without cholecystitis without obstruction: Secondary | ICD-10-CM | POA: Diagnosis not present

## 2023-05-17 MED ORDER — IOPAMIDOL (ISOVUE-370) INJECTION 76%
200.0000 mL | Freq: Once | INTRAVENOUS | Status: AC | PRN
  Administered 2023-05-17: 100 mL via INTRAVENOUS

## 2023-05-18 ENCOUNTER — Encounter: Payer: Self-pay | Admitting: Internal Medicine

## 2023-05-18 ENCOUNTER — Non-Acute Institutional Stay: Payer: BLUE CROSS/BLUE SHIELD | Admitting: Internal Medicine

## 2023-05-18 VITALS — BP 138/78 | HR 85 | Temp 97.7°F | Resp 16 | Ht 66.0 in | Wt 185.0 lb

## 2023-05-18 DIAGNOSIS — Z87891 Personal history of nicotine dependence: Secondary | ICD-10-CM | POA: Diagnosis not present

## 2023-05-18 DIAGNOSIS — I10 Essential (primary) hypertension: Secondary | ICD-10-CM | POA: Diagnosis not present

## 2023-05-18 DIAGNOSIS — K760 Fatty (change of) liver, not elsewhere classified: Secondary | ICD-10-CM

## 2023-05-18 DIAGNOSIS — E785 Hyperlipidemia, unspecified: Secondary | ICD-10-CM

## 2023-05-18 DIAGNOSIS — E119 Type 2 diabetes mellitus without complications: Secondary | ICD-10-CM

## 2023-05-18 HISTORY — DX: Personal history of nicotine dependence: Z87.891

## 2023-05-18 HISTORY — DX: Fatty (change of) liver, not elsewhere classified: K76.0

## 2023-05-18 MED ORDER — METFORMIN HCL 1000 MG PO TABS: 1000.0000 mg | ORAL_TABLET | Freq: Two times a day (BID) | ORAL | 3 refills | Status: DC

## 2023-05-18 NOTE — Progress Notes (Signed)
Location: [facility] features editor of Service:  Clinic (12)  Provider:   Code Status:  Goals of Care:     05/10/2023    1:13 PM  Advanced Directives  Does Patient Have a Medical Advance Directive? Yes  Type of Estate agent of [place];Living will  Does patient want to make changes to medical advance directive? No - Patient declined  Copy of Healthcare Power of Attorney in Chart? Yes - validated most recent copy scanned in chart (See row information)     Chief Complaint  Patient presents with   Medical Management of Chronic Issues    3 MONTH FOLLOW UP   Immunizations    Patient is due for covid and flu vaccine    HPI: Patient is a 77 y.o. female seen today for an acute visit for Elevated A1C  [place] Hypertension, prediabetes and diet-controlled, HLD, B12 deficiency   Elevated A1C Started on Metformin No change in her A1C Actually it went up She is watching her diet but lot of stress due to her recent testing She is now swimming 3 times Week No Side effects due to Metformin  Elevated LFTs.  Ultrasound showed NASH  Per GI Max control of Diabetes Avoid Alcohol  Q 6 months LFTS to see the trend  Bilateral mastectomy for breast cancer Not on tamoxifen any more due to side effects Follows with Dr Parke Poisson  Recent ABI shows PAD Plan for CTA done yesterday  Results pending Has low back pain and Also  Claudication  Hypertension BP at home in the morning have been high but most of the day they have been around 140 -150 in the morning sbp   Past Medical History:  Diagnosis Date   Arthritis    Per [facility] New Patient Packet    Bilateral malignant neoplasm of breast in female [facility]) 10/18/2018   Breast cancer (HCC)    Per [facility] New Patient Packet    Cataract    Essential hypertension 10/25/2015   GERD (gastroesophageal reflux disease)    High blood pressure    Per [facility] New Patient Packet    High cholesterol    Per [facility] New  Patient Packet    Hyperlipidemia LDL goal <130 10/25/2015   Pneumonia    Prediabetes 11/19/2017   Shingles     Past Surgical History:  Procedure Laterality Date   CATARACT EXTRACTION  2018   Per [facility] New Patient Packet, Dr.Todd Leventhal    CESAREAN SECTION     x2   COLONOSCOPY  2018   Per [facility] new patient packet, Dr.Locker   MASTECTOMY Bilateral 02/02/2019   Per [facility] New Patient Packet, DrMichele Blackwood and Dr.Colon    MENISCECTOMY Right 2009   MENISCUS REPAIR  2004   Per [facility] New Patient Packet, Dr. Karolee Stamps   TOENAIL TRIMMING Left    ingrown in big toe   TONSILLECTOMY  1951   Per [facility] New Patient Packet    Allergies  Allergen Reactions   Amoxicillin Rash    Had rash after taking amoxil and zithromax, not sure which one caused the rash   Zithromax [Azithromycin] Rash    Had rash after taking zithromax and amoxil at the same time, not sure which antibiotic caused the rash    Outpatient Encounter Medications as of 05/18/2023  Medication Sig   amLODipine (NORVASC) 2.5 MG tablet TAKE 1 TABLET BY MOUTH EVERY DAY   aspirin EC (ASPIRIN ADULT LOW DOSE)  81 MG tablet Take 81 mg by mouth once.   BIOTIN 5000 PO Take 1 capsule by mouth daily.   calcium carbonate (OSCAL) 1500 (600 Ca) MG TABS tablet Take 1 tablet (1,500 mg total) by mouth 2 (two) times daily with a meal.   candesartan-hydrochlorothiazide (ATACAND HCT) 32-12.5 MG tablet TAKE 1 TABLET BY MOUTH EVERY DAY   Cholecalciferol (VITAMIN D3) 50 MCG (2000 UT) capsule Take 2,000 Units by mouth daily.   Cyanocobalamin (VITAMIN B-12) 2500 MCG SUBL Take 1 tablet by mouth daily.   esomeprazole (NEXIUM) 20 MG capsule Take 20 mg by mouth daily at 12 noon.   magic mouthwash (nystatin, lidocaine, diphenhydrAMINE, alum & mag hydroxide) suspension Swish and spit 5 mLs 3 (three) times daily as needed for mouth pain.   metFORMIN (GLUCOPHAGE) 1000 MG tablet Take 1 tablet (1,000 mg total) by mouth 2 (two) times daily with a meal.   Polyethyl  Glycol-Propyl Glycol (SYSTANE) 0.4-0.3 % SOLN Apply to eye daily.   psyllium (METAMUCIL) 58.6 % packet Take 1 packet by mouth daily.   rosuvastatin (CRESTOR) 20 MG tablet TAKE 1 TABLET BY MOUTH EVERY DAY   Turmeric (QC TUMERIC COMPLEX) 500 MG CAPS Take 500 mg by mouth daily.   vitamin C (ASCORBIC ACID) 500 MG tablet Take 500 mg by mouth daily.   [DISCONTINUED] metFORMIN (GLUCOPHAGE-XR) 500 MG 24 hr tablet Take 1 tablet (500 mg total) by mouth daily with breakfast.   No facility-administered encounter medications on file as of 05/18/2023.    Review of Systems:  Review of Systems  Constitutional:  Negative for activity change and appetite change.  HENT: Negative.    Respiratory:  Negative for cough and shortness of breath.   Cardiovascular:  Negative for leg swelling.  Gastrointestinal:  Negative for constipation.  Genitourinary: Negative.   Musculoskeletal:  Negative for arthralgias, gait problem and myalgias.  Skin: Negative.   Neurological:  Negative for dizziness and weakness.  Psychiatric/Behavioral:  Negative for confusion, dysphoric mood and sleep disturbance.     Health Maintenance  Topic Date Due   Diabetic kidney evaluation - Urine ACR  Never done   INFLUENZA VACCINE  03/25/2023   COVID-19 Vaccine (8 - 2023-24 season) 04/25/2023   FOOT EXAM  06/17/2023   OPHTHALMOLOGY EXAM  10/23/2023   HEMOGLOBIN A1C  11/01/2023   Diabetic kidney evaluation - eGFR measurement  05/03/2024   Medicare Annual Wellness (AWV)  05/09/2024   DTaP/Tdap/Td (2 - Td or Tdap) 08/27/2024   Pneumonia Vaccine 27+ Years old  Completed   DEXA SCAN  Completed   Hepatitis C Screening  Completed   Zoster Vaccines- Shingrix  Completed   HPV VACCINES  Aged Out   Colonoscopy  Discontinued    Physical Exam: Vitals:   05/18/23 1400  BP: 138/78  Pulse: 85  Resp: 16  Temp: 97.7 F (36.5 C)  TempSrc: Temporal  SpO2: 96%  Weight: 185 lb (83.9 kg)  Height: 5\' 6"  (1.676 m)   Body mass index is 29.86  kg/m. Physical Exam Vitals reviewed.  Constitutional:      Appearance: Normal appearance.  HENT:     Head: Normocephalic.     Nose: Nose normal.     Mouth/Throat:     Mouth: Mucous membranes are moist.     Pharynx: Oropharynx is clear.  Eyes:     Pupils: Pupils are equal, round, and reactive to light.  Cardiovascular:     Rate and Rhythm: Normal rate and regular rhythm.  Pulses: Normal pulses.     Heart sounds: Normal heart sounds. No murmur heard. Pulmonary:     Effort: Pulmonary effort is normal.     Breath sounds: Normal breath sounds.  Abdominal:     General: Abdomen is flat. Bowel sounds are normal.     Palpations: Abdomen is soft.  Musculoskeletal:        General: No swelling.     Cervical back: Neck supple.  Skin:    General: Skin is warm.  Neurological:     General: No focal deficit present.     Mental Status: She is alert and oriented to person, place, and time.  Psychiatric:        Mood and Affect: Mood normal.        Thought Content: Thought content normal.     Labs reviewed: Basic Metabolic Panel: Recent Labs    06/11/22 0000 09/24/22 0000 12/07/22 0957  NA 142 141 139  K 4.1 4.2 3.9  CL 104 105 104  CO2 26* 22 25  GLUCOSE  --   --  153*  BUN 18 15 16   CREATININE 0.7 0.6 0.74  CALCIUM 9.8 9.9 9.9   Liver Function Tests: Recent Labs    09/24/22 0000 12/07/22 0957 05/04/23 0000  AST 30 45* 55*  ALT 42* 55* 65*  ALKPHOS 73 67 94  BILITOT  --  0.7  --   PROT  --  7.5  --   ALBUMIN 4.3 4.5 4.5   No results for input(s): "LIPASE", "AMYLASE" in the last 8760 hours. No results for input(s): "AMMONIA" in the last 8760 hours. CBC: Recent Labs    09/24/22 0000 12/07/22 0957  WBC 4.5 5.7  NEUTROABS  --  3.4  HGB 13.6 13.5  HCT 40 38.4  MCV  --  94.3  PLT 194 198   Lipid Panel: Recent Labs    09/24/22 0000  CHOL 178  HDL 58  LDLCALC 91  TRIG 143   Lab Results  Component Value Date   HGBA1C 7.4 05/04/2023    Procedures  since last visit: No results found.  Assessment/Plan 1. Type 2 diabetes mellitus without complication, without long-term current use of insulin (HCC) Change Metformin to 500 mg BID Repeat A1C in 4 months - Respiratory syncytial virus vaccine, preF, subunit, bivalent,(Abrysvo)  2. Former smoker  - Respiratory syncytial virus vaccine, preF, subunit, bivalent,(Abrysvo)  3. Essential hypertension BP slightly on higher side Will monitor for 4 weeks If still high can go up on Norvasc  4. Hyperlipidemia LDL goal <100 On Crestor   5. Hepatic steatosis Stay Off Alcohol and don't take more then 2 gram of Tylenol  Q 6 months LFTS  Chronic midline low back pain without sciatica Seeing Ortho Limit Tylenol uses especially with Alcohol     Vitamin D deficiency On Supplement Breast cancer Bilateral Mastectomy   Labs/tests ordered:  * No order type specified * Next appt:  Visit date not found

## 2023-05-18 NOTE — Patient Instructions (Signed)
Check BP in the morning 2/week for 4 weeks Let me know  if it is above 140/90

## 2023-05-19 ENCOUNTER — Encounter: Payer: Self-pay | Admitting: Internal Medicine

## 2023-05-21 ENCOUNTER — Other Ambulatory Visit: Payer: Self-pay | Admitting: Internal Medicine

## 2023-05-21 MED ORDER — METFORMIN HCL 500 MG PO TABS: 500.0000 mg | ORAL_TABLET | Freq: Two times a day (BID) | ORAL | 3 refills | Status: DC

## 2023-05-21 MED ORDER — AMLODIPINE BESYLATE 2.5 MG PO TABS: 5.0000 mg | ORAL_TABLET | Freq: Every day | ORAL | Status: DC

## 2023-05-22 ENCOUNTER — Encounter: Payer: Self-pay | Admitting: Internal Medicine

## 2023-05-24 MED ORDER — RSVPREF3 VAC RECOMB ADJUVANTED 120 MCG/0.5ML IM SUSR: 0.5000 mL | Freq: Once | INTRAMUSCULAR | 0 refills | Status: AC

## 2023-05-26 ENCOUNTER — Ambulatory Visit (INDEPENDENT_AMBULATORY_CARE_PROVIDER_SITE_OTHER): Payer: BLUE CROSS/BLUE SHIELD | Admitting: Vascular Surgery

## 2023-05-26 ENCOUNTER — Encounter: Payer: Self-pay | Admitting: Vascular Surgery

## 2023-05-26 VITALS — BP 129/85 | HR 72 | Temp 97.9°F | Resp 20 | Ht 66.0 in | Wt 181.0 lb

## 2023-05-26 DIAGNOSIS — I70212 Atherosclerosis of native arteries of extremities with intermittent claudication, left leg: Secondary | ICD-10-CM

## 2023-05-26 NOTE — Progress Notes (Signed)
Patient ID: Victoria Wolfe, female   DOB: 27-Mar-1946, 77 y.o.   MRN: [id number]  Reason for Consult: Follow-up   Referred by [name], MD  Subjective:     HPI:  Victoria Wolfe is a 77 y.o. female former smoker with back and buttock pain radiating down the left greater than right lower extremity.  She is only able to walk approximately 125 yards prior to having symptoms of the blood and cramping of the left calf and thigh.  She continues on aspirin.  She does not have any tissue loss or ulceration.  She has undergone CT scan now follows up for discussion of options moving forward.  Past Medical History:  Diagnosis Date   Arthritis    Per [facility] New Patient Packet    Bilateral malignant neoplasm of breast in female [facility]) 10/18/2018   Breast cancer [facility])    Per [facility] New Patient Packet    Cataract    Essential hypertension 10/25/2015   GERD (gastroesophageal reflux disease)    High blood pressure    Per [facility] New Patient Packet    High cholesterol    Per [facility] New Patient Packet    Hyperlipidemia LDL goal <130 10/25/2015   Pneumonia    Prediabetes 11/19/2017   Shingles    Family History  Problem Relation Age of Onset   Alzheimer's disease Mother    Colon cancer Mother 67   Ulcers Father    Parkinsonism Father    Osteoporosis Sister    Stomach cancer Neg Hx    Past Surgical History:  Procedure Laterality Date   CATARACT EXTRACTION  2018   Per [facility] New Patient Packet, Dr.Todd Leventhal    CESAREAN SECTION     x2   COLONOSCOPY  2018   Per [facility] new patient packet, Dr.Locker   MASTECTOMY Bilateral 02/02/2019   Per [facility] New Patient Packet, DrMichele Blackwood and Dr.Colon    MENISCECTOMY Right 2009   MENISCUS REPAIR  2004   Per [facility] New Patient Packet, Dr. Karolee Stamps   TOENAIL TRIMMING Left    ingrown in big toe   TONSILLECTOMY  1951   Per [facility] New Patient Packet    Short Social History:  Social History   Tobacco Use   Smoking status: Former    Current packs/day: 0.00     Types: Cigarettes    Start date: 1970    Quit date: 1995    Years since quitting: 29.7   Smokeless tobacco: Never  Substance Use Topics   Alcohol use: Not Currently    Allergies  Allergen Reactions   Amoxicillin Rash    Had rash after taking amoxil and zithromax, not sure which one caused the rash   Zithromax [Azithromycin] Rash    Had rash after taking zithromax and amoxil at the same time, not sure which antibiotic caused the rash    Current Outpatient Medications  Medication Sig Dispense Refill   amLODipine (NORVASC) 2.5 MG tablet Take 2 tablets (5 mg total) by mouth daily.     aspirin EC (ASPIRIN ADULT LOW DOSE) 81 MG tablet Take 81 mg by mouth once.     BIOTIN 5000 PO Take 1 capsule by mouth daily.     calcium carbonate (OSCAL) 1500 (600 Ca) MG TABS tablet Take 1 tablet (1,500 mg total) by mouth 2 (two) times daily with a meal. 60 tablet 0   candesartan-hydrochlorothiazide (ATACAND HCT) 32-12.5 MG tablet TAKE 1 TABLET BY MOUTH EVERY DAY 90 tablet 1  Cholecalciferol (VITAMIN D3) 50 MCG (2000 UT) capsule Take 2,000 Units by mouth daily.     Cyanocobalamin (VITAMIN B-12) 2500 MCG SUBL Take 1 tablet by mouth daily.     esomeprazole (NEXIUM) 20 MG capsule Take 20 mg by mouth daily at 12 noon.     magic mouthwash (nystatin, lidocaine, diphenhydrAMINE, alum & mag hydroxide) suspension Swish and spit 5 mLs 3 (three) times daily as needed for mouth pain. 180 mL 1   metFORMIN (GLUCOPHAGE) 500 MG tablet Take 1 tablet (500 mg total) by mouth 2 (two) times daily with a meal. 180 tablet 3   Polyethyl Glycol-Propyl Glycol (SYSTANE) 0.4-0.3 % SOLN Apply to eye daily.     psyllium (METAMUCIL) 58.6 % packet Take 1 packet by mouth daily.     rosuvastatin (CRESTOR) 20 MG tablet TAKE 1 TABLET BY MOUTH EVERY DAY 90 tablet 3   Turmeric (QC TUMERIC COMPLEX) 500 MG CAPS Take 500 mg by mouth daily.     vitamin C (ASCORBIC ACID) 500 MG tablet Take 500 mg by mouth daily.     No current  facility-administered medications for this visit.    Review of Systems  Constitutional:  Constitutional negative. HENT: HENT negative.  Eyes: Eyes negative.  Cardiovascular: Positive for claudication.  GI: Gastrointestinal negative.  Musculoskeletal: Musculoskeletal negative.  Skin: Skin negative.  Neurological: Neurological negative. Hematologic: Hematologic/lymphatic negative.  Psychiatric: Psychiatric negative.        Objective:  Objective   Vitals:   05/26/23 0923  BP: 129/85  Pulse: 72  Resp: 20  Temp: 97.9 F (36.6 C)  SpO2: 97%  Weight: 181 lb (82.1 kg)  Height: 5\' 6"  (1.676 m)   Body mass index is 29.21 kg/m.  Physical Exam HENT:     Head: Normocephalic.     Nose: Nose normal.     Mouth/Throat:     Mouth: Mucous membranes are moist.  Eyes:     Pupils: Pupils are equal, round, and reactive to light.  Neck:     Vascular: No carotid bruit.  Cardiovascular:     Rate and Rhythm: Normal rate.     Pulses:          Femoral pulses are 1+ on the right side and 0 on the left side. Pulmonary:     Effort: Pulmonary effort is normal.  Abdominal:     General: Abdomen is flat.  Musculoskeletal:     Right lower leg: No edema.     Left lower leg: No edema.  Skin:    General: Skin is warm.  Neurological:     General: No focal deficit present.     Mental Status: She is alert.  Psychiatric:        Mood and Affect: Mood normal.        Thought Content: Thought content normal.        Judgment: Judgment normal.     Data: CTA IMPRESSION: 1. 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 2. Short-segment moderate stenosis of the distal left common iliac artery. 3. No significant outflow or runoff disease. 4. 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 5. Cholelithiasis. 6. Colonic diverticulosis. 7. Calcified uterine fibroids. 8. 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  We reviewed her CT angio together today which does demonstrate heavy calcium burden on the left.  She has attempted to continue walking program but given the proximal nature of this disease that is certainly difficult task.  She is taking aspirin and statin.  We will plan for angiography from the right common femoral approach and likely stenting of the left common iliac artery in the near future.  We discussed the risk benefits alternatives as well as the expected need for laying flat 2 to 4 hours after the procedure and no heavy lifting for 2 weeks after surgery.  She demonstrates good understanding we will get her scheduled on Monday in the near future and she will continue aspirin and statin.  Shauntrice Grabe has atherosclerosis of the native arteries of the Left lower extremities causing disabling claudication. The patient is on best medical therapy for peripheral arterial disease. The patient has been counseled about the risks of tobacco use in atherosclerotic disease. The patient has been counseled to abstain from any tobacco use. An aortogram with bilateral lower extremity runoff angiography and Left lower extremity intervention and is indicated to better evaluate the patient's lower extremity circulation because of the limb threatening nature of the patient's diagnosis. Based on the patient's clinical exam and non-invasive data, we anticipate an endovascular intervention in the terminal aortic and iliac vessels. Stenting would be favored because of the improved primary patency of these interventions as compared to plain balloon angioplasty.      Maeola Harman MD [facility]

## 2023-05-27 DIAGNOSIS — Z23 Encounter for immunization: Secondary | ICD-10-CM | POA: Diagnosis not present

## 2023-05-30 ENCOUNTER — Emergency Department (HOSPITAL_BASED_OUTPATIENT_CLINIC_OR_DEPARTMENT_OTHER)
Admission: EM | Admit: 2023-05-30 | Discharge: 2023-05-30 | Disposition: A | Discharge status: Home / Self Care | Payer: BLUE CROSS/BLUE SHIELD | Attending: Emergency Medicine | Admitting: Emergency Medicine

## 2023-05-30 ENCOUNTER — Other Ambulatory Visit: Payer: Self-pay

## 2023-05-30 ENCOUNTER — Emergency Department (HOSPITAL_BASED_OUTPATIENT_CLINIC_OR_DEPARTMENT_OTHER): Payer: BLUE CROSS/BLUE SHIELD

## 2023-05-30 ENCOUNTER — Encounter (HOSPITAL_BASED_OUTPATIENT_CLINIC_OR_DEPARTMENT_OTHER): Payer: Self-pay | Admitting: Emergency Medicine

## 2023-05-30 DIAGNOSIS — Z7984 Long term (current) use of oral hypoglycemic drugs: Secondary | ICD-10-CM | POA: Diagnosis not present

## 2023-05-30 DIAGNOSIS — K5792 Diverticulitis of intestine, part unspecified, without perforation or abscess without bleeding: Secondary | ICD-10-CM

## 2023-05-30 DIAGNOSIS — E278 Other specified disorders of adrenal gland: Secondary | ICD-10-CM | POA: Diagnosis not present

## 2023-05-30 DIAGNOSIS — Z7982 Long term (current) use of aspirin: Secondary | ICD-10-CM | POA: Insufficient documentation

## 2023-05-30 DIAGNOSIS — K5732 Diverticulitis of large intestine without perforation or abscess without bleeding: Secondary | ICD-10-CM | POA: Insufficient documentation

## 2023-05-30 DIAGNOSIS — I10 Essential (primary) hypertension: Secondary | ICD-10-CM | POA: Diagnosis not present

## 2023-05-30 DIAGNOSIS — R1030 Lower abdominal pain, unspecified: Secondary | ICD-10-CM | POA: Diagnosis not present

## 2023-05-30 DIAGNOSIS — K8020 Calculus of gallbladder without cholecystitis without obstruction: Secondary | ICD-10-CM | POA: Diagnosis not present

## 2023-05-30 DIAGNOSIS — Z79899 Other long term (current) drug therapy: Secondary | ICD-10-CM | POA: Diagnosis not present

## 2023-05-30 DIAGNOSIS — K5730 Diverticulosis of large intestine without perforation or abscess without bleeding: Secondary | ICD-10-CM | POA: Diagnosis not present

## 2023-05-30 DIAGNOSIS — E119 Type 2 diabetes mellitus without complications: Secondary | ICD-10-CM | POA: Insufficient documentation

## 2023-05-30 HISTORY — DX: Type 2 diabetes mellitus without complications: E11.9

## 2023-05-30 LAB — COMPREHENSIVE METABOLIC PANEL
ALT: 31 U/L (ref 0–44)
AST: 23 U/L (ref 15–41)
Albumin: 4.2 g/dL (ref 3.5–5.0)
Alkaline Phosphatase: 61 U/L (ref 38–126)
Anion gap: 10 (ref 5–15)
BUN: 15 mg/dL (ref 8–23)
CO2: 25 mmol/L (ref 22–32)
Calcium: 9.4 mg/dL (ref 8.9–10.3)
Chloride: 103 mmol/L (ref 98–111)
Creatinine, Ser: 0.6 mg/dL (ref 0.44–1.00)
GFR, Estimated: >60 mL/min (ref >60)
Glucose, Bld: 154 mg/dL — ABNORMAL HIGH (ref 70–99)
Potassium: 3.5 mmol/L (ref 3.5–5.1)
Sodium: 138 mmol/L (ref 135–145)
Total Bilirubin: 0.6 mg/dL (ref 0.3–1.2)
Total Protein: 7 g/dL (ref 6.5–8.1)

## 2023-05-30 LAB — CBC WITH DIFFERENTIAL/PLATELET
Abs Immature Granulocytes: 0.01 10*3/uL (ref 0.00–0.07)
Basophils Absolute: 0.1 10*3/uL (ref 0.0–0.1)
Basophils Relative: 1 %
Eosinophils Absolute: 0.1 10*3/uL (ref 0.0–0.5)
Eosinophils Relative: 1 %
HCT: 36.7 % (ref 36.0–46.0)
Hemoglobin: 12.7 g/dL (ref 12.0–15.0)
Immature Granulocytes: 0 %
Lymphocytes Relative: 15 %
Lymphs Abs: 1.4 10*3/uL (ref 0.7–4.0)
MCH: 32.2 pg (ref 26.0–34.0)
MCHC: 34.6 g/dL (ref 30.0–36.0)
MCV: 93.1 fL (ref 80.0–100.0)
Monocytes Absolute: 0.7 10*3/uL (ref 0.1–1.0)
Monocytes Relative: 8 %
Neutro Abs: 6.8 10*3/uL (ref 1.7–7.7)
Neutrophils Relative %: 75 %
Platelets: 226 10*3/uL (ref 150–400)
RBC: 3.94 MIL/uL (ref 3.87–5.11)
RDW: 12.1 % (ref 11.5–15.5)
WBC: 9.2 10*3/uL (ref 4.0–10.5)
nRBC: 0 % (ref 0.0–0.2)

## 2023-05-30 MED ORDER — METRONIDAZOLE 500 MG PO TABS
500.0000 mg | ORAL_TABLET | Freq: Once | ORAL | Status: AC
  Administered 2023-05-30: 500 mg via ORAL
  Filled 2023-05-30: qty 1

## 2023-05-30 MED ORDER — METRONIDAZOLE 500 MG PO TABS: 500.0000 mg | ORAL_TABLET | Freq: Two times a day (BID) | ORAL | 0 refills | Status: DC

## 2023-05-30 MED ORDER — IOHEXOL 300 MG/ML  SOLN
100.0000 mL | Freq: Once | INTRAMUSCULAR | Status: AC | PRN
  Administered 2023-05-30: 100 mL via INTRAVENOUS

## 2023-05-30 MED ORDER — CIPROFLOXACIN HCL 500 MG PO TABS: 500.0000 mg | ORAL_TABLET | Freq: Two times a day (BID) | ORAL | 0 refills | Status: DC

## 2023-05-30 MED ORDER — CIPROFLOXACIN HCL 500 MG PO TABS
500.0000 mg | ORAL_TABLET | Freq: Once | ORAL | Status: AC
  Administered 2023-05-30: 500 mg via ORAL
  Filled 2023-05-30: qty 1

## 2023-05-30 NOTE — ED Notes (Signed)
Dc instructions reviewed with patient. Patient voiced understanding. Dc with belongings.  °

## 2023-05-30 NOTE — Discharge Instructions (Signed)
The workup in the emergency room confirms that he had diverticulitis.  No complications noted however.  Take the antibiotics that are prescribed. Read the instructions provided on the condition.  Follow-up with your primary care doctor if symptoms are not improving.  Return to the emergency room if your symptoms are getting worse.

## 2023-05-30 NOTE — ED Triage Notes (Signed)
Pt has extensive diverticulosis but never had diverticulitis, she states she ate popcorn on Thursday (hasn't eaten that in long time),lower abdominal pain started Friday, has worsened. She states her usual bm pattern is 2 times a day, she states last bm Friday, notes she also had covid and flu vaccine Thursday.

## 2023-05-30 NOTE — ED Provider Notes (Signed)
[place] EMERGENCY DEPARTMENT AT [facility] Provider Note   CSN: [id number] Arrival date & time: 05/30/23  4098     History  No chief complaint on file.   Victoria Wolfe is a 77 y.o. female.  HPI    77 year old female with history of hypertension, hyperlipidemia, diabetes and left iliac artery stenosis comes in with chief complaint of lower quadrant abdominal pain.  Patient has been having abdominal pain over the last 3 days.  She has mild burning type discomfort that is constant, but has severe pain that comes and goes in waves, unprovoked.  Patient denies any nausea or emesis or diarrhea, but states that she has lost her appetite.  Review of system is negative for UTI-like symptoms.  She has a history of diverticulosis but no complications from it in the past.  Home Medications Prior to Admission medications   Medication Sig Start Date End Date Taking? Authorizing Provider  ciprofloxacin (CIPRO) 500 MG tablet Take 1 tablet (500 mg total) by mouth every 12 (twelve) hours. 05/30/23  Yes Derwood Kaplan, MD  metroNIDAZOLE (FLAGYL) 500 MG tablet Take 1 tablet (500 mg total) by mouth 2 (two) times daily. 05/30/23  Yes Hermila Millis, MD  amLODipine (NORVASC) 2.5 MG tablet Take 2 tablets (5 mg total) by mouth daily. 05/21/23   [name], MD  aspirin EC (ASPIRIN ADULT LOW DOSE) 81 MG tablet Take 81 mg by mouth once. 04/21/23   [provider]  BIOTIN 5000 PO Take 1 capsule by mouth daily.    [provider]  calcium carbonate (OSCAL) 1500 (600 Ca) MG TABS tablet Take 1 tablet (1,500 mg total) by mouth 2 (two) times daily with a meal. 10/05/22   Fletcher Anon, NP  candesartan-hydrochlorothiazide (ATACAND HCT) 32-12.5 MG tablet TAKE 1 TABLET BY MOUTH EVERY DAY 04/29/23   [name], MD  Cholecalciferol (VITAMIN D3) 50 MCG (2000 UT) capsule Take 2,000 Units by mouth daily.    [provider]  Cyanocobalamin (VITAMIN B-12) 2500 MCG SUBL Take 1 tablet  by mouth daily. 03/21/20   [provider]  esomeprazole (NEXIUM) 20 MG capsule Take 20 mg by mouth daily at 12 noon.    [provider]  magic mouthwash (nystatin, lidocaine, diphenhydrAMINE, alum & mag hydroxide) suspension Swish and spit 5 mLs 3 (three) times daily as needed for mouth pain. 01/12/23   Octavia Heir, NP  metFORMIN (GLUCOPHAGE) 500 MG tablet Take 1 tablet (500 mg total) by mouth 2 (two) times daily with a meal. 05/21/23   [name], MD  Polyethyl Glycol-Propyl Glycol (SYSTANE) 0.4-0.3 % SOLN Apply to eye daily.    [provider]  psyllium (METAMUCIL) 58.6 % packet Take 1 packet by mouth daily.    [provider]  rosuvastatin (CRESTOR) 20 MG tablet TAKE 1 TABLET BY MOUTH EVERY DAY 04/29/23   [name], MD  Turmeric (QC TUMERIC COMPLEX) 500 MG CAPS Take 500 mg by mouth daily.    [provider]  vitamin C (ASCORBIC ACID) 500 MG tablet Take 500 mg by mouth daily.    [provider]      Allergies    Amoxicillin and Zithromax [azithromycin]    Review of Systems   Review of Systems  All other systems reviewed and are negative.   Physical Exam Updated Vital Signs BP 122/73   Pulse 77   Temp 99.6 F (37.6 C) (Oral) Comment: pts states her baseline is 97  Resp  20   SpO2 100%  Physical Exam Vitals and nursing note reviewed.  Constitutional:      Appearance: She is well-developed.  HENT:     Head: Atraumatic.  Cardiovascular:     Rate and Rhythm: Normal rate.  Pulmonary:     Effort: Pulmonary effort is normal.  Abdominal:     Tenderness: There is abdominal tenderness. There is guarding. There is no rebound.     Comments: Patient has bilateral lower quadrant abdominal tenderness, there is guarding noted over the left lower quadrant, and right lower quadrant  Musculoskeletal:     Cervical back: Normal range of motion and neck supple.  Skin:    General: Skin is warm and dry.  Neurological:     Mental  Status: She is alert and oriented to person, place, and time.     ED Results / Procedures / Treatments   Labs (all labs ordered are listed, but only abnormal results are displayed) Labs Reviewed  COMPREHENSIVE METABOLIC PANEL - Abnormal; Notable for the following components:      Result Value   Glucose, Bld 154 (*)    All other components within normal limits  CBC WITH DIFFERENTIAL/PLATELET    EKG None  Radiology CT ABDOMEN PELVIS W CONTRAST  Result Date: 05/30/2023 CLINICAL DATA:  Diverticulitis.  Lower abdominal pain since Friday EXAM: CT ABDOMEN AND PELVIS WITH CONTRAST TECHNIQUE: Multidetector CT imaging of the abdomen and pelvis was performed using the standard protocol following bolus administration of intravenous contrast. RADIATION DOSE REDUCTION: This exam was performed according to the departmental dose-optimization program which includes automated exposure control, adjustment of the mA and/or kV according to patient size and/or use of iterative reconstruction technique. CONTRAST:  OMNIPAQUE IOHEXOL 300 MG/ML  SOLN COMPARISON:  CT angiogram 05/17/2023 FINDINGS: Lower chest: Slight linear opacity lung bases likely scar or atelectasis. No pleural effusion. Coronary artery calcifications are seen. Hepatobiliary: Focal fat deposition seen in the liver adjacent to the falciform ligament. Patent portal vein. Stone in the nondilated gallbladder dependently. Pancreas: Unremarkable. No pancreatic ductal dilatation or surrounding inflammatory changes. Spleen: Normal in size without focal abnormality. Adrenals/Urinary Tract: Right adrenal gland is preserved. There is a left adrenal nodule identified. Diameter of 11 mm on series 2, image 19. Hounsfield unit on portal venous phase of 63 and on the delayed renal dataset 52. Not clearly an adenoma. Upper pole left-sided renal cystic focus identified has a thin septation. Overall this 2.5 cm lesion is a Bosniak 2 lesion. No specific imaging  follow up. Also a tiny focus along the upper pole of the right kidney under cm, additional Bosniak 2 lesion and there is a exophytic lower pole focus which is similar on the right. No specific imaging follow-up of these lesions. The ureters have normal course and caliber down to the bladder. Stomach/Bowel: On this non oral contrast exam the stomach is underdistended. Small bowel is nondilated. Large bowel has scattered stool. Large bowel is nondilated. Normal appendix in the right lower quadrant. Extensive scattered colonic diverticula greatest of the sigmoid colon. The sigmoid colon has a focal area of wall thickening with stranding such as series 2, image 61 consistent with a subtle area of diverticulitis. No complicating features of the time of the obstruction, free air or rim enhancing fluid collection. Vascular/Lymphatic: Diffuse vascular calcifications along the aorta. Greatest at the aortic bifurcation with moderate stenosis suggested. Significant calcified plaque along the common iliac and internal iliac arteries. Please correlate with any symptoms of claudication.  Normal caliber IVC. No discrete abnormal lymph node enlargement identified in the abdomen and pelvis. Reproductive: Dystrophic calcifications near the margin of the fundus of the uterus, possible calcified fibroids. No separate adnexal mass. Other: No free air or free fluid. Slight mesenteric haziness, nonspecific. Musculoskeletal: Scattered degenerative changes of the spine and pelvis. Pars defects in the lateral right on the L5 level. No listhesis. IMPRESSION: Focal area wall thickening with stranding and diverticula along the distal sigmoid colon consistent with diverticulitis. No complicating features. Recommend follow up to confirm resolution and exclude secondary pathology. Scattered diffuse colonic diverticulosis elsewhere along the colon itself. Gallstone. Significant calcified plaque along the aorta and iliac vessels with areas of  significant stenosis suggested along the aortic bifurcation. Please correlate with any symptoms. Left adrenal nodule. Not clearly an adenoma. Please correlate with any remote prior or dedicated workup when appropriate with either washout CT or MRI. Electronically Signed   By: Karen Kays M.D.   On: 05/30/2023 10:54    Procedures Procedures    Medications Ordered in ED Medications  ciprofloxacin (CIPRO) tablet 500 mg (has no administration in time range)  metroNIDAZOLE (FLAGYL) tablet 500 mg (has no administration in time range)  iohexol (OMNIPAQUE) 300 MG/ML solution 100 mL (100 mLs Intravenous Contrast Given 05/30/23 1027)    ED Course/ Medical Decision Making/ A&P                                 Medical Decision Making Amount and/or Complexity of Data Reviewed Labs: ordered. Radiology: ordered.  Risk Prescription drug management.   77 year old patient comes in with chief complaint of abdominal pain. She has history of hypertension, hyperlipidemia, diverticulosis.  I have reviewed patient's records including primary care doctor note and vascular surgery note, where patient was found to have iliac artery stenosis/PAD.  On exam, patient has significant abdominal tenderness.  Differential diagnosis for her includes acute diverticulitis, diverticulitis with complications such as microperforation, abscess and also acute appendicitis.  Clinically, low suspicion for acute cystitis.  It also does not appear that patient is having mesenteric ischemia, but colitis is in the differential given cramping nature of the pain that is intermittent.  Plan is to get CT abdomen pelvis with contrast and then reassess the patient.  11:03 AM CT independently interpreted.  There is evidence of diverticulitis.  No complication like perforation noted.  Stable for discharge.  Results discussed with the patient.  Final Clinical Impression(s) / ED Diagnoses Final diagnoses:  Diverticulitis    Rx / DC  Orders ED Discharge Orders          Ordered    ciprofloxacin (CIPRO) 500 MG tablet  Every 12 hours        05/30/23 1102    metroNIDAZOLE (FLAGYL) 500 MG tablet  2 times daily        05/30/23 1102              Derwood Kaplan, MD 05/30/23 1103

## 2023-05-30 NOTE — ED Notes (Signed)
Transport to CT

## 2023-06-01 ENCOUNTER — Other Ambulatory Visit: Payer: Self-pay

## 2023-06-01 DIAGNOSIS — I70212 Atherosclerosis of native arteries of extremities with intermittent claudication, left leg: Secondary | ICD-10-CM

## 2023-06-20 ENCOUNTER — Encounter: Payer: Self-pay | Admitting: Vascular Surgery

## 2023-06-28 ENCOUNTER — Encounter (HOSPITAL_COMMUNITY): Payer: Self-pay | Admitting: Vascular Surgery

## 2023-06-28 ENCOUNTER — Other Ambulatory Visit: Payer: Self-pay

## 2023-06-28 ENCOUNTER — Encounter (HOSPITAL_COMMUNITY)
Admission: RE | Disposition: A | Discharge status: Home / Self Care | Payer: Self-pay | Source: Home / Self Care | Attending: Vascular Surgery

## 2023-06-28 ENCOUNTER — Ambulatory Visit (HOSPITAL_COMMUNITY)
Admission: RE | Admit: 2023-06-28 | Discharge: 2023-06-28 | Disposition: A | Discharge status: Home / Self Care | Payer: BLUE CROSS/BLUE SHIELD | Attending: Vascular Surgery | Admitting: Vascular Surgery

## 2023-06-28 DIAGNOSIS — Z7984 Long term (current) use of oral hypoglycemic drugs: Secondary | ICD-10-CM | POA: Insufficient documentation

## 2023-06-28 DIAGNOSIS — Z79899 Other long term (current) drug therapy: Secondary | ICD-10-CM | POA: Insufficient documentation

## 2023-06-28 DIAGNOSIS — Z87891 Personal history of nicotine dependence: Secondary | ICD-10-CM | POA: Insufficient documentation

## 2023-06-28 DIAGNOSIS — I700 Atherosclerosis of aorta: Secondary | ICD-10-CM

## 2023-06-28 DIAGNOSIS — K219 Gastro-esophageal reflux disease without esophagitis: Secondary | ICD-10-CM | POA: Insufficient documentation

## 2023-06-28 DIAGNOSIS — I708 Atherosclerosis of other arteries: Secondary | ICD-10-CM | POA: Diagnosis not present

## 2023-06-28 DIAGNOSIS — E7800 Pure hypercholesterolemia, unspecified: Secondary | ICD-10-CM | POA: Insufficient documentation

## 2023-06-28 DIAGNOSIS — Z7982 Long term (current) use of aspirin: Secondary | ICD-10-CM | POA: Diagnosis not present

## 2023-06-28 DIAGNOSIS — I77819 Aortic ectasia, unspecified site: Secondary | ICD-10-CM

## 2023-06-28 DIAGNOSIS — Z853 Personal history of malignant neoplasm of breast: Secondary | ICD-10-CM | POA: Insufficient documentation

## 2023-06-28 DIAGNOSIS — I10 Essential (primary) hypertension: Secondary | ICD-10-CM | POA: Diagnosis not present

## 2023-06-28 DIAGNOSIS — R7303 Prediabetes: Secondary | ICD-10-CM | POA: Insufficient documentation

## 2023-06-28 DIAGNOSIS — Z9013 Acquired absence of bilateral breasts and nipples: Secondary | ICD-10-CM | POA: Insufficient documentation

## 2023-06-28 DIAGNOSIS — I70212 Atherosclerosis of native arteries of extremities with intermittent claudication, left leg: Secondary | ICD-10-CM | POA: Diagnosis not present

## 2023-06-28 DIAGNOSIS — Z800 Family history of malignant neoplasm of digestive organs: Secondary | ICD-10-CM | POA: Diagnosis not present

## 2023-06-28 HISTORY — PX: ABDOMINAL AORTOGRAM W/LOWER EXTREMITY: CATH118223

## 2023-06-28 LAB — POCT I-STAT, CHEM 8
BUN: 17 mg/dL (ref 8–23)
Calcium, Ion: 1.25 mmol/L (ref 1.15–1.40)
Chloride: 105 mmol/L (ref 98–111)
Creatinine, Ser: 0.6 mg/dL (ref 0.44–1.00)
Glucose, Bld: 139 mg/dL — ABNORMAL HIGH (ref 70–99)
HCT: 34 % — ABNORMAL LOW (ref 36.0–46.0)
Hemoglobin: 11.6 g/dL — ABNORMAL LOW (ref 12.0–15.0)
Potassium: 3.8 mmol/L (ref 3.5–5.1)
Sodium: 143 mmol/L (ref 135–145)
TCO2: 23 mmol/L (ref 22–32)

## 2023-06-28 LAB — GLUCOSE, CAPILLARY: Glucose-Capillary: 139 mg/dL — ABNORMAL HIGH (ref 70–99)

## 2023-06-28 SURGERY — ABDOMINAL AORTOGRAM W/LOWER EXTREMITY
Anesthesia: LOCAL

## 2023-06-28 MED ORDER — OXYCODONE HCL 5 MG PO TABS: 5.0000 mg | ORAL_TABLET | ORAL | Status: DC | PRN

## 2023-06-28 MED ORDER — MIDAZOLAM HCL 2 MG/2ML IJ SOLN
INTRAMUSCULAR | Status: AC
  Filled 2023-06-28: qty 2

## 2023-06-28 MED ORDER — MORPHINE SULFATE (PF) 2 MG/ML IV SOLN: 2.0000 mg | INTRAVENOUS | Status: DC | PRN

## 2023-06-28 MED ORDER — SODIUM CHLORIDE 0.9% FLUSH: 3.0000 mL | INTRAVENOUS | Status: DC | PRN

## 2023-06-28 MED ORDER — HEPARIN (PORCINE) IN NACL 1000-0.9 UT/500ML-% IV SOLN
INTRAVENOUS | Status: DC | PRN
  Administered 2023-06-28 (×2): 500 mL via INTRA_ARTERIAL

## 2023-06-28 MED ORDER — IODIXANOL 320 MG/ML IV SOLN
INTRAVENOUS | Status: DC | PRN
  Administered 2023-06-28: 70 mL via INTRA_ARTERIAL

## 2023-06-28 MED ORDER — CLOPIDOGREL BISULFATE 75 MG PO TABS: 75.0000 mg | ORAL_TABLET | Freq: Every day | ORAL | 11 refills | Status: DC

## 2023-06-28 MED ORDER — CLOPIDOGREL BISULFATE 75 MG PO TABS: 75.0000 mg | ORAL_TABLET | Freq: Every day | ORAL | Status: DC

## 2023-06-28 MED ORDER — CLOPIDOGREL BISULFATE 300 MG PO TABS
ORAL_TABLET | ORAL | Status: DC | PRN
  Administered 2023-06-28: 300 mg via ORAL

## 2023-06-28 MED ORDER — LIDOCAINE HCL (PF) 1 % IJ SOLN
INTRAMUSCULAR | Status: AC
  Filled 2023-06-28: qty 30

## 2023-06-28 MED ORDER — ACETAMINOPHEN 325 MG PO TABS: 650.0000 mg | ORAL_TABLET | ORAL | Status: DC | PRN

## 2023-06-28 MED ORDER — SODIUM CHLORIDE 0.9% FLUSH: 3.0000 mL | Freq: Two times a day (BID) | INTRAVENOUS | Status: DC

## 2023-06-28 MED ORDER — HEPARIN SODIUM (PORCINE) 1000 UNIT/ML IJ SOLN
INTRAMUSCULAR | Status: AC
  Filled 2023-06-28: qty 10

## 2023-06-28 MED ORDER — CLOPIDOGREL BISULFATE 300 MG PO TABS
ORAL_TABLET | ORAL | Status: AC
  Filled 2023-06-28: qty 1

## 2023-06-28 MED ORDER — FENTANYL CITRATE (PF) 100 MCG/2ML IJ SOLN
INTRAMUSCULAR | Status: AC
  Filled 2023-06-28: qty 2

## 2023-06-28 MED ORDER — HEPARIN SODIUM (PORCINE) 1000 UNIT/ML IJ SOLN
INTRAMUSCULAR | Status: DC | PRN
  Administered 2023-06-28: 5000 [IU] via INTRAVENOUS

## 2023-06-28 MED ORDER — SODIUM CHLORIDE 0.9 % IV SOLN: INTRAVENOUS | Status: DC

## 2023-06-28 MED ORDER — CLOPIDOGREL BISULFATE 75 MG PO TABS: 300.0000 mg | ORAL_TABLET | Freq: Once | ORAL | Status: DC

## 2023-06-28 MED ORDER — LIDOCAINE HCL (PF) 1 % IJ SOLN
INTRAMUSCULAR | Status: DC | PRN
  Administered 2023-06-28: 10 mL

## 2023-06-28 MED ORDER — MIDAZOLAM HCL 2 MG/2ML IJ SOLN
INTRAMUSCULAR | Status: DC | PRN
  Administered 2023-06-28 (×2): 1 mg via INTRAVENOUS

## 2023-06-28 MED ORDER — SODIUM CHLORIDE 0.9 % IV SOLN: 250.0000 mL | INTRAVENOUS | Status: DC | PRN

## 2023-06-28 MED ORDER — ONDANSETRON HCL 4 MG/2ML IJ SOLN: 4.0000 mg | Freq: Four times a day (QID) | INTRAMUSCULAR | Status: DC | PRN

## 2023-06-28 MED ORDER — SODIUM CHLORIDE 0.9 % WEIGHT BASED INFUSION: 1.0000 mL/kg/h | INTRAVENOUS | Status: DC

## 2023-06-28 MED ORDER — HYDRALAZINE HCL 20 MG/ML IJ SOLN: 5.0000 mg | INTRAMUSCULAR | Status: DC | PRN

## 2023-06-28 MED ORDER — FENTANYL CITRATE (PF) 100 MCG/2ML IJ SOLN
INTRAMUSCULAR | Status: DC | PRN
  Administered 2023-06-28 (×2): 25 ug via INTRAVENOUS

## 2023-06-28 MED ORDER — LABETALOL HCL 5 MG/ML IV SOLN: 10.0000 mg | INTRAVENOUS | Status: DC | PRN

## 2023-06-28 SURGICAL SUPPLY — 18 items
BALLN MUSTANG 9X40X75 (BALLOONS) ×1
BALLOON MUSTANG 9X40X75 (BALLOONS) IMPLANT
CATH OMNI FLUSH 5F 65CM (CATHETERS) IMPLANT
CLOSURE MYNX CONTROL 6F/7F (Vascular Products) IMPLANT
COVER DOME SNAP 22 D (MISCELLANEOUS) IMPLANT
GLIDEWIRE ADV .035X260CM (WIRE) IMPLANT
KIT ENCORE 26 ADVANTAGE (KITS) IMPLANT
KIT MICROPUNCTURE NIT STIFF (SHEATH) IMPLANT
KIT SINGLE USE MANIFOLD (KITS) IMPLANT
KIT SYRINGE INJ CVI SPIKEX1 (MISCELLANEOUS) IMPLANT
SET ATX-X65L (MISCELLANEOUS) IMPLANT
SHEATH CATAPULT 6FR 45 (SHEATH) IMPLANT
SHEATH PINNACLE 5F 10CM (SHEATH) IMPLANT
SHEATH PINNACLE 6F 10CM (SHEATH) IMPLANT
SHEATH PROBE COVER 6X72 (BAG) IMPLANT
STENT VIABAHN 7X29 6FR 80 (Permanent Stent) IMPLANT
TRAY PV CATH (CUSTOM PROCEDURE TRAY) ×1 IMPLANT
WIRE BENTSON .035X145CM (WIRE) IMPLANT

## 2023-06-28 NOTE — Op Note (Signed)
Patient name: Victoria Wolfe MRN: [id number] DOB: Apr 13, 1946 Sex: female  06/28/2023 Pre-operative Diagnosis: Atherosclerosis with short distance life limiting left lower extremity claudication Post-operative diagnosis:  Same Surgeon:  Luanna Salk. Randie Heinz, MD Procedure Performed: 1.  Percutaneous ultrasound-guided access and Mynx device closure right common femoral artery 2.  Catheter aorta and aortogram with bilateral lower extremity angiography 3.  Selection of left common femoral artery 4.  Stent of left common iliac artery with 7 x 29 mm VBX x 2 postdilated with 9 mm balloon 5.  Moderate sedation with fentanyl and Versed for 38 minutes   Indications: 77 year old female with history of short distance claudication with hemodynamically significant lesion in the left common iliac artery as evidenced by CT scan and physical exam as well as decreased ABIs.  She is indicated for angiography with possible intervention.  Findings: Aorta proximally with renal arteries is all patent.  Distally there appears to be calcified aorta with ectasia.  The right common iliac artery is calcified held ever there is no flow-limiting lesion with a gradient of 5 mmHg there.  In the left common iliac artery there is a heavily calcified common iliac artery with a tight stenosis distally just proximal to the external iliac artery and hypogastric artery takeoff.  The lesion initially had a 70 mmHg gradient which was reduced to 0 after stenting.  Patient has proximal bilateral common femoral arteries that are patent without disease on the left side the SFA, popliteal and three-vessel runoff to the foot are all without any flow-limiting stenosis.  At completion she had a very strong palpable left common femoral artery pulse.   Procedure:  The patient was identified in the holding area and taken to room 8.  The patient was then placed supine on the table and prepped and draped in the usual sterile fashion.  A time out was called.   Ultrasound was used to evaluate the right common femoral artery.  The area was anesthetized 1% lidocaine and cannulated with a micropuncture needle followed by wire and a sheath.  An ultrasound image was saved to the permanent record.  Concomitantly we administered fentanyl and Versed as moderate sedation her vital signs were monitored throughout the case.  We then placed a Bentson wire followed by 5 [place] sheath and an Omni catheter to the level of L1 and performed aortogram followed by bilateral lower extremity runoff to include the bilateral common femoral arteries.  We then crossed the bifurcation with a Glidewire advantage and then placed an Omni catheter across this and confirmed intraluminal access.  Prior to crossing the systolic blood pressure was 90 and after crossing the systolic pressure was 20 consistent with a 90 mmHg gradient.  We then heparinized and placed a long 6 French sheath up and over the bifurcation.  We then began with stenting distally with a 7 x 29 mm VBX and extended proximally with a 7 x 29 mm VBX just short of the bifurcation and postdilated this with 9 mm balloon.  Completion demonstrated brisk flow through the stents without any flow limitation and there was no gradient distal to this at the time with a systolic pressure of 90 mmHg.  We then performed completion left lower extremity angiography which was sluggish but there was flow through all 3 vessels to the foot.  Satisfied with this we exchanged for a short 6 French sheath in the right groin and deployed a minx device.  The patient tolerated the procedure without immediate complication.  Contrast: 70 cc   Cason Dabney C. Randie Heinz, MD [facility] [place] Office: [phone] Pager: [phone]

## 2023-06-28 NOTE — Progress Notes (Signed)
Pt ambulated to and from bathroom to void with no signs of oozing from right groin site  

## 2023-06-28 NOTE — H&P (Signed)
HPI:   Victoria Wolfe is a 77 y.o. female former smoker with back and buttock pain radiating down the left greater than right lower extremity.  She is only able to walk approximately 125 yards prior to having symptoms of the blood and cramping of the left calf and thigh.  She continues on aspirin.  She does not have any tissue loss or ulceration.  She has undergone CT scan now follows up for discussion of options moving forward.       Past Medical History:  Diagnosis Date   Arthritis      Per [facility] New Patient Packet    Bilateral malignant neoplasm of breast in female [facility]) 10/18/2018   Breast cancer [facility])      Per [facility] New Patient Packet    Cataract     Essential hypertension 10/25/2015   GERD (gastroesophageal reflux disease)     High blood pressure      Per [facility] New Patient Packet    High cholesterol      Per [facility] New Patient Packet    Hyperlipidemia LDL goal <130 10/25/2015   Pneumonia     Prediabetes 11/19/2017   Shingles               Family History  Problem Relation Age of Onset   Alzheimer's disease Mother     Colon cancer Mother 9   Ulcers Father     Parkinsonism Father     Osteoporosis Sister     Stomach cancer Neg Hx               Past Surgical History:  Procedure Laterality Date   CATARACT EXTRACTION   2018    Per [facility] New Patient Packet, Dr.Todd Leventhal    CESAREAN SECTION        x2   COLONOSCOPY   2018    Per [facility] new patient packet, Dr.Locker   MASTECTOMY Bilateral 02/02/2019    Per [facility] New Patient Packet, DrMichele Blackwood and Dr.Colon    MENISCECTOMY Right 2009   MENISCUS REPAIR   2004    Per [facility] New Patient Packet, Dr. Karolee Stamps   TOENAIL TRIMMING Left      ingrown in big toe   TONSILLECTOMY   1951    Per [facility] New Patient Packet          Short Social History:  Social History         Tobacco Use   Smoking status: Former      Current packs/day: 0.00      Types: Cigarettes      Start date: 1970      Quit date: 1995      Years since  quitting: 29.7   Smokeless tobacco: Never  Substance Use Topics   Alcohol use: Not Currently      Allergies       Allergies  Allergen Reactions   Amoxicillin Rash      Had rash after taking amoxil and zithromax, not sure which one caused the rash   Zithromax [Azithromycin] Rash      Had rash after taking zithromax and amoxil at the same time, not sure which antibiotic caused the rash              Current Outpatient Medications  Medication Sig Dispense Refill   amLODipine (NORVASC) 2.5 MG tablet Take 2 tablets (5 mg total) by mouth daily.       aspirin EC (ASPIRIN ADULT LOW DOSE) 81  MG tablet Take 81 mg by mouth once.       BIOTIN 5000 PO Take 1 capsule by mouth daily.       calcium carbonate (OSCAL) 1500 (600 Ca) MG TABS tablet Take 1 tablet (1,500 mg total) by mouth 2 (two) times daily with a meal. 60 tablet 0   candesartan-hydrochlorothiazide (ATACAND HCT) 32-12.5 MG tablet TAKE 1 TABLET BY MOUTH EVERY DAY 90 tablet 1   Cholecalciferol (VITAMIN D3) 50 MCG (2000 UT) capsule Take 2,000 Units by mouth daily.       Cyanocobalamin (VITAMIN B-12) 2500 MCG SUBL Take 1 tablet by mouth daily.       esomeprazole (NEXIUM) 20 MG capsule Take 20 mg by mouth daily at 12 noon.       magic mouthwash (nystatin, lidocaine, diphenhydrAMINE, alum & mag hydroxide) suspension Swish and spit 5 mLs 3 (three) times daily as needed for mouth pain. 180 mL 1   metFORMIN (GLUCOPHAGE) 500 MG tablet Take 1 tablet (500 mg total) by mouth 2 (two) times daily with a meal. 180 tablet 3   Polyethyl Glycol-Propyl Glycol (SYSTANE) 0.4-0.3 % SOLN Apply to eye daily.       psyllium (METAMUCIL) 58.6 % packet Take 1 packet by mouth daily.       rosuvastatin (CRESTOR) 20 MG tablet TAKE 1 TABLET BY MOUTH EVERY DAY 90 tablet 3   Turmeric (QC TUMERIC COMPLEX) 500 MG CAPS Take 500 mg by mouth daily.       vitamin C (ASCORBIC ACID) 500 MG tablet Take 500 mg by mouth daily.          No current facility-administered  medications for this visit.        Review of Systems  Constitutional:  Constitutional negative. HENT: HENT negative.  Eyes: Eyes negative.  Cardiovascular: Positive for claudication.  GI: Gastrointestinal negative.  Musculoskeletal: Musculoskeletal negative.  Skin: Skin negative.  Neurological: Neurological negative. Hematologic: Hematologic/lymphatic negative.  Psychiatric: Psychiatric negative.          Objective:    Vitals:   06/28/23 0618  BP: 138/69  Pulse: 85  Resp: 19  Temp: 98.5 F (36.9 C)  SpO2: 97%    Physical Exam HENT:     Head: Normocephalic.     Nose: Nose normal.     Mouth/Throat:     Mouth: Mucous membranes are moist.  Eyes:     Pupils: Pupils are equal, round, and reactive to light.  Neck:     Vascular: No carotid bruit.  Cardiovascular:     Rate and Rhythm: Normal rate.     Pulses:          Femoral pulses are 1+ on the right side and 0 on the left side. Pulmonary:     Effort: Pulmonary effort is normal.  Abdominal:     General: Abdomen is flat.  Musculoskeletal:     Right lower leg: No edema.     Left lower leg: No edema.  Skin:    General: Skin is warm.  Neurological:     General: No focal deficit present.     Mental Status: She is alert.  Psychiatric:        Mood and Affect: Mood normal.        Thought Content: Thought content normal.        Judgment: Judgment normal.        Data: CTA IMPRESSION: 1. 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 2. Short-segment moderate stenosis of  the distal left common iliac artery. 3. No significant outflow or runoff disease. 4. 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 5. Cholelithiasis. 6. Colonic diverticulosis. 7. Calcified uterine fibroids. 8. 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  We reviewed her CT angio together today which does demonstrate heavy calcium burden on the left.  She has attempted to continue walking program but given the proximal nature of this disease that is certainly difficult task.  She is taking aspirin and statin.  We will plan for angiography from the right common femoral approach and likely stenting of the left common iliac artery in the near future.  We discussed the risk benefits alternatives as well as the expected need for laying flat 2 to 4 hours after the procedure and no heavy lifting for 2 weeks after surgery.  She demonstrates good understanding we will get her scheduled on Monday in the near future and she will continue aspirin and statin.  Tiya Schrupp C. Randie Heinz, MD [facility] Office: [phone] Pager: [phone]

## 2023-06-29 ENCOUNTER — Telehealth: Payer: Self-pay | Admitting: *Deleted

## 2023-06-29 NOTE — Progress Notes (Signed)
[facility] nurse called to ask about firm, painful place on patient's R inner thigh. Instructed her to hold pressure and inform vein and vascular office. Number given.

## 2023-06-29 NOTE — Telephone Encounter (Signed)
Danielle from [facility] called stating patient had surgery yesterday and has developed a hematoma in her right groin they are holding pressure to the site and lying patient flat. They are requesting any further orders. She states they have also applied ice to the area. Spoke with Corrie PA she states they are doing right thing and if it gets worse send patient to the ER.Danielle verbalized understanding of all instructions. Patient was supposed to go home this afternoon they are going to try and keep her overnight for observation.

## 2023-06-30 ENCOUNTER — Encounter: Payer: Self-pay | Admitting: Vascular Surgery

## 2023-07-10 ENCOUNTER — Encounter: Payer: Self-pay | Admitting: Internal Medicine

## 2023-07-12 MED ORDER — AMLODIPINE BESYLATE 5 MG PO TABS: 5.0000 mg | ORAL_TABLET | Freq: Every day | ORAL | 3 refills | Status: DC

## 2023-07-12 NOTE — Telephone Encounter (Signed)
Patient requested refill °Pended Rx and sent to Dr. Gupta for approval.  °

## 2023-07-16 ENCOUNTER — Other Ambulatory Visit: Payer: Self-pay | Admitting: *Deleted

## 2023-07-16 DIAGNOSIS — I70212 Atherosclerosis of native arteries of extremities with intermittent claudication, left leg: Secondary | ICD-10-CM

## 2023-07-29 ENCOUNTER — Ambulatory Visit (INDEPENDENT_AMBULATORY_CARE_PROVIDER_SITE_OTHER)
Admit: 2023-07-29 | Discharge: 2023-07-29 | Disposition: A | Discharge status: Home / Self Care | Payer: BLUE CROSS/BLUE SHIELD | Attending: Vascular Surgery | Admitting: Vascular Surgery

## 2023-07-29 ENCOUNTER — Ambulatory Visit (HOSPITAL_COMMUNITY)
Admission: RE | Admit: 2023-07-29 | Discharge: 2023-07-29 | Disposition: A | Discharge status: Home / Self Care | Payer: BLUE CROSS/BLUE SHIELD | Source: Ambulatory Visit | Attending: Vascular Surgery | Admitting: Vascular Surgery

## 2023-07-29 DIAGNOSIS — I70212 Atherosclerosis of native arteries of extremities with intermittent claudication, left leg: Secondary | ICD-10-CM

## 2023-07-29 LAB — VAS US ABI WITH/WO TBI
Left ABI: 1.25
Right ABI: 1.19

## 2023-08-04 ENCOUNTER — Ambulatory Visit (INDEPENDENT_AMBULATORY_CARE_PROVIDER_SITE_OTHER): Payer: BLUE CROSS/BLUE SHIELD | Admitting: Physician Assistant

## 2023-08-04 VITALS — BP 144/79 | HR 79 | Temp 97.6°F | Ht 66.0 in | Wt 180.8 lb

## 2023-08-04 DIAGNOSIS — I70212 Atherosclerosis of native arteries of extremities with intermittent claudication, left leg: Secondary | ICD-10-CM | POA: Diagnosis not present

## 2023-08-04 NOTE — Progress Notes (Signed)
Office Note   History of Present Illness   Victoria Wolfe is a 77 y.o. (12/22/1945) female who presents for postop visit.  She recently underwent stenting of the left common iliac artery on 06/28/2023 by Dr. Randie Heinz.  This was done for lifestyle limiting left lower extremity claudication.  The patient returns today for follow-up.  She states her claudication in the left leg is now completely resolved.  She can now walk freely without any pain.  She also denies any rest pain or lower extremity tissue loss.  She is tolerating her aspirin, Plavix, and statin.  Current Outpatient Medications  Medication Sig Dispense Refill   amLODipine (NORVASC) 5 MG tablet Take 1 tablet (5 mg total) by mouth daily. 90 tablet 3   aspirin EC (ASPIRIN ADULT LOW DOSE) 81 MG tablet Take 81 mg by mouth once.     BIOTIN 5000 PO Take 10,000 capsules by mouth daily.     calcium carbonate (OSCAL) 1500 (600 Ca) MG TABS tablet Take 1 tablet (1,500 mg total) by mouth 2 (two) times daily with a meal. (Patient taking differently: Take 1,200 mg of elemental calcium by mouth daily with breakfast.) 60 tablet 0   candesartan-hydrochlorothiazide (ATACAND HCT) 32-12.5 MG tablet TAKE 1 TABLET BY MOUTH EVERY DAY 90 tablet 1   cholecalciferol (VITAMIN D3) 25 MCG (1000 UNIT) tablet Take 1,000 Units by mouth daily.     clopidogrel (PLAVIX) 75 MG tablet Take 1 tablet (75 mg total) by mouth daily. 30 tablet 11   Cyanocobalamin (VITAMIN B-12) 1000 MCG SUBL Take 1,000 mcg by mouth daily.     esomeprazole (NEXIUM) 20 MG capsule Take 20 mg by mouth daily at 12 noon.     metFORMIN (GLUCOPHAGE) 500 MG tablet Take 1 tablet (500 mg total) by mouth 2 (two) times daily with a meal. 180 tablet 3   Polyethyl Glycol-Propyl Glycol (SYSTANE) 0.4-0.3 % SOLN Place 1 drop into both eyes every morning.     psyllium (METAMUCIL) 58.6 % packet Take 1 packet by mouth daily.     rosuvastatin (CRESTOR) 20 MG tablet TAKE 1 TABLET BY MOUTH EVERY DAY 90 tablet 3    Saline 0.9 % AERS Place 1 spray into the nose daily at 2 am. Simply Saline     TURMERIC PO Take 538 mg by mouth daily.     vitamin C (ASCORBIC ACID) 500 MG tablet Take 500 mg by mouth daily.     magic mouthwash (nystatin, lidocaine, diphenhydrAMINE, alum & mag hydroxide) suspension Swish and spit 5 mLs 3 (three) times daily as needed for mouth pain. (Patient not taking: Reported on 06/22/2023) 180 mL 1   No current facility-administered medications for this visit.    REVIEW OF SYSTEMS (negative unless checked):   Cardiac:  []  Chest pain or chest pressure? []  Shortness of breath upon activity? []  Shortness of breath when lying flat? []  Irregular heart rhythm?  Vascular:  []  Pain in calf, thigh, or hip brought on by walking? []  Pain in feet at night that wakes you up from your sleep? []  Blood clot in your veins? []  Leg swelling?  Pulmonary:  []  Oxygen at home? []  Productive cough? []  Wheezing?  Neurologic:  []  Sudden weakness in arms or legs? []  Sudden numbness in arms or legs? []  Sudden onset of difficult speaking or slurred speech? []  Temporary loss of vision in one eye? []  Problems with dizziness?  Gastrointestinal:  []  Blood in stool? []  Vomited blood?  Genitourinary:  []   Burning when urinating? []  Blood in urine?  Psychiatric:  []  Major depression  Hematologic:  []  Bleeding problems? []  Problems with blood clotting?  Dermatologic:  []  Rashes or ulcers?  Constitutional:  []  Fever or chills?  Ear/Nose/Throat:  []  Change in hearing? []  Nose bleeds? []  Sore throat?  Musculoskeletal:  []  Back pain? []  Joint pain? []  Muscle pain?   Physical Examination   Vitals:   08/04/23 1357  BP: (!) 144/79  Pulse: 79  Temp: 97.6 F (36.4 C)  TempSrc: Temporal  SpO2: 96%  Weight: 180 lb 12.8 oz (82 kg)  Height: 5\' 6"  (1.676 m)   Body mass index is 29.18 kg/m.  General:  WDWN in NAD; vital signs documented above Gait: Not observed HENT: WNL,  normocephalic Pulmonary: normal non-labored breathing , without rales, rhonchi,  wheezing Cardiac: regular Abdomen: soft, NT, no masses Skin: without rashes Vascular Exam/Pulses: Palpable femoral pulses bilaterally.  Palpable DP/PT pulses bilaterally Extremities: without ischemic changes, without gangrene , without cellulitis; without open wounds;  Musculoskeletal: no muscle wasting or atrophy  Neurologic: A&O X 3;  No focal weakness or paresthesias are detected Psychiatric:  The pt has Normal affect.  Non-Invasive Vascular imaging   ABI (07/29/2023) +---------+------------------+-----+---------+--------+  Right   Rt Pressure (mmHg)IndexWaveform Comment   +---------+------------------+-----+---------+--------+  Brachial 126                                       +---------+------------------+-----+---------+--------+  PTA     136               1.08 triphasic          +---------+------------------+-----+---------+--------+  DP      150               1.19 triphasic          +---------+------------------+-----+---------+--------+  [facility]               0.89 Normal             +---------+------------------+-----+---------+--------+   +---------+------------------+-----+---------+-------+  Left    Lt Pressure (mmHg)IndexWaveform Comment  +---------+------------------+-----+---------+-------+  Brachial 126                                      +---------+------------------+-----+---------+-------+  PTA     143               1.13 triphasic         +---------+------------------+-----+---------+-------+  DP      158               1.25 triphasic         +---------+------------------+-----+---------+-------+  [place]               0.92 Normal            +---------+------------------+-----+---------+-------+   +-------+-----------+-----------+------------+------------+  ABI/TBIToday's ABIToday's TBIPrevious ABIPrevious  TBI  +-------+-----------+-----------+------------+------------+  Right 1.19       0.89       0.88        0.80          +-------+-----------+-----------+------------+------------+  Left  1.25       0.92       0.77        0.63          +-------+-----------+-----------+------------+------------+   Left  Aortoiliac Duplex (07/29/2023) Patent left common iliac artery stent without stenosis    Medical Decision Making   Victoria Wolfe is a 77 y.o. female who presents for postop visit  Based on the patient's vascular studies, her ABIs in the left are greatly improved from 0.77 to 1.25.  Her ABIs on the right are also improved from 0.88 to 1.19 Arterial duplex demonstrates a patent left common iliac artery stent without stenosis The patient states that her left lower extremity claudication has completely resolved since stenting.  She also denies any rest pain or tissue loss On exam she has palpable femoral pulses bilaterally.  She also has palpable DP/PT pulses bilaterally I have encouraged her to continue her aspirin, Plavix, statin.  She can follow-up with our office in 6 months with repeat ABIs and left aortoiliac duplex   Loel Dubonnet PA-C [facility] Office: [phone]  Clinic MD: Randie Heinz

## 2023-08-05 ENCOUNTER — Other Ambulatory Visit: Payer: Self-pay

## 2023-08-05 DIAGNOSIS — I70212 Atherosclerosis of native arteries of extremities with intermittent claudication, left leg: Secondary | ICD-10-CM

## 2023-09-14 DIAGNOSIS — E785 Hyperlipidemia, unspecified: Secondary | ICD-10-CM | POA: Diagnosis not present

## 2023-09-14 DIAGNOSIS — E119 Type 2 diabetes mellitus without complications: Secondary | ICD-10-CM | POA: Diagnosis not present

## 2023-09-14 DIAGNOSIS — I10 Essential (primary) hypertension: Secondary | ICD-10-CM | POA: Diagnosis not present

## 2023-09-14 LAB — HEMOGLOBIN A1C: Hemoglobin A1C: 6.4

## 2023-09-14 LAB — HEPATIC FUNCTION PANEL
ALT: 48 U/L — AB (ref 7–35)
AST: 36 — AB (ref 13–35)
Alkaline Phosphatase: 81 (ref 25–125)
Bilirubin, Direct: 0.2
Bilirubin, Total: 0.3

## 2023-09-14 LAB — LIPID PANEL
Cholesterol: 173 (ref 0–200)
HDL: 59 (ref 35–70)
LDL Cholesterol: 95
LDl/HDL Ratio: 2.9
Triglycerides: 95 (ref 40–160)

## 2023-09-14 LAB — COMPREHENSIVE METABOLIC PANEL: Albumin: 4.5 (ref 3.5–5.0)

## 2023-09-20 ENCOUNTER — Non-Acute Institutional Stay: Payer: BLUE CROSS/BLUE SHIELD | Admitting: Adult Health

## 2023-09-20 ENCOUNTER — Encounter: Payer: Self-pay | Admitting: Adult Health

## 2023-09-20 VITALS — BP 140/80 | HR 75 | Temp 97.8°F | Resp 17 | Ht 66.0 in | Wt 179.8 lb

## 2023-09-20 DIAGNOSIS — M8580 Other specified disorders of bone density and structure, unspecified site: Secondary | ICD-10-CM

## 2023-09-20 DIAGNOSIS — E1159 Type 2 diabetes mellitus with other circulatory complications: Secondary | ICD-10-CM

## 2023-09-20 DIAGNOSIS — I739 Peripheral vascular disease, unspecified: Secondary | ICD-10-CM | POA: Diagnosis not present

## 2023-09-20 DIAGNOSIS — Z780 Asymptomatic menopausal state: Secondary | ICD-10-CM | POA: Diagnosis not present

## 2023-09-20 DIAGNOSIS — R002 Palpitations: Secondary | ICD-10-CM | POA: Diagnosis not present

## 2023-09-20 DIAGNOSIS — E785 Hyperlipidemia, unspecified: Secondary | ICD-10-CM | POA: Diagnosis not present

## 2023-09-20 DIAGNOSIS — I10 Essential (primary) hypertension: Secondary | ICD-10-CM

## 2023-09-20 DIAGNOSIS — R7401 Elevation of levels of liver transaminase levels: Secondary | ICD-10-CM | POA: Diagnosis not present

## 2023-09-20 MED ORDER — CALCIUM CARBONATE 1500 (600 CA) MG PO TABS: 1200.0000 mg | ORAL_TABLET | Freq: Every day | ORAL | Status: DC

## 2023-09-20 NOTE — Progress Notes (Signed)
Location [facility]   POS: clinic  Provider:  Peggye Ley, ANP [facility] [phone]    Goals of Care:     09/20/2023    1:42 PM  Advanced Directives  Does Patient Have a Medical Advance Directive? Yes  Type of Estate agent of [place];Living will  Does patient want to make changes to medical advance directive? No - Patient declined     Chief Complaint  Patient presents with   Medical Management of Chronic Issues    Patient is being seen for a 4 month follow up. Patient has had some pressure in chest   Quality Metric Gaps    Patient is due for foot exam and diabetic evaluation     HPI: Patient is a 78 y.o. female seen today for medical management of chronic diseases.    Discussed the use of AI scribe software for clinical note transcription with the patient, who gave verbal consent to proceed.  History of Present Illness   The patient, with a history of arthritis, bulging disc, and vascular disease, presents with recent episodes of chest pressure and transient dizziness. They report a significant improvement in their back and leg pain following the placement of two stents in the left iliac artery in November. The patient is currently on Plavix for blood thinning post-stent placement.  Despite the improvement in their leg and back pain, the patient has recently experienced episodes of chest pressure and momentary dizziness. These episodes occurred suddenly and resolved quickly, with no specific triggers identified. The patient reported no associated symptoms such as shortness of breath or palpitations.  The patient also reports managing their arthritic pain with Voltaren lotion as needed, primarily for their knees. They have been monitoring their blood pressure at home, which has been consistently in the 130s systolic and 70s-80s diastolic.  The patient has a history of diabetes, managed with Metformin twice daily, and hyperlipidemia,  managed with Crestor. They also report a daily alcohol intake of one glass of red wine.  The patient has not had any studies specifically looking at their coronary arteries, but they are aware of some plaque buildup in the aorta. They have not experienced any numbness or tingling in their feet.      Correction there are reports of racing heart rate or palpitations Past Medical History:  Diagnosis Date   Arthritis    Per [facility] New Patient Packet    Bilateral malignant neoplasm of breast in female [facility]) 10/18/2018   Breast cancer (HCC)    Per [facility] New Patient Packet    Cataract    Diabetes mellitus without complication (HCC)    Essential hypertension 10/25/2015   GERD (gastroesophageal reflux disease)    High blood pressure    Per [facility] New Patient Packet    High cholesterol    Per [facility] New Patient Packet    Hyperlipidemia LDL goal <130 10/25/2015   Peripheral arterial disease (HCC)    Pneumonia    Shingles     Past Surgical History:  Procedure Laterality Date   ABDOMINAL AORTOGRAM W/LOWER EXTREMITY N/A 06/28/2023   Procedure: ABDOMINAL AORTOGRAM W/LOWER EXTREMITY;  Surgeon: Maeola Harman, MD;  Location: [facility] INVASIVE CV LAB;  Service: Cardiovascular;  Laterality: N/A;   CATARACT EXTRACTION  2018   Per [facility] New Patient Packet, Dr.Todd Casper Harrison    CESAREAN SECTION     x2   COLONOSCOPY  2018   Per [facility] new patient packet, Dr.Locker   MASTECTOMY Bilateral  02/02/2019   Per [facility] New Patient Packet, DrMichele Blackwood and Dr.Colon    MENISCECTOMY Right 2009   MENISCUS REPAIR  2004   Per [facility] New Patient Packet, Dr. Karolee Stamps   TOENAIL TRIMMING Left    ingrown in big toe   TONSILLECTOMY  1951   Per [facility] New Patient Packet    Allergies  Allergen Reactions   Amoxicillin Rash    Had rash after taking amoxil and zithromax, not sure which one caused the rash   Zithromax [Azithromycin] Rash    Had rash after taking zithromax and amoxil at the same time, not sure which antibiotic  caused the rash    Outpatient Encounter Medications as of 09/20/2023  Medication Sig   amLODipine (NORVASC) 5 MG tablet Take 1 tablet (5 mg total) by mouth daily.   aspirin EC (ASPIRIN ADULT LOW DOSE) 81 MG tablet Take 81 mg by mouth once.   BIOTIN 5000 PO Take 10,000 capsules by mouth daily.   calcium carbonate (OSCAL) 1500 (600 Ca) MG TABS tablet Take 1 tablet (1,500 mg total) by mouth 2 (two) times daily with a meal. (Patient taking differently: Take 1,200 mg of elemental calcium by mouth daily with breakfast.)   candesartan-hydrochlorothiazide (ATACAND HCT) 32-12.5 MG tablet TAKE 1 TABLET BY MOUTH EVERY DAY   cholecalciferol (VITAMIN D3) 25 MCG (1000 UNIT) tablet Take 1,000 Units by mouth daily.   clopidogrel (PLAVIX) 75 MG tablet Take 1 tablet (75 mg total) by mouth daily.   Cyanocobalamin (VITAMIN B-12) 1000 MCG SUBL Take 1,000 mcg by mouth daily.   esomeprazole (NEXIUM) 20 MG capsule Take 20 mg by mouth daily at 12 noon.   metFORMIN (GLUCOPHAGE) 500 MG tablet Take 1 tablet (500 mg total) by mouth 2 (two) times daily with a meal.   Polyethyl Glycol-Propyl Glycol (SYSTANE) 0.4-0.3 % SOLN Place 1 drop into both eyes every morning.   psyllium (METAMUCIL) 58.6 % packet Take 1 packet by mouth daily.   rosuvastatin (CRESTOR) 20 MG tablet TAKE 1 TABLET BY MOUTH EVERY DAY   Saline 0.9 % AERS Place 1 spray into the nose daily at 2 am. Simply Saline   TURMERIC PO Take 538 mg by mouth daily.   vitamin C (ASCORBIC ACID) 500 MG tablet Take 500 mg by mouth daily.   magic mouthwash (nystatin, lidocaine, diphenhydrAMINE, alum & mag hydroxide) suspension Swish and spit 5 mLs 3 (three) times daily as needed for mouth pain. (Patient not taking: Reported on 06/22/2023)   No facility-administered encounter medications on file as of 09/20/2023.    Review of Systems:  Review of Systems  Constitutional:  Negative for activity change, appetite change, chills, diaphoresis, fatigue, fever and unexpected weight  change.  HENT:  Negative for congestion.   Respiratory:  Negative for cough, shortness of breath and wheezing.   Cardiovascular:  Positive for chest pain and palpitations. Negative for leg swelling.       Chest pressure   Gastrointestinal:  Negative for abdominal distention, abdominal pain, constipation and diarrhea.  Genitourinary:  Negative for difficulty urinating and dysuria.  Musculoskeletal:  Positive for arthralgias. Negative for back pain, gait problem, joint swelling and myalgias.  Neurological:  Negative for dizziness, tremors, seizures, syncope, facial asymmetry, speech difficulty, weakness, light-headedness, numbness and headaches.  Psychiatric/Behavioral:  Negative for agitation, behavioral problems and confusion.     Health Maintenance  Topic Date Due   Diabetic kidney evaluation - Urine ACR  Never done   FOOT EXAM  06/17/2023  COVID-19 Vaccine (8 - 2024-25 season) 10/03/2023 (Originally 07/22/2023)   OPHTHALMOLOGY EXAM  10/23/2023   HEMOGLOBIN A1C  03/13/2024   Medicare Annual Wellness (AWV)  05/09/2024   DTaP/Tdap/Td (2 - Td or Tdap) 08/27/2024   Diabetic kidney evaluation - eGFR measurement  09/13/2024   Pneumonia Vaccine 55+ Years old  Completed   INFLUENZA VACCINE  Completed   DEXA SCAN  Completed   Hepatitis C Screening  Completed   Zoster Vaccines- Shingrix  Completed   HPV VACCINES  Aged Out   Colonoscopy  Discontinued    Physical Exam: Vitals:   09/20/23 1338  BP: (!) 140/80  Pulse: 75  Resp: 17  Temp: 97.8 F (36.6 C)  TempSrc: Temporal  SpO2: 97%  Weight: 179 lb 12.8 oz (81.6 kg)  Height: 5\' 6"  (1.676 m)   Body mass index is 29.02 kg/m. Physical Exam Vitals and nursing note reviewed.  Constitutional:      General: She is not in acute distress.    Appearance: She is not diaphoretic.  HENT:     Head: Normocephalic and atraumatic.  Neck:     Vascular: No JVD.  Cardiovascular:     Rate and Rhythm: Normal rate and regular rhythm.      Pulses:          Dorsalis pedis pulses are 2+ on the right side and 2+ on the left side.     Heart sounds: No murmur heard. Pulmonary:     Effort: Pulmonary effort is normal. No respiratory distress.     Breath sounds: Normal breath sounds. No wheezing.  Musculoskeletal:     Right lower leg: No edema.     Left lower leg: No edema.     Right foot: Normal range of motion. No deformity.     Left foot: Normal range of motion. No deformity.  Feet:     Right foot:     Protective Sensation: 7 sites tested.  7 sites sensed.     Skin integrity: Skin integrity normal.     Left foot:     Protective Sensation: 7 sites tested.  7 sites sensed.     Skin integrity: Skin integrity normal.  Skin:    General: Skin is warm and dry.  Neurological:     Mental Status: She is alert and oriented to person, place, and time.     Labs reviewed: Basic Metabolic Panel: Recent Labs    09/24/22 0000 12/07/22 0957 05/30/23 0923 06/28/23 0606  NA 141 139 138 143  K 4.2 3.9 3.5 3.8  CL 105 104 103 105  CO2 22 25 25   --   GLUCOSE  --  153* 154* 139*  BUN 15 16 15 17   CREATININE 0.6 0.74 0.60 0.60  CALCIUM 9.9 9.9 9.4  --    Liver Function Tests: Recent Labs    12/07/22 0957 05/04/23 0000 05/30/23 0923 09/14/23 0000  AST 45* 55* 23 36*  ALT 55* 65* 31 48*  ALKPHOS 67 94 61 81  BILITOT 0.7  --  0.6  --   PROT 7.5  --  7.0  --   ALBUMIN 4.5 4.5 4.2 4.5   No results for input(s): "LIPASE", "AMYLASE" in the last 8760 hours. No results for input(s): "AMMONIA" in the last 8760 hours. CBC: Recent Labs    09/24/22 0000 12/07/22 0957 05/30/23 0923 06/28/23 0606  WBC 4.5 5.7 9.2  --   NEUTROABS  --  3.4 6.8  --   HGB  13.6 13.5 12.7 11.6*  HCT 40 38.4 36.7 34.0*  MCV  --  94.3 93.1  --   PLT 194 198 226  --    Lipid Panel: Recent Labs    09/24/22 0000 09/14/23 0000  CHOL 178 173  HDL 58 59  LDLCALC 91 95  TRIG 143 95   Lab Results  Component Value Date   HGBA1C 6.4 09/14/2023     Procedures since last visit: No results found.  Assessment/Plan  Assessment and Plan    Possible Cardiac Arrhythmia Recent episodes of transient chest pressure and dizziness, possibly related to palpitations. No prior cardiac workup. -Refer to cardiology for evaluation and possible event monitor.  Peripheral Arterial Disease Recent stenting of left iliac artery with significant improvement in symptoms. -Continue Plavix as prescribed by vascular surgeon.  Hypertension Blood pressure slightly elevated today, but generally controlled at home. -Continue current antihypertensive regimen.  Arthritis Chronic back pain and knee pain, managed with Voltaren as needed. -Continue Voltaren as needed.  Hyperlipidemia LDL controlled at 95 on Crestor. -Continue Crestor.  Osteopenia Last bone density scan in February 2024 showed T score of -1.1. -Continue current management and repeat bone density scan in 2026.  General Health Maintenance -Continue Metformin for diabetes management, with good control (HbA1c 6.4). -Continue moderate alcohol consumption, with consideration for lower alcohol content options. -Follow up in 4 months with Dr. Chales Abrahams.      Transaminitis Hx of hepatic steatosis, gallstones, etoh use Stable, no symptoms   Labs/tests ordered:  * No order type specified * CBC CMP A1C prior to next apt Next appt:  4 months     Total time :  time greater than 50% of total time spent doing pt counseling and coordination of care

## 2023-09-24 NOTE — Telephone Encounter (Signed)
Albin Felling, please follow-up on referral

## 2023-09-28 DIAGNOSIS — L600 Ingrowing nail: Secondary | ICD-10-CM | POA: Diagnosis not present

## 2023-09-28 DIAGNOSIS — L03031 Cellulitis of right toe: Secondary | ICD-10-CM | POA: Diagnosis not present

## 2023-09-28 DIAGNOSIS — M205X1 Other deformities of toe(s) (acquired), right foot: Secondary | ICD-10-CM | POA: Diagnosis not present

## 2023-09-28 DIAGNOSIS — M898X7 Other specified disorders of bone, ankle and foot: Secondary | ICD-10-CM | POA: Diagnosis not present

## 2023-09-28 DIAGNOSIS — M19072 Primary osteoarthritis, left ankle and foot: Secondary | ICD-10-CM | POA: Diagnosis not present

## 2023-09-28 DIAGNOSIS — M19071 Primary osteoarthritis, right ankle and foot: Secondary | ICD-10-CM | POA: Diagnosis not present

## 2023-09-28 DIAGNOSIS — M792 Neuralgia and neuritis, unspecified: Secondary | ICD-10-CM | POA: Diagnosis not present

## 2023-09-30 NOTE — Telephone Encounter (Signed)
 Pt has been scheduled at [facility] on 10/07/23 @ 1:30 pm.

## 2023-10-04 ENCOUNTER — Encounter: Payer: Self-pay | Admitting: Cardiology

## 2023-10-04 DIAGNOSIS — I739 Peripheral vascular disease, unspecified: Secondary | ICD-10-CM | POA: Insufficient documentation

## 2023-10-04 DIAGNOSIS — H269 Unspecified cataract: Secondary | ICD-10-CM | POA: Insufficient documentation

## 2023-10-04 DIAGNOSIS — K219 Gastro-esophageal reflux disease without esophagitis: Secondary | ICD-10-CM | POA: Insufficient documentation

## 2023-10-04 DIAGNOSIS — R059 Cough, unspecified: Secondary | ICD-10-CM

## 2023-10-04 DIAGNOSIS — M1990 Unspecified osteoarthritis, unspecified site: Secondary | ICD-10-CM | POA: Insufficient documentation

## 2023-10-04 DIAGNOSIS — B029 Zoster without complications: Secondary | ICD-10-CM | POA: Insufficient documentation

## 2023-10-04 DIAGNOSIS — J189 Pneumonia, unspecified organism: Secondary | ICD-10-CM | POA: Insufficient documentation

## 2023-10-04 HISTORY — DX: Cough, unspecified: R05.9

## 2023-10-06 NOTE — Progress Notes (Unsigned)
Cardiology Office Note:    Date:  10/07/2023   ID:  Victoria Wolfe, DOB 10-Jan-1946, MRN [id number]  PCP:  [name], MD  Cardiologist:  Norman Herrlich, MD   Referring MD: Fletcher Anon, NP  ASSESSMENT:    1. Palpitations   2. Essential hypertension   3. Hyperlipidemia LDL goal <100   4. Peripheral arterial disease (HCC)    PLAN:    In order of problems listed above:  She is having symptomatic palpitation quite suggestive of atrial arrhythmia and I suspect she is having episodes of atrial tachycardia. She is having episodes several times a week we will apply a ZIO monitor for 1 week today I think that we will be able to capture this and asked her to purchase the mobile Lourena Simmonds so she has it in the future for recurrence Avoid over-the-counter proarrhythmic drugs With her peripheral arterial disease I asked her to have an echocardiogram done she is in a high risk group Continue her current antiplatelet antihypertensive and lipid-lowering medication. She lives in [place] close to [place] and asked me to refer her to one of my colleagues she will have her echocardiogram done and [place] I will ask Dr. Tawanna Cooler if she will see her in follow-up in about 4 weeks  Next appointment 4 weeks   Medication Adjustments/Labs and Tests Ordered: Current medicines are reviewed at length with the patient today.  Concerns regarding medicines are outlined above.  Orders Placed This Encounter  Procedures   EKG 12-Lead   No orders of the defined types were placed in this encounter.    No chief complaint on file.   History of Present Illness:    Victoria Wolfe is a 78 y.o. female with a history of type 2 diabetes hypertension high cholesterol and peripheral arterial disease.  Who is being seen today for the evaluation of palpitation at the request of Fletcher Anon, NP. She has a history of claudication peripheral arterial disease and underwent PCI and stent to left common iliac artery  06/28/2023. She was seen by [facility] in adult medicine Fletcher Anon NP 09/20/2023 reporting more frequent episodes of chest pain and dizziness.  There was concern for cardiac arrhythmia and was referred to cardiology.  She is a very interesting woman she was engaged in education and [employer] and I am a product of [employer] education She has peripheral arterial disease is remarkably improved after PCI she walks every day and has no claudication chest pain edema shortness of breath. She never had arrhythmia but in the last weeks after her PCI and stent she is aware of her heart beating at beats rapidly perhaps a few minutes on a few occasions made her breathless and 1 occasion in [employer] dizzy and she was afraid she would faint Her concern is that she has atrial fibrillation She has no known heart disease congenital rheumatic or arrhythmia She takes no over-the-counter proarrhythmic drugs. She is on good background therapy for atherosclerosis with dual antiplatelet aspirin and clopidogrel along with a high intensity statin and her antihypertensive agent.  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   Arthritis    Per [facility] New Patient Packet    B12 deficiency 03/20/2020   Back pain 09/28/2022   Bilateral malignant neoplasm of breast in female [facility]) 10/18/2018   Breast cancer [facility])    Per [facility] New Patient Packet  Cancer of overlapping sites of left breast (HCC) 10/18/2018   Cataract    Cough 10/04/2023   Diabetes mellitus without complication (HCC)    Diverticulosis 05/10/2019   Elevated LFTs 09/15/2021   Essential hypertension 10/25/2015   Former smoker 05/18/2023   GERD (gastroesophageal reflux disease)    Hepatic steatosis 05/18/2023   High blood pressure    Per [facility] New Patient Packet    High cholesterol    Per [facility]  New Patient Packet    History of colon polyps 05/10/2019   Hyperlipidemia LDL goal <100 10/25/2015   Hyperlipidemia LDL goal <130 10/25/2015   Osteopenia 10/07/2022   Peripheral arterial disease (HCC)    Pneumonia    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   Shingles    Type 2 diabetes mellitus without complication, without long-term current use of insulin (HCC) 09/15/2021   Vitamin D deficiency 09/15/2021    Past Surgical History:  Procedure Laterality Date   ABDOMINAL AORTOGRAM W/LOWER EXTREMITY N/A 06/28/2023   Procedure: ABDOMINAL AORTOGRAM W/LOWER EXTREMITY;  Surgeon: Maeola Harman, MD;  Location: [facility] INVASIVE CV LAB;  Service: Cardiovascular;  Laterality: N/A;   CATARACT EXTRACTION  2018   Per [facility] New Patient Packet, Dr.Todd Casper Harrison    CESAREAN SECTION     x2   COLONOSCOPY  2018   Per [facility] new patient packet, Dr.Locker   ILIAC ARTERY STENT N/A    2   MASTECTOMY Bilateral 02/02/2019   Per [facility] New Patient Packet, DrMichele Blackwood and Dr.Colon    MENISCECTOMY Right 2009   MENISCUS REPAIR  2004   Per [facility] New Patient Packet, Dr. Karolee Stamps   TOENAIL TRIMMING Left    ingrown in big toe   TONSILLECTOMY  1951   Per [facility] New Patient Packet    Current Medications: Current Meds  Medication Sig   amLODipine (NORVASC) 5 MG tablet Take 1 tablet (5 mg total) by mouth daily.   aspirin EC (ASPIRIN ADULT LOW DOSE) 81 MG tablet Take 81 mg by mouth once.   BIOTIN 5000 PO Take 10,000 capsules by mouth daily.   calcium carbonate (OSCAL) 1500 (600 Ca) MG TABS tablet Take 2 tablets (3,000 mg total) by mouth daily with breakfast.   candesartan-hydrochlorothiazide (ATACAND HCT) 32-12.5 MG tablet TAKE 1 TABLET BY MOUTH EVERY DAY   cholecalciferol (VITAMIN D3) 25 MCG (1000 UNIT) tablet Take 1,000 Units by mouth daily.   clopidogrel (PLAVIX) 75 MG tablet Take 1 tablet (75 mg total) by mouth daily.   Cyanocobalamin  (VITAMIN B-12) 1000 MCG SUBL Take 1,000 mcg by mouth daily.   esomeprazole (NEXIUM) 20 MG capsule Take 20 mg by mouth daily at 12 noon.   metFORMIN (GLUCOPHAGE) 500 MG tablet Take 1 tablet (500 mg total) by mouth 2 (two) times daily with a meal.   Polyethyl Glycol-Propyl Glycol (SYSTANE) 0.4-0.3 % SOLN Place 1 drop into both eyes every morning.   psyllium (METAMUCIL) 58.6 % packet Take 1 packet by mouth daily.   rosuvastatin (CRESTOR) 20 MG tablet TAKE 1 TABLET BY MOUTH EVERY DAY   Saline 0.9 % AERS Place 1 spray into the nose daily at 2 am. Simply Saline   TURMERIC PO Take 538 mg by mouth daily.   vitamin C (ASCORBIC ACID) 500 MG tablet Take 500 mg by mouth daily.     Allergies:   Amoxicillin and Zithromax [azithromycin]   Social History   Socioeconomic History   Marital status: Divorced  Spouse name: Not on file   Number of children: 2   Years of education: Not on file   Highest education level: Not on file  Occupational History   Not on file  Tobacco Use   Smoking status: Former    Current packs/day: 0.00    Types: Cigarettes    Start date: 81    Quit date: 65    Years since quitting: 30.1   Smokeless tobacco: Never  Vaping Use   Vaping status: Never Used  Substance and Sexual Activity   Alcohol use: Not Currently   Drug use: Not Currently   Sexual activity: Not Currently    Partners: Male    Birth control/protection: Post-menopausal    Comment: older than 16, less than 5  Other Topics Concern   Not on file  Social History Narrative   Diet: Healthy      Caffeine: 1 cup of coffee in the morning       Married, if yes what year: Divorced, married in 1975      Do you live in a house, apartment, assisted living, [place], trailer, ect: Apartment, more than 1 stories, 1 person.       Pets: No      Current/Past profession: Masters, Training and development officer       Exercise: Yes, TV class 1 hour M-F         Living Will: Yes   DNR: No   POA/HPOA: Yes       Functional Status:   Do you have difficulty bathing or dressing yourself? Did not answer   Do you have difficulty preparing food or eating? Did not answer   Do you have difficulty managing your medications? Did not answer   Do you have difficulty managing your finances? Did not answer   Do you have difficulty affording your medications? Did not answer   Social Drivers of Health   Financial Resource Strain: Not on file  Food Insecurity: No Food Insecurity (06/17/2019)   Received from [facility], [facility]   Hunger Vital Sign    Worried About Running Out of Food in the Last Year: Never true    Ran Out of Food in the Last Year: Never true  Transportation Needs: Not on file  Physical Activity: Not on file  Stress: No Stress Concern Present (06/17/2019)   Received from [facility], [facility] of Occupational Health - Occupational Stress Questionnaire    Feeling of Stress : Only a little  Social Connections: Not on file     Family History: The patient's family history includes Alzheimer's disease in her mother; Colon cancer (age of onset: 59) in her mother; Osteoporosis in her sister; Parkinsonism in her father; Ulcers in her father. There is no history of Stomach cancer.  ROS:   ROS Please see the history of present illness.     All other systems reviewed and are negative.  EKGs/Labs/Other Studies Reviewed:    The following studies were reviewed today:  EKG Interpretation Date/Time:  Thursday October 07 2023 13:32:02 EST Ventricular Rate:  76 PR Interval:  162 QRS Duration:  90 QT Interval:  396 QTC Calculation: 445 R Axis:   85  Text Interpretation: Normal sinus rhythm Normal ECG When compared with ECG of 28-Jun-2023 06:25, Premature atrial complexes are no longer Present Confirmed by Norman Herrlich ([id number]) on 10/07/2023 1:42:16 PM    Recent Labs: 05/30/2023: Platelets 226 06/28/2023: BUN 17; Creatinine, Ser 0.60; Hemoglobin 11.6;  Potassium 3.8; Sodium 143 09/14/2023: ALT 48  Recent Lipid Panel    Component Value Date/Time   CHOL 173 09/14/2023 0000   CHOL 165 12/10/2021 0958   TRIG 95 09/14/2023 0000   TRIG 103 12/10/2021 0958   HDL 59 09/14/2023 0000   LDLCALC 95 09/14/2023 0000    Physical Exam:    VS:  BP (!) 136/90   Pulse 76   Ht 5\' 6"  (1.676 m)   Wt 179 lb 6.4 oz (81.4 kg)   SpO2 95%   BMI 28.96 kg/m     Wt Readings from Last 3 Encounters:  10/07/23 179 lb 6.4 oz (81.4 kg)  09/20/23 179 lb 12.8 oz (81.6 kg)  08/04/23 180 lb 12.8 oz (82 kg)     GEN:  Well nourished, well developed in no acute distress HEENT: Normal NECK: No JVD; No carotid bruits LYMPHATICS: No lymphadenopathy CARDIAC: RRR, no murmurs, rubs, gallops RESPIRATORY:  Clear to auscultation without rales, wheezing or rhonchi  ABDOMEN: Soft, non-tender, non-distended MUSCULOSKELETAL:  No edema; No deformity  SKIN: Warm and dry NEUROLOGIC:  Alert and oriented x 3 PSYCHIATRIC:  Normal affect     Signed, Norman Herrlich, MD  10/07/2023 2:02 PM    [facility]

## 2023-10-07 ENCOUNTER — Ambulatory Visit: Payer: BLUE CROSS/BLUE SHIELD | Attending: Cardiology

## 2023-10-07 ENCOUNTER — Ambulatory Visit: Payer: BLUE CROSS/BLUE SHIELD | Attending: Cardiology | Admitting: Cardiology

## 2023-10-07 ENCOUNTER — Encounter: Payer: Self-pay | Admitting: Cardiology

## 2023-10-07 VITALS — BP 136/90 | HR 76 | Ht 66.0 in | Wt 179.4 lb

## 2023-10-07 DIAGNOSIS — R002 Palpitations: Secondary | ICD-10-CM | POA: Diagnosis not present

## 2023-10-07 DIAGNOSIS — E785 Hyperlipidemia, unspecified: Secondary | ICD-10-CM

## 2023-10-07 DIAGNOSIS — I10 Essential (primary) hypertension: Secondary | ICD-10-CM

## 2023-10-07 DIAGNOSIS — I739 Peripheral vascular disease, unspecified: Secondary | ICD-10-CM

## 2023-10-07 DIAGNOSIS — M1990 Unspecified osteoarthritis, unspecified site: Secondary | ICD-10-CM

## 2023-10-07 NOTE — Patient Instructions (Addendum)
Medication Instructions:  Your physician recommends that you continue on your current medications as directed. Please refer to the Current Medication list given to you today.  *If you need a refill on your cardiac medications before your next appointment, please call your pharmacy*   Lab Work: None If you have labs (blood work) drawn today and your tests are completely normal, you will receive your results only by: MyChart Message (if you have MyChart) OR A paper copy in the mail If you have any lab test that is abnormal or we need to change your treatment, we will call you to review the results.   Testing/Procedures: A zio monitor was ordered today. It will remain on for 7 days. You will then return monitor and event diary in provided box. It takes 1-2 weeks for report to be downloaded and returned to [place]. We will call you with the results. If monitor falls off or has orange flashing light, please call Zio for further instructions.   Your physician has requested that you have an echocardiogram. Echocardiography is a painless test that uses sound waves to create images of your heart. It provides your doctor with information about the size and shape of your heart and how well your heart's chambers and valves are working. This procedure takes approximately one hour. There are no restrictions for this procedure. Please do NOT wear cologne, perfume, aftershave, or lotions (deodorant is allowed). Please arrive 15 minutes prior to your appointment time.  Please note: We ask at that you not bring children with you during ultrasound (echo/ vascular) testing. Due to room size and safety concerns, children are not allowed in the ultrasound rooms during exams. Our front office staff cannot provide observation of children in our lobby area while testing is being conducted. An adult accompanying a patient to their appointment will only be allowed in the ultrasound room at the discretion of the ultrasound  technician under special circumstances. We apologize for any inconvenience.    Follow-Up: At [facility], you and your health needs are our priority.  As part of our continuing mission to provide you with exceptional heart care, we have created designated Provider Care Teams.  These Care Teams include your primary Cardiologist (physician) and Advanced Practice Providers (APPs -  Physician Assistants and Nurse Practitioners) who all work together to provide you with the care you need, when you need it.  We recommend signing up for the patient portal called "MyChart".  Sign up information is provided on this After Visit Summary.  MyChart is used to connect with patients for Virtual Visits (Telemedicine).  Patients are able to view lab/test results, encounter notes, upcoming appointments, etc.  Non-urgent messages can be sent to your provider as well.   To learn more about what you can do with MyChart, go to [email or web address].    Your next appointment:   4 week(s)  Provider:   Dr. Servando Salina  Other Instructions None     1. Avoid all over-the-counter antihistamines except Claritin/Loratadine and Zyrtec/Cetrizine. 2. Avoid all combination including cold sinus allergies flu decongestant and sleep medications 3. You can use Robitussin DM Mucinex and Mucinex DM for cough. 4. can use Tylenol aspirin ibuprofen and naproxen but no combinations such as sleep or sinus.

## 2023-10-12 DIAGNOSIS — M19072 Primary osteoarthritis, left ankle and foot: Secondary | ICD-10-CM | POA: Diagnosis not present

## 2023-10-12 DIAGNOSIS — M205X1 Other deformities of toe(s) (acquired), right foot: Secondary | ICD-10-CM | POA: Diagnosis not present

## 2023-10-12 DIAGNOSIS — M792 Neuralgia and neuritis, unspecified: Secondary | ICD-10-CM | POA: Diagnosis not present

## 2023-10-12 DIAGNOSIS — L03031 Cellulitis of right toe: Secondary | ICD-10-CM | POA: Diagnosis not present

## 2023-10-12 DIAGNOSIS — M898X7 Other specified disorders of bone, ankle and foot: Secondary | ICD-10-CM | POA: Diagnosis not present

## 2023-10-12 DIAGNOSIS — L600 Ingrowing nail: Secondary | ICD-10-CM | POA: Diagnosis not present

## 2023-10-12 DIAGNOSIS — M19071 Primary osteoarthritis, right ankle and foot: Secondary | ICD-10-CM | POA: Diagnosis not present

## 2023-10-21 DIAGNOSIS — I10 Essential (primary) hypertension: Secondary | ICD-10-CM | POA: Diagnosis not present

## 2023-10-21 DIAGNOSIS — R002 Palpitations: Secondary | ICD-10-CM | POA: Diagnosis not present

## 2023-10-25 ENCOUNTER — Encounter (INDEPENDENT_AMBULATORY_CARE_PROVIDER_SITE_OTHER): Payer: BLUE CROSS/BLUE SHIELD | Admitting: Ophthalmology

## 2023-10-25 DIAGNOSIS — H33103 Unspecified retinoschisis, bilateral: Secondary | ICD-10-CM | POA: Diagnosis not present

## 2023-10-25 DIAGNOSIS — I10 Essential (primary) hypertension: Secondary | ICD-10-CM | POA: Diagnosis not present

## 2023-10-25 DIAGNOSIS — H35033 Hypertensive retinopathy, bilateral: Secondary | ICD-10-CM

## 2023-10-25 DIAGNOSIS — H43813 Vitreous degeneration, bilateral: Secondary | ICD-10-CM | POA: Diagnosis not present

## 2023-10-26 DIAGNOSIS — M898X7 Other specified disorders of bone, ankle and foot: Secondary | ICD-10-CM | POA: Diagnosis not present

## 2023-10-26 DIAGNOSIS — L600 Ingrowing nail: Secondary | ICD-10-CM | POA: Diagnosis not present

## 2023-10-26 DIAGNOSIS — M19072 Primary osteoarthritis, left ankle and foot: Secondary | ICD-10-CM | POA: Diagnosis not present

## 2023-10-26 DIAGNOSIS — M792 Neuralgia and neuritis, unspecified: Secondary | ICD-10-CM | POA: Diagnosis not present

## 2023-10-26 DIAGNOSIS — M19071 Primary osteoarthritis, right ankle and foot: Secondary | ICD-10-CM | POA: Diagnosis not present

## 2023-10-26 DIAGNOSIS — L03032 Cellulitis of left toe: Secondary | ICD-10-CM | POA: Diagnosis not present

## 2023-10-26 DIAGNOSIS — L03031 Cellulitis of right toe: Secondary | ICD-10-CM | POA: Diagnosis not present

## 2023-10-26 DIAGNOSIS — M205X1 Other deformities of toe(s) (acquired), right foot: Secondary | ICD-10-CM | POA: Diagnosis not present

## 2023-10-28 ENCOUNTER — Encounter: Payer: Self-pay | Admitting: Internal Medicine

## 2023-10-28 ENCOUNTER — Encounter: Payer: Self-pay | Admitting: Cardiology

## 2023-10-28 ENCOUNTER — Other Ambulatory Visit: Payer: Self-pay | Admitting: Internal Medicine

## 2023-10-28 ENCOUNTER — Ambulatory Visit (INDEPENDENT_AMBULATORY_CARE_PROVIDER_SITE_OTHER): Payer: Medicare Other

## 2023-10-28 DIAGNOSIS — I739 Peripheral vascular disease, unspecified: Secondary | ICD-10-CM

## 2023-10-28 DIAGNOSIS — E785 Hyperlipidemia, unspecified: Secondary | ICD-10-CM | POA: Diagnosis not present

## 2023-10-28 DIAGNOSIS — R002 Palpitations: Secondary | ICD-10-CM | POA: Diagnosis not present

## 2023-10-28 DIAGNOSIS — I10 Essential (primary) hypertension: Secondary | ICD-10-CM

## 2023-10-28 LAB — ECHOCARDIOGRAM COMPLETE
Area-P 1/2: 4.21 cm2
S' Lateral: 2.77 cm

## 2023-10-28 MED ORDER — NYSTATIN 100000 UNIT/GM EX POWD: 1.0000 | Freq: Three times a day (TID) | CUTANEOUS | 3 refills | Status: AC

## 2023-10-29 ENCOUNTER — Other Ambulatory Visit: Payer: Self-pay

## 2023-10-29 ENCOUNTER — Telehealth: Payer: Self-pay | Admitting: Cardiology

## 2023-10-29 MED ORDER — METOPROLOL SUCCINATE ER 25 MG PO TB24: 12.5000 mg | ORAL_TABLET | Freq: Every day | ORAL | 3 refills | Status: DC

## 2023-11-04 DIAGNOSIS — Z961 Presence of intraocular lens: Secondary | ICD-10-CM | POA: Diagnosis not present

## 2023-11-04 DIAGNOSIS — H26493 Other secondary cataract, bilateral: Secondary | ICD-10-CM | POA: Diagnosis not present

## 2023-11-04 DIAGNOSIS — H33102 Unspecified retinoschisis, left eye: Secondary | ICD-10-CM | POA: Diagnosis not present

## 2023-11-04 DIAGNOSIS — H43392 Other vitreous opacities, left eye: Secondary | ICD-10-CM | POA: Diagnosis not present

## 2023-11-10 DIAGNOSIS — D1801 Hemangioma of skin and subcutaneous tissue: Secondary | ICD-10-CM | POA: Diagnosis not present

## 2023-11-10 DIAGNOSIS — L814 Other melanin hyperpigmentation: Secondary | ICD-10-CM | POA: Diagnosis not present

## 2023-11-10 DIAGNOSIS — L821 Other seborrheic keratosis: Secondary | ICD-10-CM | POA: Diagnosis not present

## 2023-11-11 ENCOUNTER — Encounter: Payer: Self-pay | Admitting: Internal Medicine

## 2023-11-11 ENCOUNTER — Encounter: Payer: Self-pay | Admitting: Cardiology

## 2023-11-11 ENCOUNTER — Ambulatory Visit: Payer: Medicare Other | Attending: Cardiology | Admitting: Cardiology

## 2023-11-11 VITALS — BP 132/76 | HR 76 | Ht 66.0 in | Wt 184.0 lb

## 2023-11-11 DIAGNOSIS — I739 Peripheral vascular disease, unspecified: Secondary | ICD-10-CM

## 2023-11-11 DIAGNOSIS — I10 Essential (primary) hypertension: Secondary | ICD-10-CM | POA: Diagnosis not present

## 2023-11-11 DIAGNOSIS — E559 Vitamin D deficiency, unspecified: Secondary | ICD-10-CM | POA: Diagnosis not present

## 2023-11-11 DIAGNOSIS — E785 Hyperlipidemia, unspecified: Secondary | ICD-10-CM

## 2023-11-11 DIAGNOSIS — E119 Type 2 diabetes mellitus without complications: Secondary | ICD-10-CM

## 2023-11-11 DIAGNOSIS — L600 Ingrowing nail: Secondary | ICD-10-CM | POA: Diagnosis not present

## 2023-11-11 MED ORDER — EZETIMIBE 10 MG PO TABS: 10.0000 mg | ORAL_TABLET | Freq: Every day | ORAL | 3 refills | Status: DC

## 2023-11-11 NOTE — Patient Instructions (Addendum)
 Medication Instructions:  Your physician has recommended you make the following change in your medication:  START: Zetia 10 mg once daily *If you need a refill on your cardiac medications before your next appointment, please call your pharmacy*   Lab Work: TODAY: Vit D In 2 weeks:Lipids  *If you have labs (blood work) drawn today and your tests are completely normal, you will receive your results only by: MyChart Message (if you have MyChart) OR A paper copy in the mail If you have any lab test that is abnormal or we need to change your treatment, we will call you to review the results.   Follow-Up: At [facility], you and your health needs are our priority.  As part of our continuing mission to provide you with exceptional heart care, we have created designated Provider Care Teams.  These Care Teams include your primary Cardiologist (physician) and Advanced Practice Providers (APPs -  Physician Assistants and Nurse Practitioners) who all work together to provide you with the care you need, when you need it.   Your next appointment:   6 month(s)  Provider:   Thomasene Ripple, DO

## 2023-11-12 LAB — VITAMIN D 25 HYDROXY (VIT D DEFICIENCY, FRACTURES): Vit D, 25-Hydroxy: 48.9 ng/mL (ref 30.0–100.0)

## 2023-11-12 NOTE — Progress Notes (Signed)
 Cardiology Office Note:    Date:  11/12/2023   ID:  [name], DOB 06-Aug-1946, MRN [id number]  PCP:  [name], MD  Cardiologist:  Thomasene Ripple, DO  Electrophysiologist:  None   Referring MD: [name], MD   " I am ok"   History of Present Illness:    Victoria Wolfe is a 78 y.o. female with a hx of Hypertension, hyperlipidemia, Diabetes mellitus  and peripheral arterial disease had a stent placement in November due to a blockage in the iliac artery. This blockage was discovered incidentally during an MRI for lower back pain.   The patient reported experiencing pain in her left leg during walking, which has since resolved after the stent placement. She also mentioned having a toenail removed recently, which limited her walking exercise. The patient has been on metformin for diabetes management and has been able to maintain her blood sugar levels around 6.4. She also takes metoprolol, which was prescribed after she wore a heart monitor for two weeks and had an echocardiogram. The patient has a history of smoking but quit almost thirty years ago. She has been experiencing occasional hot flashes and tingling sensations, particularly at night.   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   Arthritis    Per [facility] New Patient Packet    B12 deficiency 03/20/2020   Back pain 09/28/2022   Bilateral malignant neoplasm of breast in female [facility]) 10/18/2018   Breast cancer [facility])    Per [facility] New Patient Packet    Cancer of overlapping sites of left breast (HCC) 10/18/2018   Cataract    Cough 10/04/2023   Diabetes mellitus without complication (HCC)    Diverticulosis 05/10/2019   Elevated LFTs 09/15/2021   Essential hypertension 10/25/2015   Former smoker 05/18/2023   GERD (gastroesophageal reflux disease)    Hepatic steatosis 05/18/2023   High blood pressure    Per [facility] New Patient Packet    High cholesterol    Per [facility] New Patient  Packet    History of colon polyps 05/10/2019   Hyperlipidemia LDL goal <100 10/25/2015   Hyperlipidemia LDL goal <130 10/25/2015   Osteopenia 10/07/2022   Peripheral arterial disease (HCC)    Pneumonia    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   Shingles    Type 2 diabetes mellitus without complication, without long-term current use of insulin (HCC) 09/15/2021   Vitamin D deficiency 09/15/2021    Past Surgical History:  Procedure Laterality Date   ABDOMINAL AORTOGRAM W/LOWER EXTREMITY N/A 06/28/2023   Procedure: ABDOMINAL AORTOGRAM W/LOWER EXTREMITY;  Surgeon: Maeola Harman, MD;  Location: [facility] INVASIVE CV LAB;  Service: Cardiovascular;  Laterality: N/A;   CATARACT EXTRACTION  2018   Per [facility] New Patient Packet, Dr.Todd Casper Harrison    CESAREAN SECTION     x2   COLONOSCOPY  2018   Per [facility] new patient packet, Dr.Locker   ILIAC ARTERY STENT N/A    2   MASTECTOMY Bilateral 02/02/2019   Per [facility] New Patient Packet, DrMichele Blackwood and Dr.Colon    MENISCECTOMY Right 2009   MENISCUS REPAIR  2004   Per [facility]  New Patient Packet, Dr. Karolee Stamps   TOENAIL TRIMMING Left    ingrown in big toe   TONSILLECTOMY  1951   Per [facility] New Patient Packet    Current Medications: Current Meds  Medication Sig   amLODipine (NORVASC) 5 MG tablet Take 1  tablet (5 mg total) by mouth daily.   aspirin EC (ASPIRIN ADULT LOW DOSE) 81 MG tablet Take 81 mg by mouth once.   BIOTIN 5000 PO Take 10,000 capsules by mouth daily.   calcium carbonate (OSCAL) 1500 (600 Ca) MG TABS tablet Take 2 tablets (3,000 mg total) by mouth daily with breakfast.   candesartan-hydrochlorothiazide (ATACAND HCT) 32-12.5 MG tablet TAKE 1 TABLET BY MOUTH EVERY DAY   cholecalciferol (VITAMIN D3) 25 MCG (1000 UNIT) tablet Take 1,000 Units by mouth daily.   clopidogrel (PLAVIX) 75 MG tablet Take 1 tablet (75 mg total) by mouth daily.   Cyanocobalamin (VITAMIN B-12) 1000  MCG SUBL Take 1,000 mcg by mouth daily.   esomeprazole (NEXIUM) 20 MG capsule Take 20 mg by mouth daily at 12 noon.   ezetimibe (ZETIA) 10 MG tablet Take 1 tablet (10 mg total) by mouth daily.   metFORMIN (GLUCOPHAGE) 500 MG tablet Take 1 tablet (500 mg total) by mouth 2 (two) times daily with a meal.   metoprolol succinate (TOPROL XL) 25 MG 24 hr tablet Take 0.5 tablets (12.5 mg total) by mouth daily.   nystatin (MYCOSTATIN/NYSTOP) powder Apply 1 Application topically 3 (three) times daily.   Polyethyl Glycol-Propyl Glycol (SYSTANE) 0.4-0.3 % SOLN Place 1 drop into both eyes every morning.   psyllium (METAMUCIL) 58.6 % packet Take 1 packet by mouth daily.   rosuvastatin (CRESTOR) 20 MG tablet TAKE 1 TABLET BY MOUTH EVERY DAY   Saline 0.9 % AERS Place 1 spray into the nose daily at 2 am. Simply Saline   TURMERIC PO Take 538 mg by mouth daily.   vitamin C (ASCORBIC ACID) 500 MG tablet Take 500 mg by mouth daily.     Allergies:   Amoxicillin and Zithromax [azithromycin]   Social History   Socioeconomic History   Marital status: Divorced    Spouse name: Not on file   Number of children: 2   Years of education: Not on file   Highest education level: Not on file  Occupational History   Not on file  Tobacco Use   Smoking status: Former    Current packs/day: 0.00    Types: Cigarettes    Start date: 70    Quit date: 1995    Years since quitting: 30.2   Smokeless tobacco: Never  Vaping Use   Vaping status: Never Used  Substance and Sexual Activity   Alcohol use: Not Currently   Drug use: Not Currently   Sexual activity: Not Currently    Partners: Male    Birth control/protection: Post-menopausal    Comment: older than 16, less than 5  Other Topics Concern   Not on file  Social History Narrative   Diet: Healthy      Caffeine: 1 cup of coffee in the morning       Married, if yes what year: Divorced, married in 1975      Do you live in a house, apartment, assisted living,  [place], trailer, ect: Apartment, more than 1 stories, 1 person.       Pets: No      Current/Past profession: Masters, Training and development officer       Exercise: Yes, TV class 1 hour M-F         Living Will: Yes   DNR: No   POA/HPOA: Yes      Functional Status:   Do you have difficulty bathing or dressing yourself? Did not answer   Do you have difficulty preparing  food or eating? Did not answer   Do you have difficulty managing your medications? Did not answer   Do you have difficulty managing your finances? Did not answer   Do you have difficulty affording your medications? Did not answer   Social Drivers of Health   Financial Resource Strain: Not on file  Food Insecurity: No Food Insecurity (06/17/2019)   Received from [facility], [facility]   Hunger Vital Sign    Worried About Running Out of Food in the Last Year: Never true    Ran Out of Food in the Last Year: Never true  Transportation Needs: Not on file  Physical Activity: Not on file  Stress: No Stress Concern Present (06/17/2019)   Received from [facility], [facility] of Occupational Health - Occupational Stress Questionnaire    Feeling of Stress : Only a little  Social Connections: Not on file     Family History: The patient's family history includes Alzheimer's disease in her mother; Colon cancer (age of onset: 65) in her mother; Osteoporosis in her sister; Parkinsonism in her father; Ulcers in her father. There is no history of Stomach cancer.  ROS:   Review of Systems  Constitution: Negative for decreased appetite, fever and weight gain.  HENT: Negative for congestion, ear discharge, hoarse voice and sore throat.   Eyes: Negative for discharge, redness, vision loss in right eye and visual halos.  Cardiovascular: Negative for chest pain, dyspnea on exertion, leg swelling, orthopnea and palpitations.  Respiratory: Negative for cough, hemoptysis, shortness of breath  and snoring.   Endocrine: Negative for heat intolerance and polyphagia.  Hematologic/Lymphatic: Negative for bleeding problem. Does not bruise/bleed easily.  Skin: Negative for flushing, nail changes, rash and suspicious lesions.  Musculoskeletal: Negative for arthritis, joint pain, muscle cramps, myalgias, neck pain and stiffness.  Gastrointestinal: Negative for abdominal pain, bowel incontinence, diarrhea and excessive appetite.  Genitourinary: Negative for decreased libido, genital sores and incomplete emptying.  Neurological: Negative for brief paralysis, focal weakness, headaches and loss of balance.  Psychiatric/Behavioral: Negative for altered mental status, depression and suicidal ideas.  Allergic/Immunologic: Negative for HIV exposure and persistent infections.    EKGs/Labs/Other Studies Reviewed:    The following studies were reviewed today:   EKG:  None today    Recent Labs: 05/30/2023: Platelets 226 06/28/2023: BUN 17; Creatinine, Ser 0.60; Hemoglobin 11.6; Potassium 3.8; Sodium 143 09/14/2023: ALT 48  Recent Lipid Panel    Component Value Date/Time   CHOL 173 09/14/2023 0000   CHOL 165 12/10/2021 0958   TRIG 95 09/14/2023 0000   TRIG 103 12/10/2021 0958   HDL 59 09/14/2023 0000   LDLCALC 95 09/14/2023 0000    Physical Exam:    VS:  BP 132/76 (BP Location: Right Arm, Patient Position: Sitting, Cuff Size: Normal)   Pulse 76   Ht 5\' 6"  (1.676 m)   Wt 184 lb (83.5 kg)   SpO2 97%   BMI 29.70 kg/m     Wt Readings from Last 3 Encounters:  11/11/23 184 lb (83.5 kg)  10/07/23 179 lb 6.4 oz (81.4 kg)  09/20/23 179 lb 12.8 oz (81.6 kg)     GEN: Well nourished, well developed in no acute distress HEENT: Normal NECK: No JVD; No carotid bruits LYMPHATICS: No lymphadenopathy CARDIAC: S1S2 noted,RRR, no murmurs, rubs, gallops RESPIRATORY:  Clear to auscultation without rales, wheezing or rhonchi  ABDOMEN: Soft, non-tender, non-distended, +bowel sounds, no  guarding. EXTREMITIES: No edema, No cyanosis, no  clubbing MUSCULOSKELETAL:  No deformity  SKIN: Warm and dry NEUROLOGIC:  Alert and oriented x 3, non-focal PSYCHIATRIC:  Normal affect, good insight  ASSESSMENT:    1. Vitamin D deficiency   2. Hyperlipidemia LDL goal <100   3. PAD (peripheral artery disease) (HCC)   4. Essential hypertension   5. Type 2 diabetes mellitus without complication, without long-term current use of insulin (HCC)    PLAN:    Peripheral Artery Disease (PAD) Stent placed in iliac artery. No leg pain post-surgery. Back pain likely due to arthritis and bulging disc. Emphasized aggressive primary prevention due to PAD and diabetes. - Monitor for coronary artery disease symptoms: chest discomfort, fatigue, shortness of breath. - Start Zetia 10 mg daily to enhance rosuvastatin effect. - Target LDL <55 mg/dL.  Diabetes Mellitus HbA1c at 6.4%. On metformin. Diabetes increases coronary artery disease risk, necessitating aggressive LDL management. - Continue metformin therapy. - Monitor blood glucose levels regularly.  Hyperlipidemia LDL at 95 mg/dL, above target for diabetes and PAD. Plan to optimize medication regimen. - Continue rosuvastatin 20 mg daily. - Add Zetia 10 mg daily. - Monitor lipid panel in 12 weeks.  Hypertension On metoprolol for tachycardia. Reports reduced gastrointestinal upset when taken with food. - Continue metoprolol therapy. - Monitor blood pressure regularly.  Vitamin D Deficiency Taking supplements. Current levels need checking. - Check vitamin D levels today.  The patient is in agreement with the above plan. The patient left the office in stable condition.  The patient will follow up in 6 months.   Medication Adjustments/Labs and Tests Ordered: Current medicines are reviewed at length with the patient today.  Concerns regarding medicines are outlined above.  Orders Placed This Encounter  Procedures   VITAMIN D 25 Hydroxy  (Vit-D Deficiency, Fractures)   Lipid panel   Meds ordered this encounter  Medications   ezetimibe (ZETIA) 10 MG tablet    Sig: Take 1 tablet (10 mg total) by mouth daily.    Dispense:  90 tablet    Refill:  3    Patient Instructions  Medication Instructions:  Your physician has recommended you make the following change in your medication:  START: Zetia 10 mg once daily *If you need a refill on your cardiac medications before your next appointment, please call your pharmacy*   Lab Work: TODAY: Vit D In 2 weeks:Lipids  *If you have labs (blood work) drawn today and your tests are completely normal, you will receive your results only by: MyChart Message (if you have MyChart) OR A paper copy in the mail If you have any lab test that is abnormal or we need to change your treatment, we will call you to review the results.   Follow-Up: At [facility], you and your health needs are our priority.  As part of our continuing mission to provide you with exceptional heart care, we have created designated Provider Care Teams.  These Care Teams include your primary Cardiologist (physician) and Advanced Practice Providers (APPs -  Physician Assistants and Nurse Practitioners) who all work together to provide you with the care you need, when you need it.   Your next appointment:   6 month(s)  Provider:   Thomasene Ripple, DO       Adopting a Healthy Lifestyle.  Know what a healthy weight is for you (roughly BMI <25) and aim to maintain this   Aim for 7+ servings of fruits and vegetables daily   65-80+ fluid ounces of water or  unsweet tea for healthy kidneys   Limit to max 1 drink of alcohol per day; avoid smoking/tobacco   Limit animal fats in diet for cholesterol and heart health - choose grass fed whenever available   Avoid highly processed foods, and foods high in saturated/trans fats   Aim for low stress - take time to unwind and care for your mental health   Aim for  150 min of moderate intensity exercise weekly for heart health, and weights twice weekly for bone health   Aim for 7-9 hours of sleep daily   When it comes to diets, agreement about the perfect plan isnt easy to find, even among the experts. Experts at the [facility] of [employer] developed an idea known as the Healthy Eating Plate. Just imagine a plate divided into logical, healthy portions.   The emphasis is on diet quality:   Load up on vegetables and fruits - one-half of your plate: Aim for color and variety, and remember that potatoes dont count.   Go for whole grains - one-quarter of your plate: Whole wheat, barley, wheat berries, quinoa, oats, brown rice, and foods made with them. If you want pasta, go with whole wheat pasta.   Protein power - one-quarter of your plate: Fish, chicken, beans, and nuts are all healthy, versatile protein sources. Limit red meat.   The diet, however, does go beyond the plate, offering a few other suggestions.   Use healthy plant oils, such as olive, canola, soy, corn, sunflower and peanut. Check the labels, and avoid partially hydrogenated oil, which have unhealthy trans fats.   If youre thirsty, drink water. Coffee and tea are good in moderation, but skip sugary drinks and limit milk and dairy products to one or two daily servings.   The type of carbohydrate in the diet is more important than the amount. Some sources of carbohydrates, such as vegetables, fruits, whole grains, and beans-are healthier than others.   Finally, stay active  Signed, Thomasene Ripple, DO  11/12/2023 8:32 PM    [facility]

## 2023-11-14 ENCOUNTER — Other Ambulatory Visit: Payer: Self-pay | Admitting: Internal Medicine

## 2023-11-14 DIAGNOSIS — I10 Essential (primary) hypertension: Secondary | ICD-10-CM

## 2023-11-15 ENCOUNTER — Ambulatory Visit: Payer: BLUE CROSS/BLUE SHIELD | Admitting: Obstetrics and Gynecology

## 2023-11-15 ENCOUNTER — Encounter: Payer: Self-pay | Admitting: Cardiology

## 2023-11-15 NOTE — Telephone Encounter (Signed)
Message routed to PCP Gupta, Anjali L, MD  

## 2023-11-19 ENCOUNTER — Encounter: Payer: Self-pay | Admitting: Obstetrics and Gynecology

## 2023-11-19 ENCOUNTER — Ambulatory Visit (INDEPENDENT_AMBULATORY_CARE_PROVIDER_SITE_OTHER): Payer: BLUE CROSS/BLUE SHIELD | Admitting: Obstetrics and Gynecology

## 2023-11-19 VITALS — BP 156/80 | HR 80 | Temp 98.1°F | Ht 67.75 in | Wt 183.0 lb

## 2023-11-19 DIAGNOSIS — Z01419 Encounter for gynecological examination (general) (routine) without abnormal findings: Secondary | ICD-10-CM

## 2023-11-19 DIAGNOSIS — N958 Other specified menopausal and perimenopausal disorders: Secondary | ICD-10-CM | POA: Diagnosis not present

## 2023-11-19 DIAGNOSIS — Z9189 Other specified personal risk factors, not elsewhere classified: Secondary | ICD-10-CM | POA: Diagnosis not present

## 2023-11-19 DIAGNOSIS — N951 Menopausal and female climacteric states: Secondary | ICD-10-CM

## 2023-11-19 DIAGNOSIS — Z1331 Encounter for screening for depression: Secondary | ICD-10-CM | POA: Diagnosis not present

## 2023-11-19 DIAGNOSIS — Z9289 Personal history of other medical treatment: Secondary | ICD-10-CM

## 2023-11-19 DIAGNOSIS — C50812 Malignant neoplasm of overlapping sites of left female breast: Secondary | ICD-10-CM | POA: Diagnosis not present

## 2023-11-19 MED ORDER — GABAPENTIN 100 MG PO CAPS: ORAL_CAPSULE | ORAL | 0 refills | Status: DC

## 2023-11-19 NOTE — Assessment & Plan Note (Addendum)
 Discussed nonhormonal options for VMS, including SSRIs, veozah, gabapentin. Recommend starting gabapentin, will initiate taper Discussed side effects including drowsiness, dizziness, respiratory depression, accidental injury, irritability, agitation, suicidal ideation. F/u PRN

## 2023-11-19 NOTE — Assessment & Plan Note (Signed)
 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

## 2023-11-19 NOTE — Patient Instructions (Signed)
 Considering applying aquaphor or coconut oil to the vulva after showers. You could also using daily vaginal moisturizers by brands like Good Clean Love and Ah! Yes.  For patients under 50-78yo, I recommend 1200mg  calcium daily and 600IU of vitamin D daily. For patients over 78yo, I recommend 1200mg  calcium daily and 800IU of vitamin D daily.  Health Maintenance, Female Adopting a healthy lifestyle and getting preventive care are important in promoting health and wellness. Ask your health care provider about: The right schedule for you to have regular tests and exams. Things you can do on your own to prevent diseases and keep yourself healthy. What should I know about diet, weight, and exercise? Eat a healthy diet  Eat a diet that includes plenty of vegetables, fruits, low-fat dairy products, and lean protein. Do not eat a lot of foods that are high in solid fats, added sugars, or sodium. Maintain a healthy weight Body mass index (BMI) is used to identify weight problems. It estimates body fat based on height and weight. Your health care provider can help determine your BMI and help you achieve or maintain a healthy weight. Get regular exercise Get regular exercise. This is one of the most important things you can do for your health. Most adults should: Exercise for at least 150 minutes each week. The exercise should increase your heart rate and make you sweat (moderate-intensity exercise). Do strengthening exercises at least twice a week. This is in addition to the moderate-intensity exercise. Spend less time sitting. Even light physical activity can be beneficial. Watch cholesterol and blood lipids Have your blood tested for lipids and cholesterol at 77 years of age, then have this test every 5 years. Have your cholesterol levels checked more often if: Your lipid or cholesterol levels are high. You are older than 78 years of age. You are at high risk for heart disease. What should I know  about cancer screening? Depending on your health history and family history, you may need to have cancer screening at various ages. This may include screening for: Breast cancer. Cervical cancer. Colorectal cancer. Skin cancer. Lung cancer. What should I know about heart disease, diabetes, and high blood pressure? Blood pressure and heart disease High blood pressure causes heart disease and increases the risk of stroke. This is more likely to develop in people who have high blood pressure readings or are overweight. Have your blood pressure checked: Every 3-5 years if you are 29-15 years of age. Every year if you are 55 years old or older. Diabetes Have regular diabetes screenings. This checks your fasting blood sugar level. Have the screening done: Once every three years after age 39 if you are at a normal weight and have a low risk for diabetes. More often and at a younger age if you are overweight or have a high risk for diabetes. What should I know about preventing infection? Hepatitis B If you have a higher risk for hepatitis B, you should be screened for this virus. Talk with your health care provider to find out if you are at risk for hepatitis B infection. Hepatitis C Testing is recommended for: Everyone born from 1 through 1965. Anyone with known risk factors for hepatitis C. Sexually transmitted infections (STIs) Get screened for STIs, including gonorrhea and chlamydia, if: You are sexually active and are younger than 78 years of age. You are older than 78 years of age and your health care provider tells you that you are at risk for this type  of infection. Your sexual activity has changed since you were last screened, and you are at increased risk for chlamydia or gonorrhea. Ask your health care provider if you are at risk. Ask your health care provider about whether you are at high risk for HIV. Your health care provider may recommend a prescription medicine to help prevent  HIV infection. If you choose to take medicine to prevent HIV, you should first get tested for HIV. You should then be tested every 3 months for as long as you are taking the medicine. Osteoporosis and menopause Osteoporosis is a disease in which the bones lose minerals and strength with aging. This can result in bone fractures. If you are 13 years old or older, or if you are at risk for osteoporosis and fractures, ask your health care provider if you should: Be screened for bone loss. Take a calcium or vitamin D supplement to lower your risk of fractures. Be given hormone replacement therapy (HRT) to treat symptoms of menopause. Follow these instructions at home: Alcohol use Do not drink alcohol if: Your health care provider tells you not to drink. You are pregnant, may be pregnant, or are planning to become pregnant. If you drink alcohol: Limit how much you have to: 0-1 drink a day. Know how much alcohol is in your drink. In the [place] one drink equals one 12 oz bottle of beer (355 mL), one 5 oz glass of wine (148 mL), or one 1 oz glass of hard liquor (44 mL). Lifestyle Do not use any products that contain nicotine or tobacco. These products include cigarettes, chewing tobacco, and vaping devices, such as e-cigarettes. If you need help quitting, ask your health care provider. Do not use street drugs. Do not share needles. Ask your health care provider for help if you need support or information about quitting drugs. General instructions Schedule regular health, dental, and eye exams. Stay current with your vaccines. Tell your health care provider if: You often feel depressed. You have ever been abused or do not feel safe at home. Summary Adopting a healthy lifestyle and getting preventive care are important in promoting health and wellness. Follow your health care provider's instructions about healthy diet, exercising, and getting tested or screened for diseases. Follow your health care  provider's instructions on monitoring your cholesterol and blood pressure. This information is not intended to replace advice given to you by your health care provider. Make sure you discuss any questions you have with your health care provider. Document Revised: 12/30/2020 Document Reviewed: 12/30/2020 Elsevier Patient Education  2024 [employer].

## 2023-11-19 NOTE — Progress Notes (Signed)
 78 y.o. 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 Divorced. [employer].   She reports Vaginal dryness and vaginal odor at times.  Notes recent sever VMS betweem [id number].  She awakes in a drenching sweat with heart racing and ear ringing.  She has been evaluated by cardiology for this and was recently started on additional blood pressure medications.  Postmenopausal bleeding: No Pelvic discharge or pain: No Breast mass, nipple discharge or skin changes : No Last PAP: No results found for: "DIAGPAP", "HPVHIGH", "ADEQPAP" Last mammogram: 2020, bilateral mastectomy Last colonoscopy: 12/21/2022, positive polyps Last DXA: 2021 normal Sexually active: No Exercising: Yes, works out at home Smoker: No  GYN HISTORY: Breast cancer, 2020   OB History  Gravida Para Term Preterm AB Living  2 2 2   0 2  SAB IAB Ectopic Multiple Live Births  0  0      # Outcome Date GA Lbr Len/2nd Weight Sex Type Anes PTL Lv  2 Term           1 Term             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   Arthritis    Per [facility] New Patient Packet    B12 deficiency 03/20/2020   Back pain 09/28/2022   Bilateral malignant neoplasm of breast in female [facility]) 10/18/2018   Breast cancer (HCC)    Per [facility] New Patient Packet    Cancer of overlapping sites of left breast (HCC) 10/18/2018   Cataract    Cough 10/04/2023   Diabetes mellitus without complication (HCC)    Diverticulosis 05/10/2019   Elevated LFTs 09/15/2021   Essential hypertension 10/25/2015   Former smoker 05/18/2023   GERD (gastroesophageal reflux disease)    Hepatic steatosis 05/18/2023   High blood pressure    Per [facility] New Patient Packet    High cholesterol    Per [facility] New Patient Packet     History of colon polyps 05/10/2019   Hyperlipidemia LDL goal <100 10/25/2015   Hyperlipidemia LDL goal <130 10/25/2015   Osteopenia 10/07/2022   Peripheral arterial disease (HCC)    Pneumonia    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   Shingles    Type 2 diabetes mellitus without complication, without long-term current use of insulin (HCC) 09/15/2021   Vitamin D deficiency 09/15/2021    Past Surgical History:  Procedure Laterality Date   ABDOMINAL AORTOGRAM W/LOWER EXTREMITY N/A 06/28/2023   Procedure: ABDOMINAL AORTOGRAM W/LOWER EXTREMITY;  Surgeon: Maeola Harman, MD;  Location[facility] INVASIVE CV LAB;  Service: Cardiovascular;  Laterality: N/A;   CATARACT EXTRACTION  2018   Per [facility] New Patient Packet, Dr.Todd Casper Harrison    CESAREAN SECTION     x2   COLONOSCOPY  2018   Per [facility] new patient packet, Dr.Locker   ILIAC ARTERY STENT N/A    2   MASTECTOMY Bilateral 02/02/2019   Per [facility] New Patient Packet, DrMichele Blackwood and Dr.Colon    MENISCECTOMY Right 2009   MENISCUS REPAIR  2004   Per [facility] New Patient Packet, Dr. Karolee Stamps   TOENAIL TRIMMING Left    ingrown in big toe   TONSILLECTOMY  1951   Per [facility] New Patient Packet    Current  Outpatient Medications on File Prior to Visit  Medication Sig Dispense Refill   amLODipine (NORVASC) 5 MG tablet Take 1 tablet (5 mg total) by mouth daily. 90 tablet 3   aspirin EC (ASPIRIN ADULT LOW DOSE) 81 MG tablet Take 81 mg by mouth once.     BIOTIN 5000 PO Take 10,000 capsules by mouth daily.     calcium carbonate (OSCAL) 1500 (600 Ca) MG TABS tablet Take 2 tablets (3,000 mg total) by mouth daily with breakfast.     candesartan-hydrochlorothiazide (ATACAND HCT) 32-12.5 MG tablet TAKE 1 TABLET BY MOUTH EVERY DAY 90 tablet 1   cholecalciferol (VITAMIN D3) 25 MCG (1000 UNIT) tablet Take 1,000 Units by mouth daily.     clopidogrel (PLAVIX) 75 MG tablet Take 1 tablet (75 mg  total) by mouth daily. 30 tablet 11   Cyanocobalamin (VITAMIN B-12) 1000 MCG SUBL Take 1,000 mcg by mouth daily.     esomeprazole (NEXIUM) 20 MG capsule Take 20 mg by mouth daily at 12 noon.     ezetimibe (ZETIA) 10 MG tablet Take 1 tablet (10 mg total) by mouth daily. 90 tablet 3   metFORMIN (GLUCOPHAGE) 500 MG tablet Take 1 tablet (500 mg total) by mouth 2 (two) times daily with a meal. 180 tablet 3   metoprolol succinate (TOPROL XL) 25 MG 24 hr tablet Take 0.5 tablets (12.5 mg total) by mouth daily. 45 tablet 3   nystatin (MYCOSTATIN/NYSTOP) powder Apply 1 Application topically 3 (three) times daily. 15 g 3   Polyethyl Glycol-Propyl Glycol (SYSTANE) 0.4-0.3 % SOLN Place 1 drop into both eyes every morning.     psyllium (METAMUCIL) 58.6 % packet Take 1 packet by mouth daily.     rosuvastatin (CRESTOR) 20 MG tablet TAKE 1 TABLET BY MOUTH EVERY DAY 90 tablet 3   Saline 0.9 % AERS Place 1 spray into the nose daily at 2 am. Simply Saline     TURMERIC PO Take 538 mg by mouth daily.     vitamin C (ASCORBIC ACID) 500 MG tablet Take 500 mg by mouth daily.     No current facility-administered medications on file prior to visit.    Social History   Socioeconomic History   Marital status: Divorced    Spouse name: Not on file   Number of children: 2   Years of education: Not on file   Highest education level: Not on file  Occupational History   Not on file  Tobacco Use   Smoking status: Former    Current packs/day: 0.00    Types: Cigarettes    Start date: 56    Quit date: 1995    Years since quitting: 30.2   Smokeless tobacco: Never  Vaping Use   Vaping status: Never Used  Substance and Sexual Activity   Alcohol use: Yes    Alcohol/week: 1.0 standard drink of alcohol    Types: 1 Glasses of wine per week   Drug use: Not Currently   Sexual activity: Not Currently    Partners: Male    Birth control/protection: Post-menopausal    Comment: older than 16, less than 5  Other Topics  Concern   Not on file  Social History Narrative   Diet: Healthy      Caffeine: 1 cup of coffee in the morning       Married, if yes what year: Divorced, married in 1975      Do you live in a house, apartment, assisted living, [place], trailer, ect:  Apartment, more than 1 stories, 1 person.       Pets: No      Current/Past profession: Masters, Training and development officer       Exercise: Yes, TV class 1 hour M-F         Living Will: Yes   DNR: No   POA/HPOA: Yes      Functional Status:   Do you have difficulty bathing or dressing yourself? Did not answer   Do you have difficulty preparing food or eating? Did not answer   Do you have difficulty managing your medications? Did not answer   Do you have difficulty managing your finances? Did not answer   Do you have difficulty affording your medications? Did not answer   Social Drivers of Health   Financial Resource Strain: Not on file  Food Insecurity: No Food Insecurity (06/17/2019)   Received from [facility], [facility]   Hunger Vital Sign    Worried About Running Out of Food in the Last Year: Never true    Ran Out of Food in the Last Year: Never true  Transportation Needs: Not on file  Physical Activity: Not on file  Stress: No Stress Concern Present (06/17/2019)   Received from [facility], [facility] - Occupational Stress Questionnaire    Feeling of Stress : Only a little  Social Connections: Not on file  Intimate Partner Violence: Not on file    Family History  Problem Relation Age of Onset   Alzheimer's disease Mother    Colon cancer Mother 11   Ulcers Father    Parkinsonism Father    Osteoporosis Sister    Stomach cancer Neg Hx     Allergies  Allergen Reactions   Amoxicillin Rash    Had rash after taking amoxil and zithromax, not sure which one caused the rash   Zithromax [Azithromycin] Rash    Had rash after taking zithromax and amoxil at  the same time, not sure which antibiotic caused the rash      PE Today's Vitals   11/19/23 0819  BP: (!) 156/80  Pulse: 80  Temp: 98.1 F (36.7 C)  TempSrc: Oral  SpO2: 97%  Weight: 183 lb (83 kg)  Height: 5' 7.75" (1.721 m)   Body mass index is 28.03 kg/m.  Physical Exam Vitals reviewed. Exam conducted with a chaperone present.  Constitutional:      General: She is not in acute distress.    Appearance: Normal appearance.  HENT:     Head: Normocephalic and atraumatic.     Nose: Nose normal.  Eyes:     Extraocular Movements: Extraocular movements intact.     Conjunctiva/sclera: Conjunctivae normal.  Neck:     Thyroid: No thyroid mass, thyromegaly or thyroid tenderness.  Pulmonary:     Effort: Pulmonary effort is normal.  Chest:     Chest wall: No mass or tenderness.  Breasts:    Right: Normal. No swelling, mass, nipple discharge, skin change or tenderness.     Left: Normal. No swelling, mass, nipple discharge, skin change or tenderness.     Comments: bilateral breast implants Abdominal:     General: There is no distension.     Palpations: Abdomen is soft.     Tenderness: There is no abdominal tenderness.  Genitourinary:    General: Normal vulva.     Exam position: Lithotomy position.     Urethra: No prolapse.     Vagina:  Normal. No vaginal discharge or bleeding.     Cervix: Normal. No lesion.     Uterus: Normal. Not enlarged and not tender.      Adnexa: Right adnexa normal and left adnexa normal.  Musculoskeletal:        General: Normal range of motion.     Cervical back: Normal range of motion.  Lymphadenopathy:     Upper Body:     Right upper body: No axillary adenopathy.     Left upper body: No axillary adenopathy.     Lower Body: No right inguinal adenopathy. No left inguinal adenopathy.  Skin:    General: Skin is warm and dry.  Neurological:     General: No focal deficit present.     Mental Status: She is alert.  Psychiatric:        Mood and  Affect: Mood normal.        Behavior: Behavior normal.       Assessment and Plan:        Well woman exam with routine gynecological exam Assessment & Plan: Cervical cancer screening performed according to ASCCP guidelines. Status post bilateral mastectomy 2020, mammogram not indicated at this time Colonoscopy UTD DXA UTD Labs and immunizations with her primary Encouraged safe sexual practices as indicated Encouraged healthy lifestyle practices with diet and exercise For patients over 70yo, I recommend 1200mg  calcium daily and 800IU of vitamin D daily.    Cancer of overlapping sites of left breast [facility]) Assessment & Plan: History of left breast cancer, dx Feb 2020 Stage 1, ER pos, BRCA1-2 Negative S/p BL mastectomy June 2020, reconstruction Nov 2020 Stopped tamoxifene 2023 due to side effects Continues to follow with oncology  No further recommendation for mammogram.   Personal history of other medical treatment  Vasomotor symptoms due to menopause Assessment & Plan: Discussed nonhormonal options for VMS, including SSRIs, veozah, gabapentin. Recommend starting gabapentin, will initiate taper Discussed side effects including drowsiness, dizziness, respiratory depression, accidental injury, irritability, agitation, suicidal ideation. F/u PRN  Orders: -     Gabapentin; Take 1 capsule (100 mg total) by mouth at bedtime for 7 days, THEN 2 capsules (200 mg total) at bedtime for 7 days, THEN 3 capsules (300 mg total) at bedtime.  Dispense: 249 capsule; Refill: 0  Genitourinary syndrome of menopause  Considering applying aquaphor or coconut oil to the vulva after showers. You could also using daily vaginal moisturizers by brands like Good Clean Love and Ah! Yes.   Rosalyn Gess, MD

## 2023-11-19 NOTE — Assessment & Plan Note (Addendum)
 Cervical cancer screening performed according to ASCCP guidelines. Status post bilateral mastectomy 2020, mammogram not indicated at this time Colonoscopy UTD DXA UTD Labs and immunizations with her primary Encouraged safe sexual practices as indicated Encouraged healthy lifestyle practices with diet and exercise For patients over 78yo, I recommend 1200mg  calcium daily and 800IU of vitamin D daily.

## 2023-11-30 ENCOUNTER — Ambulatory Visit (HOSPITAL_BASED_OUTPATIENT_CLINIC_OR_DEPARTMENT_OTHER): Payer: BLUE CROSS/BLUE SHIELD | Admitting: Cardiology

## 2023-11-30 IMAGING — CR DG CHEST 2V
2 series · 2 of 2 positions shown · non-contrast
Comparison: None.

CLINICAL DATA: Cough.

EXAM:
CHEST - 2 VIEW

[w chest pa]
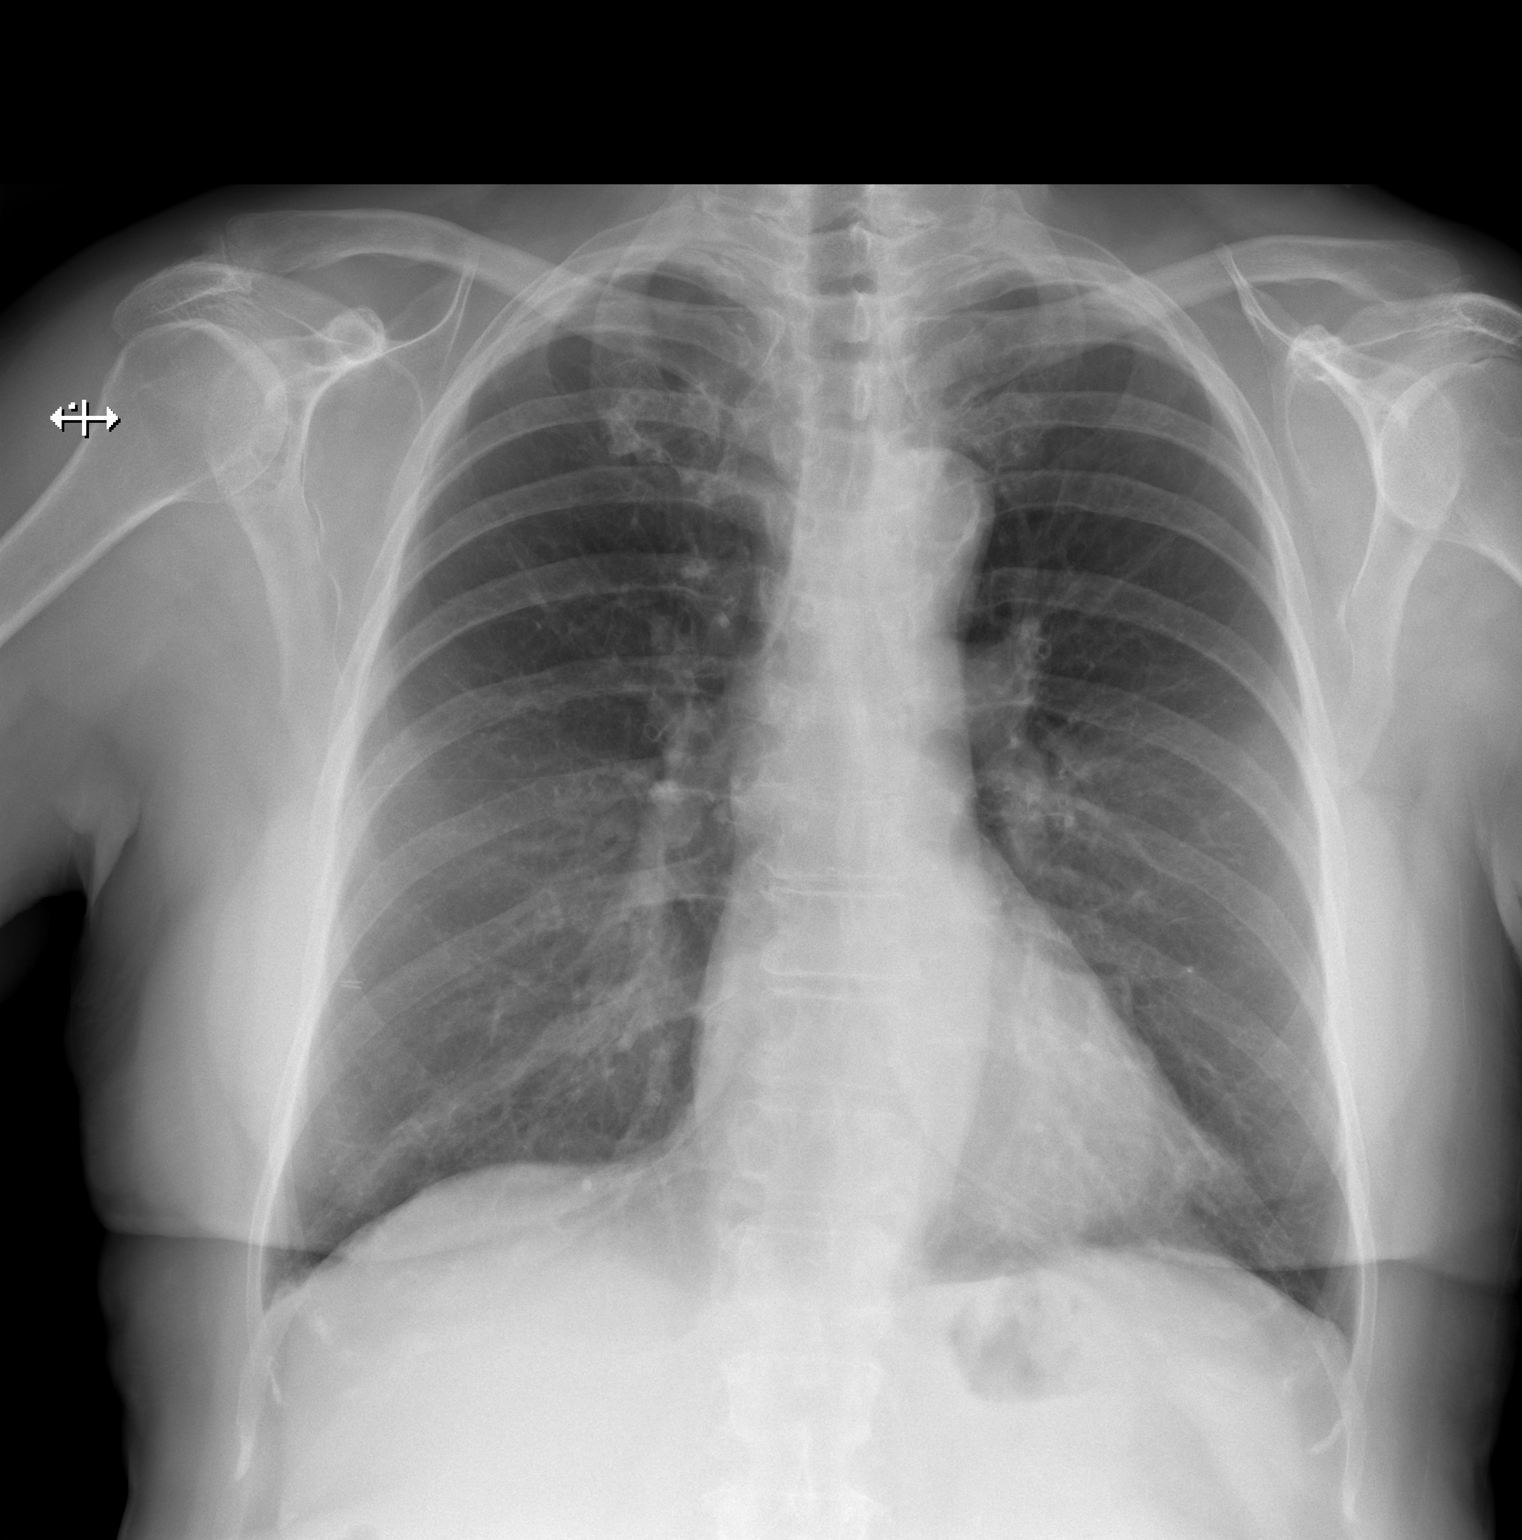

[w chest lat]
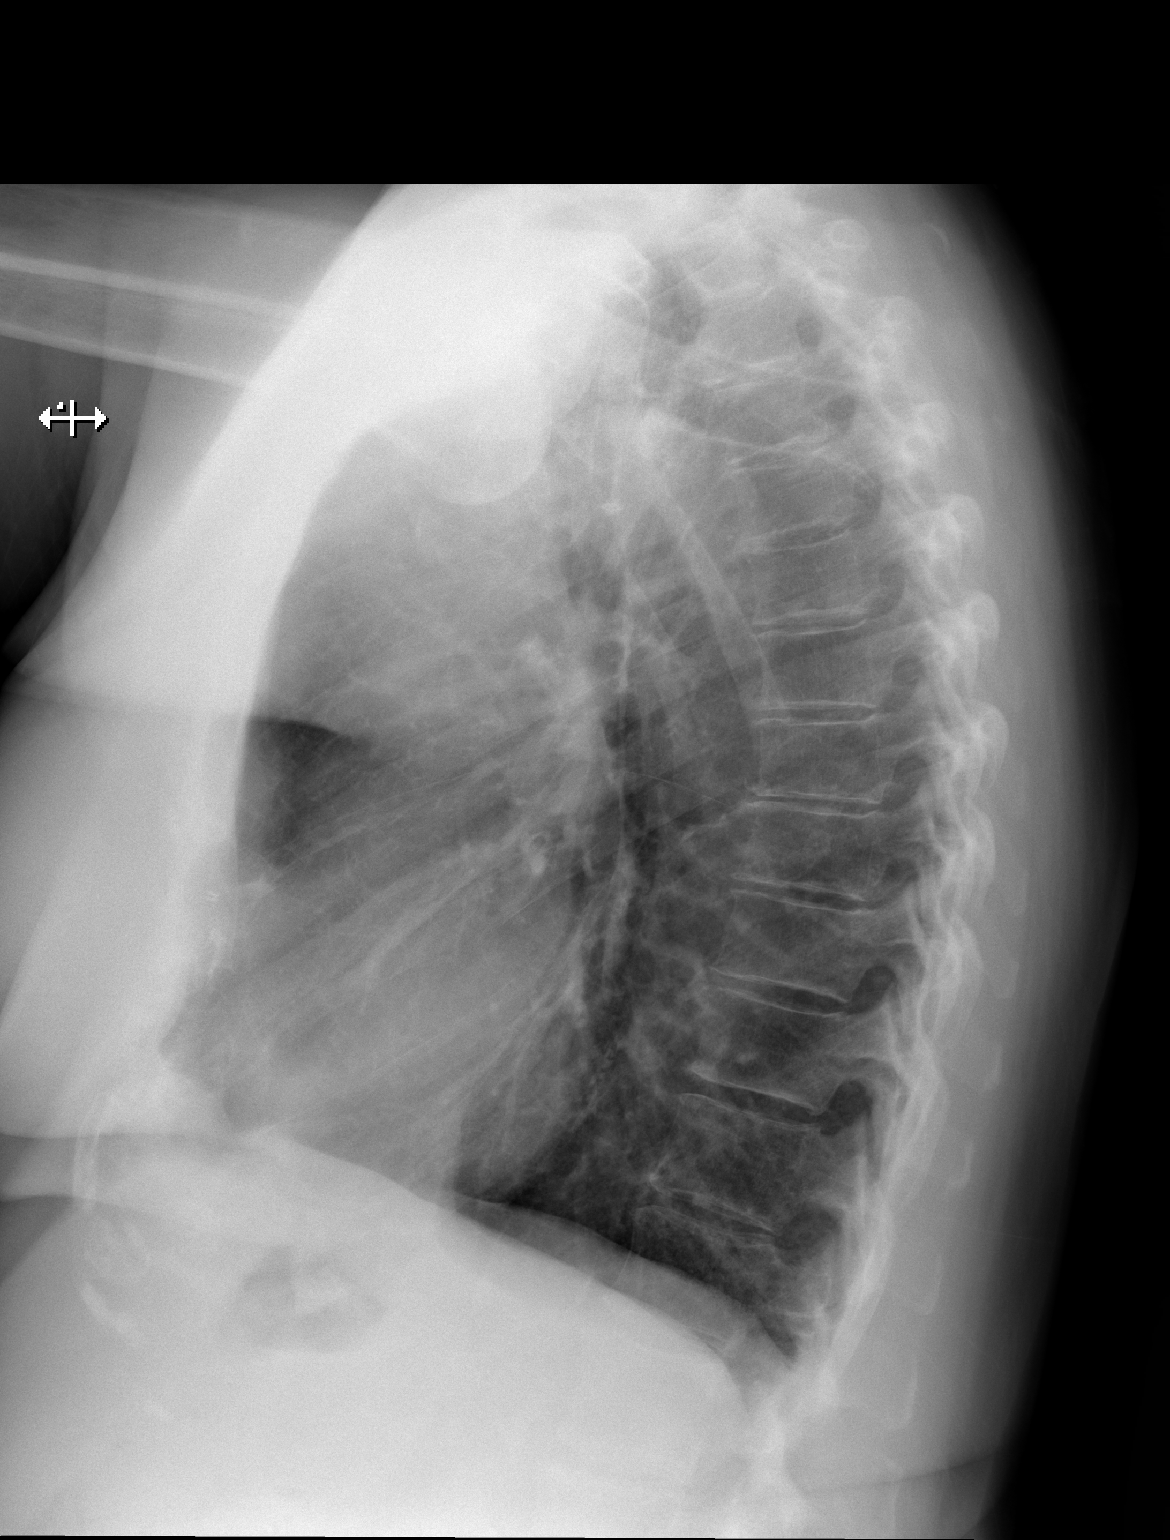

[2 of 2 positions shown; findings below may reference images not displayed]

FINDINGS: The heart size and mediastinal contours are within normal limits.
Both lungs are clear. The visualized skeletal structures are
unremarkable.
IMPRESSION: No active cardiopulmonary disease.

## 2023-12-03 IMAGING — US US ABDOMEN COMPLETE
1 series · 15 of 25 positions shown · non-contrast
Comparison: None.

CLINICAL DATA: Elevated liver enzymes

EXAM:
ABDOMEN ULTRASOUND COMPLETE

[Series 1: us abdomen complete mc & wl · 15 of 99 slices shown]
[im 1/99]
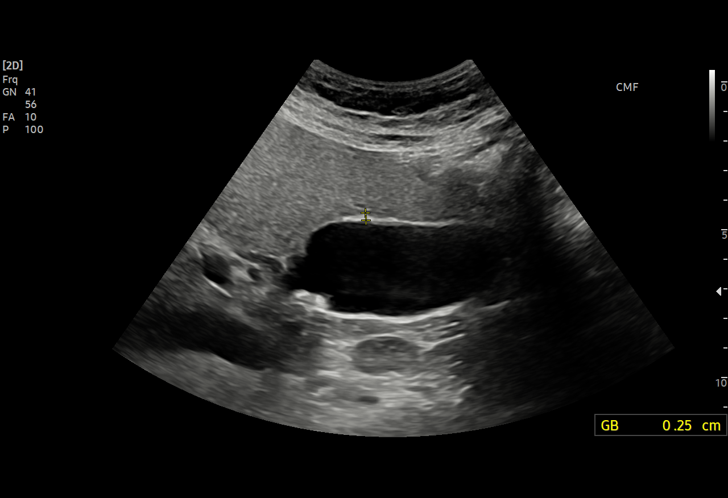
[im 9/99]
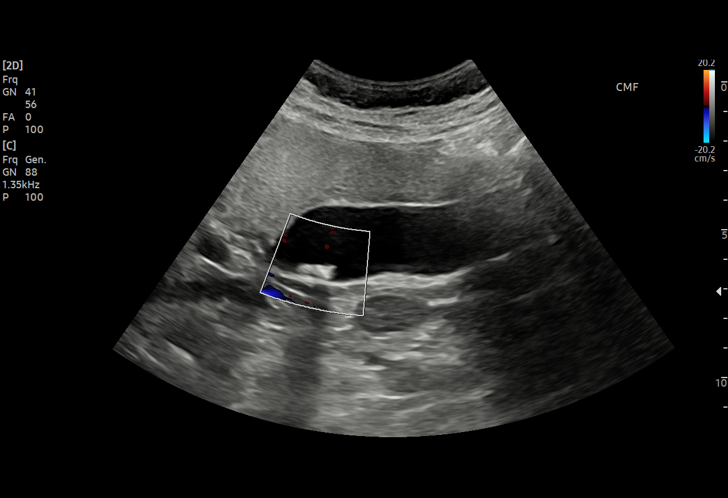
[im 17/99]
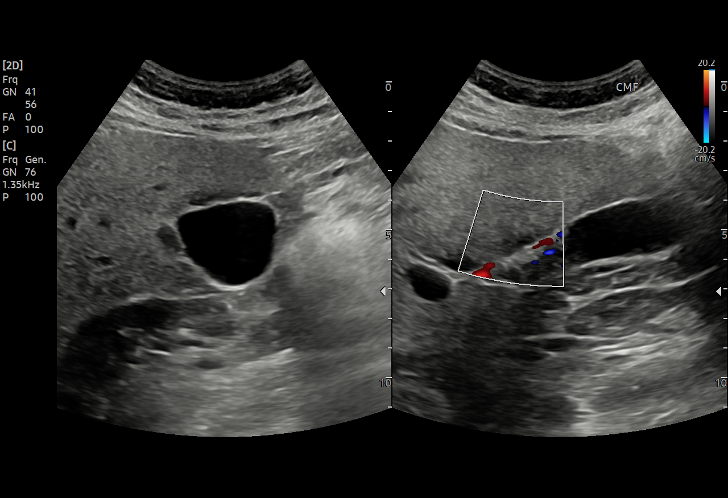
[im 21/99]
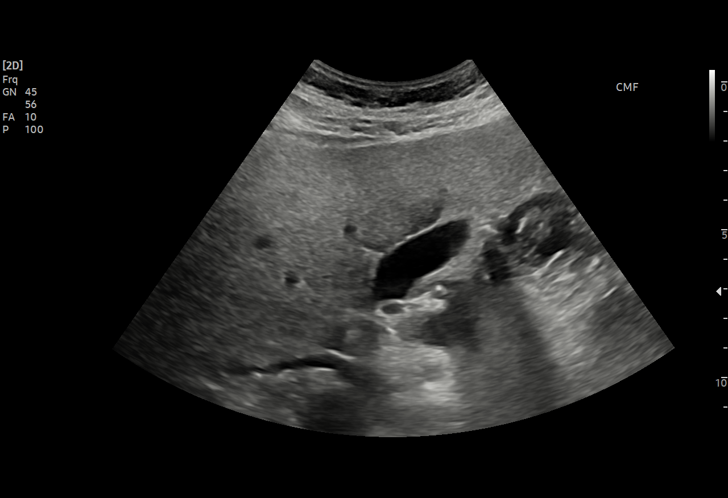
[im 29/99]
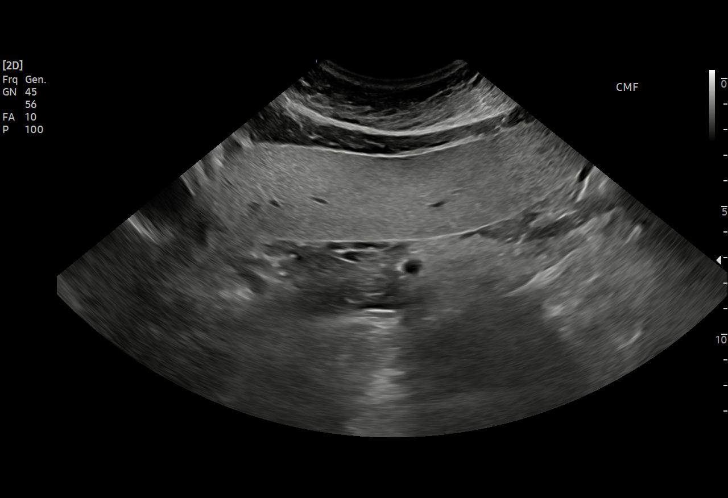
[im 37/99]
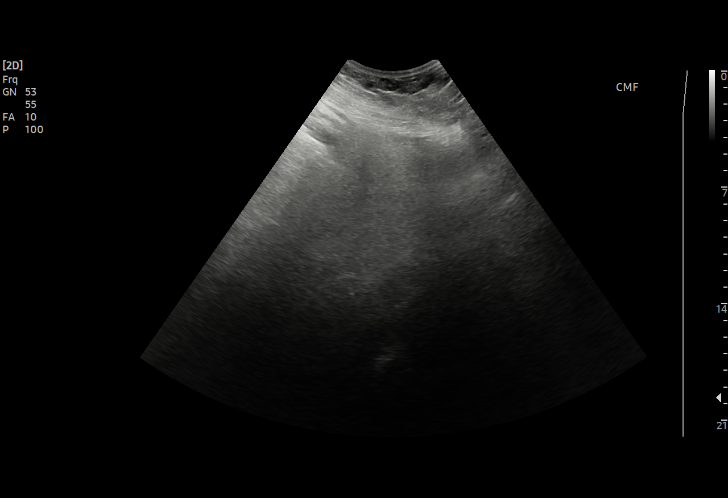
[im 41/99]
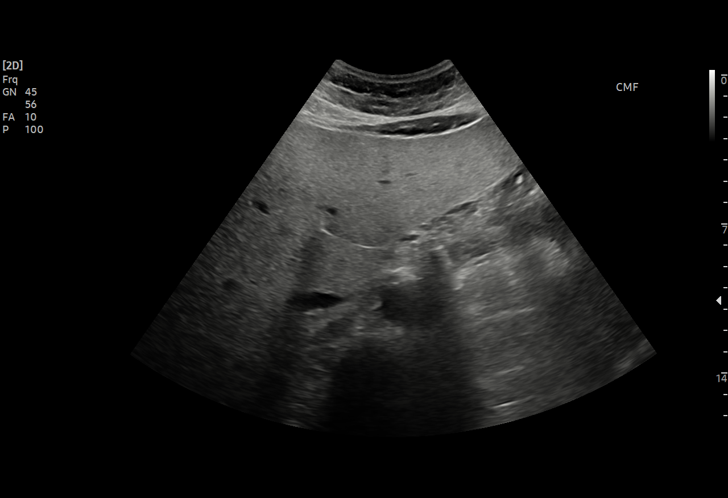
[im 50/99]
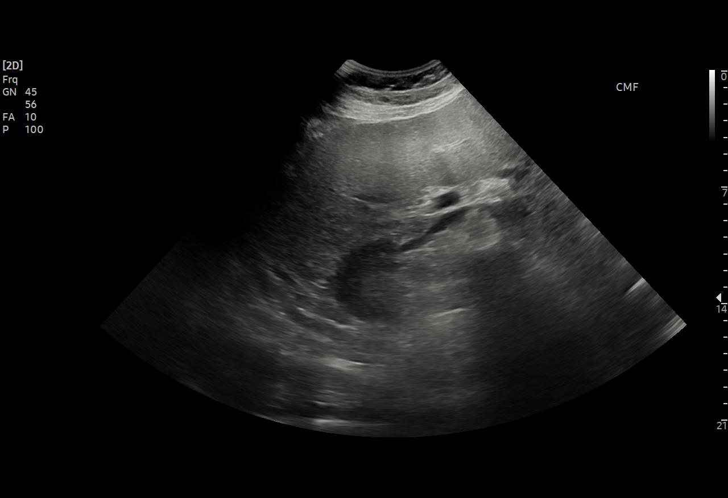
[im 58/99]
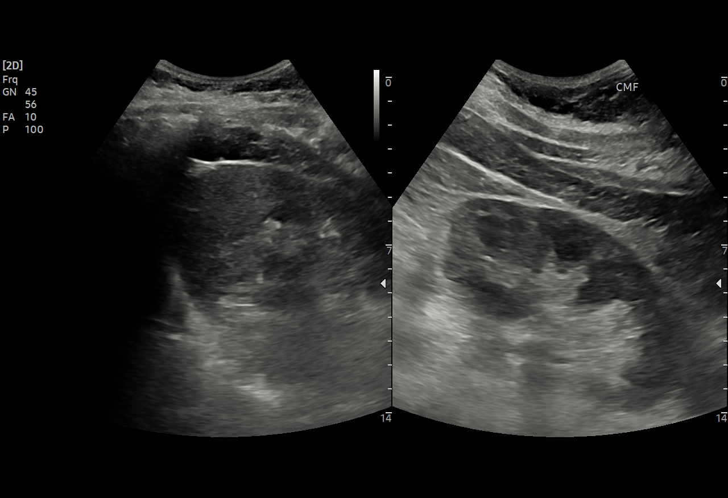
[im 62/99]
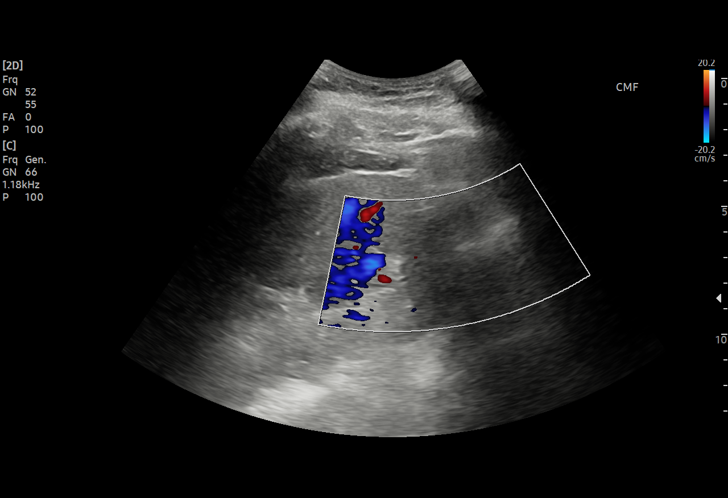
[im 70/99]
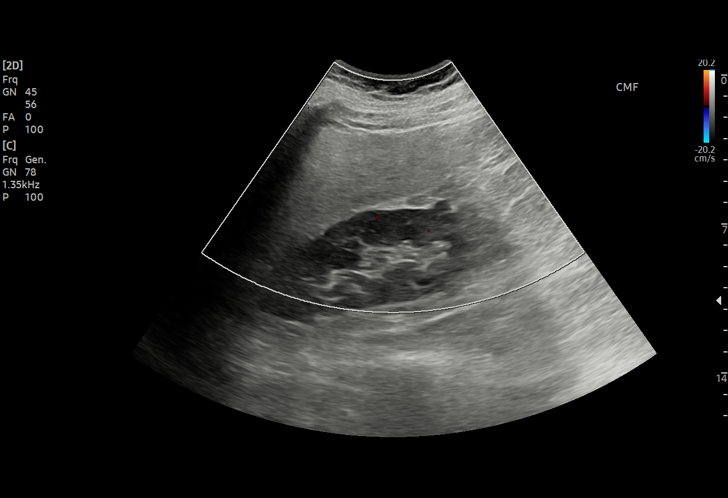
[im 78/99]
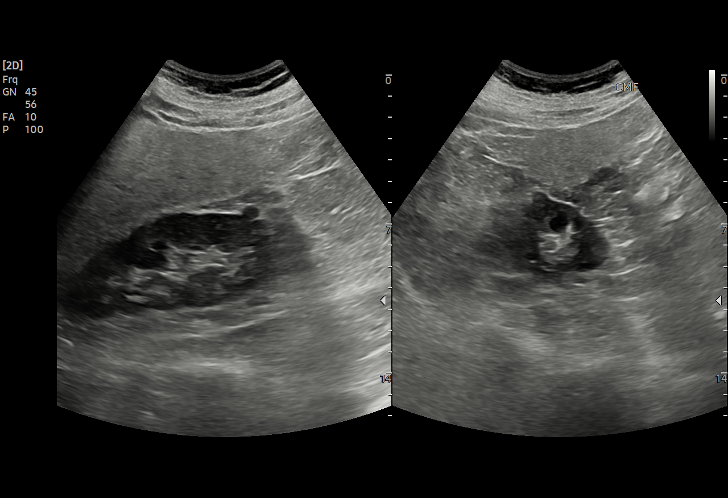
[im 82/99]
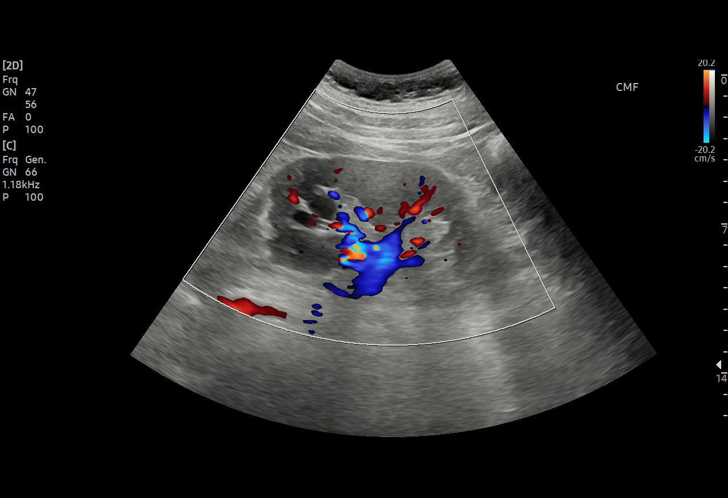
[im 90/99]
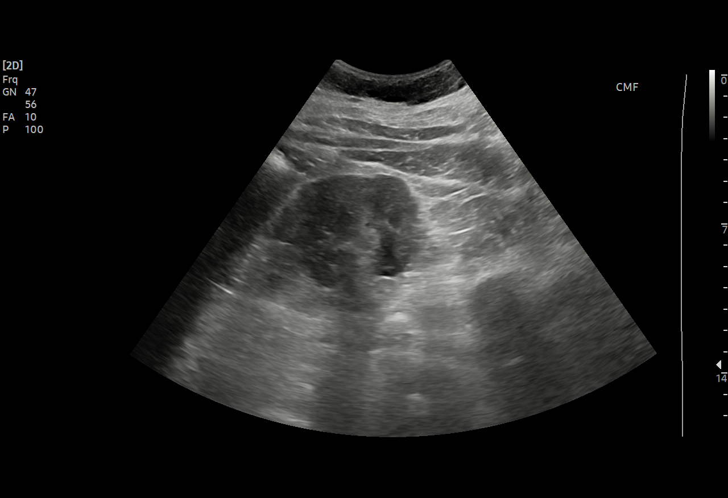
[im 99/99]
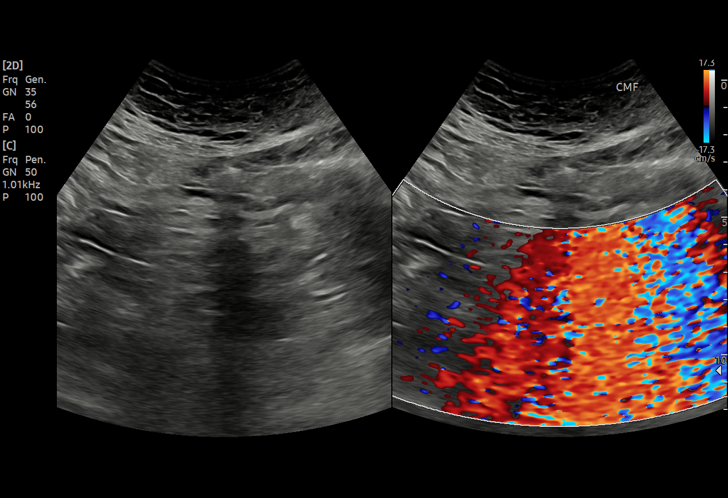

[15 of 25 positions shown; findings below may reference images not displayed]

FINDINGS: Gallbladder: 1.5 cm mobile stone within the gallbladder. No wall
thickening or sonographic Murphy sign.

Common bile duct: Diameter: Normal caliber, 2 mm

Liver: Increased echotexture compatible with fatty infiltration. No
focal abnormality or biliary ductal dilatation. Focal fatty sparing
adjacent to the gallbladder fossa. Portal vein is patent on color
Doppler imaging with normal direction of blood flow towards the
liver.

IVC: No abnormality visualized.

Pancreas: Visualized portion unremarkable.

Spleen: Size and appearance within normal limits.

Right Kidney: Length: 12.5 cm 8 mm cyst in the lower pole. Normal
echotexture. No hydronephrosis.

Left Kidney: Length: 12.3 cm. Echogenicity within normal limits. No
mass or hydronephrosis visualized.

Abdominal aorta: No aneurysm visualized.

Other findings: None.
IMPRESSION: Hepatic steatosis.

Cholelithiasis.  No sonographic evidence of acute cholecystitis.

## 2023-12-07 ENCOUNTER — Ambulatory Visit: Payer: Medicare Other | Admitting: Nurse Practitioner

## 2023-12-11 IMAGING — US US ABDOMEN COMPLETE
1 series · 14 of 25 positions shown · non-contrast
Comparison: Ultrasound 10/27/2021

CLINICAL DATA: Left upper quadrant pain

EXAM:
ABDOMEN ULTRASOUND COMPLETE

[Series 1: us abdomen complete · 14 of 120 slices shown]
[im 1/120]
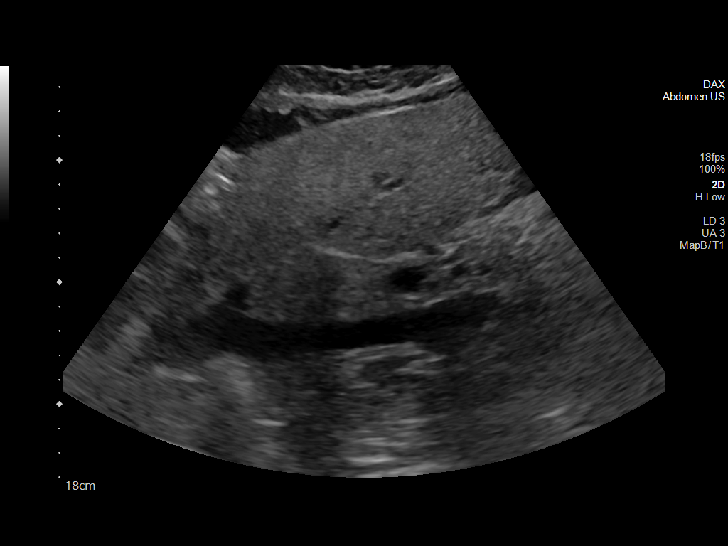
[im 10/120]
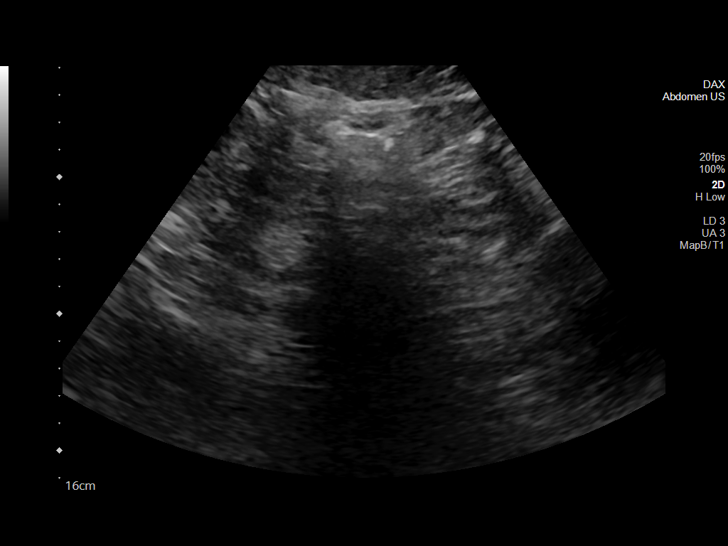
[im 20/120]
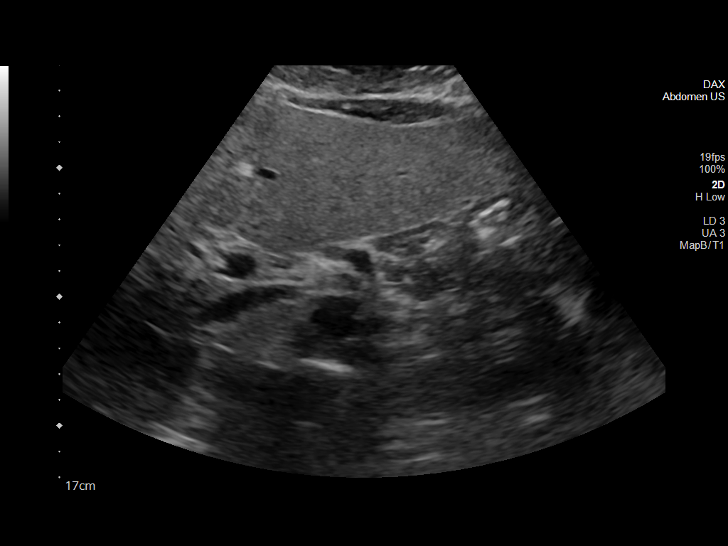
[im 30/120]
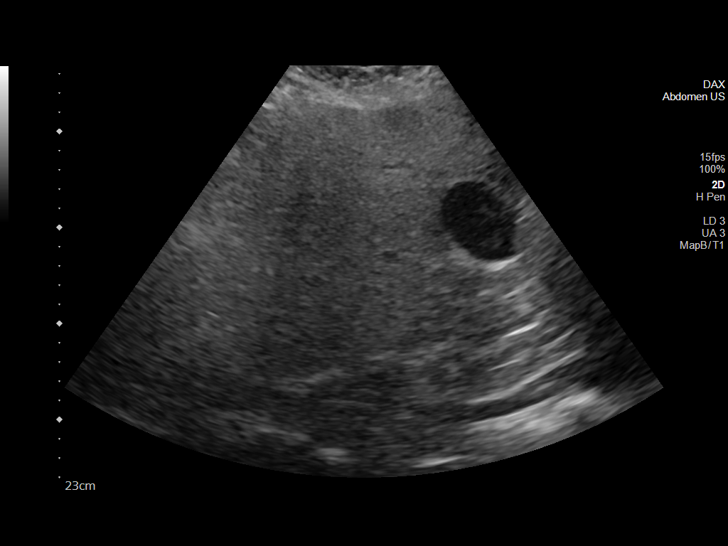
[im 40/120]
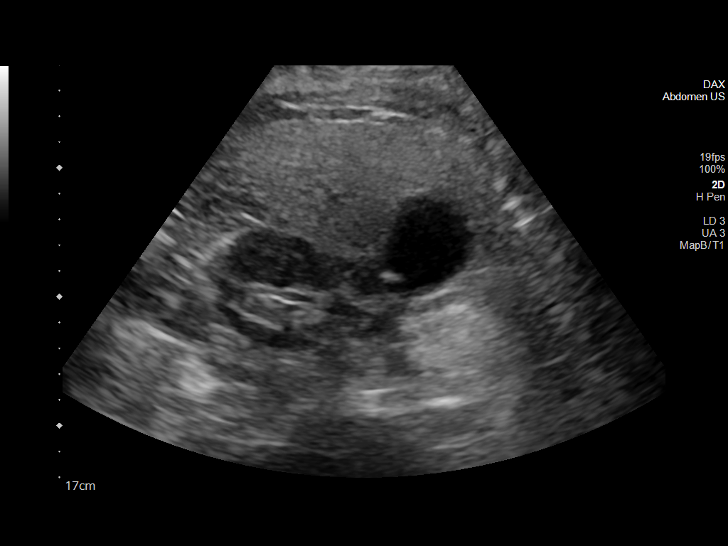
[im 45/120]
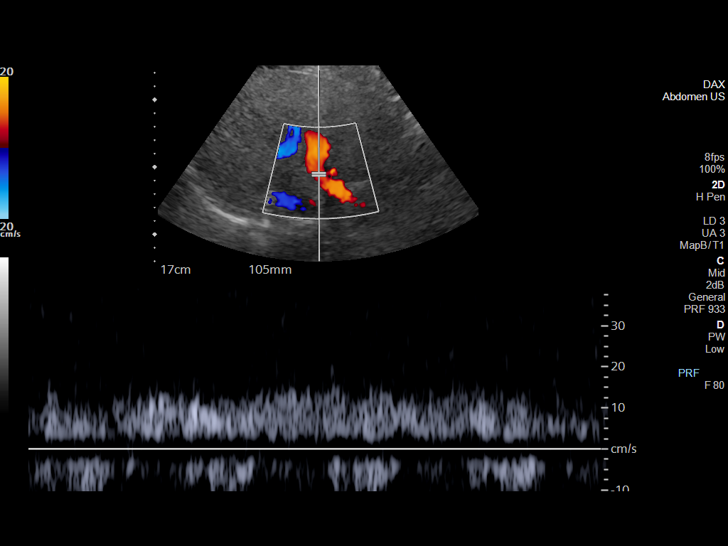
[im 55/120]
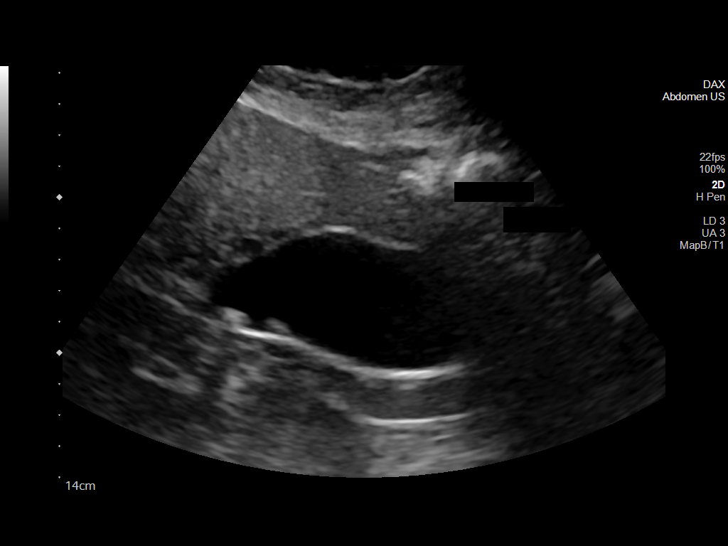
[im 65/120]
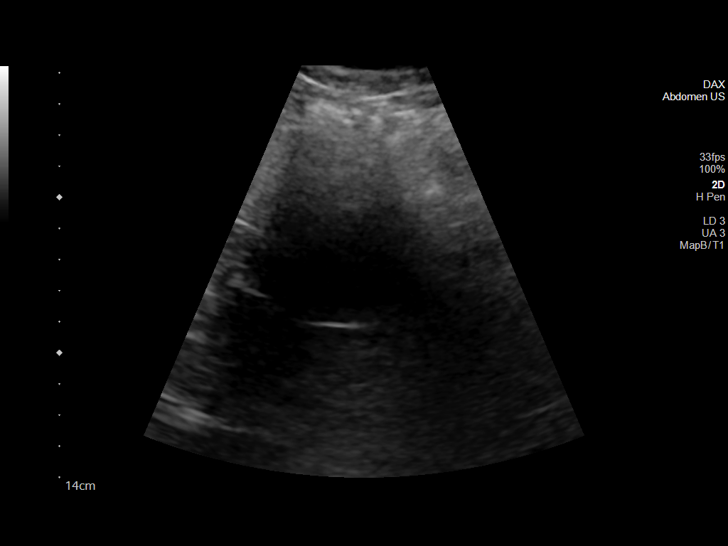
[im 75/120]
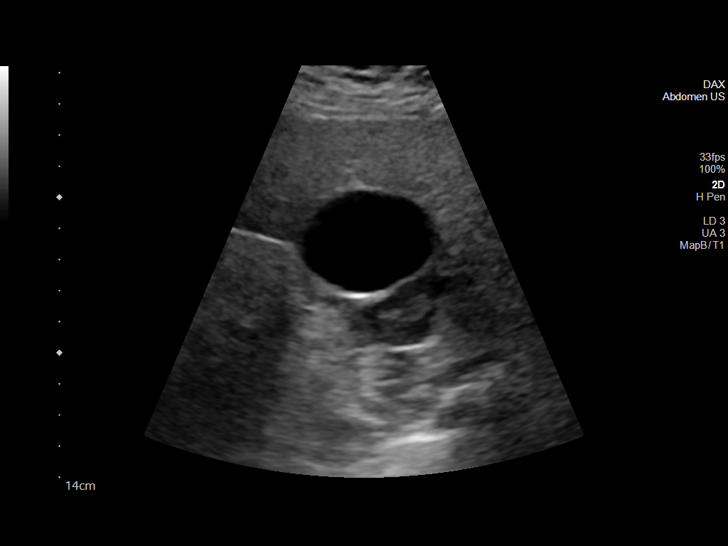
[im 80/120]
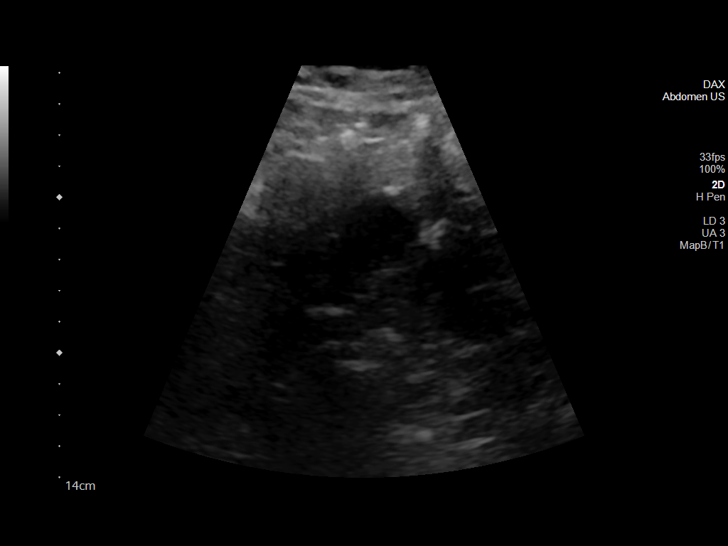
[im 90/120]
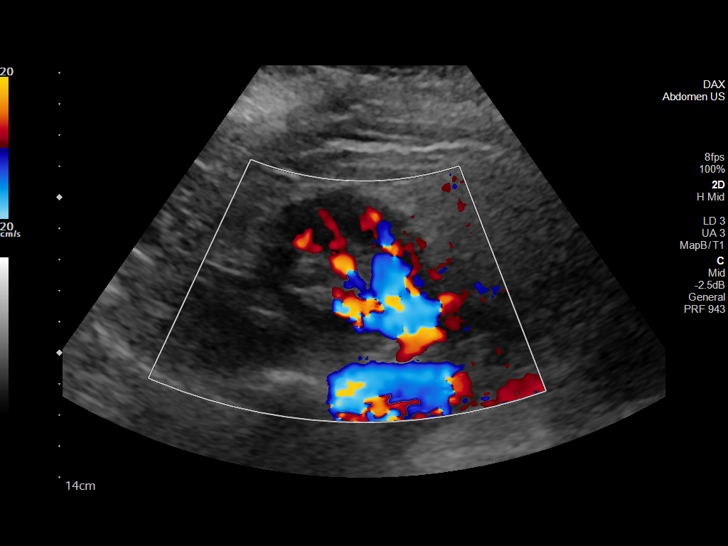
[im 100/120]
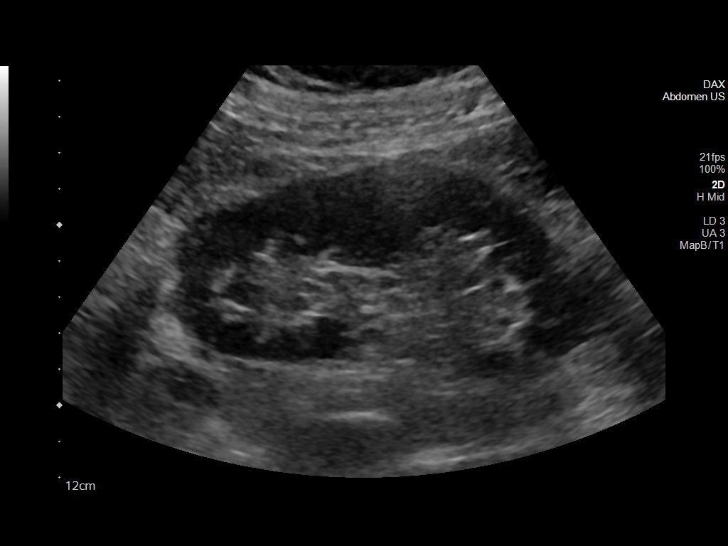
[im 110/120]
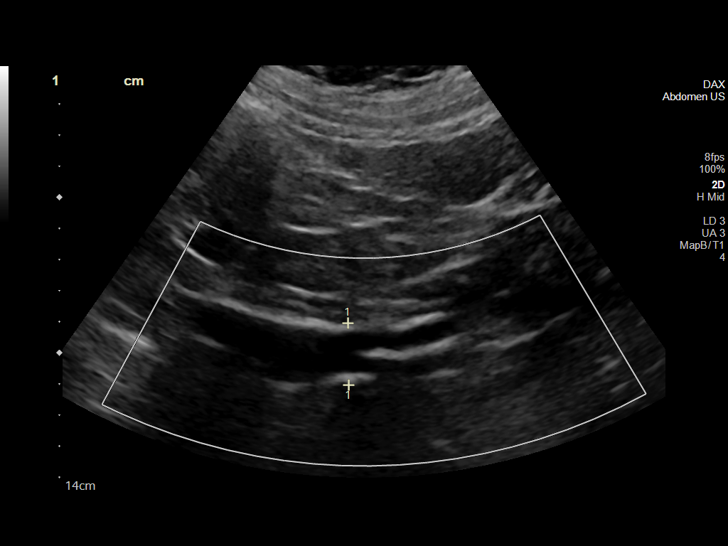
[im 120/120]
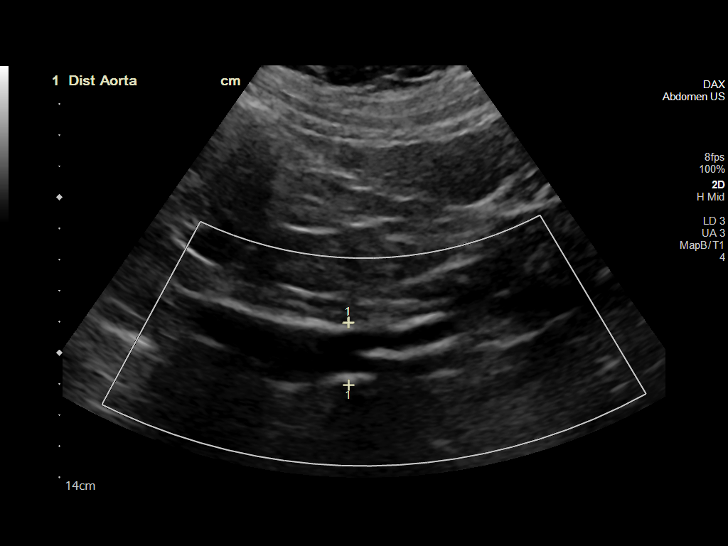

[14 of 25 positions shown; findings below may reference images not displayed]

FINDINGS: Gallbladder: 2 mobile gallstones measuring up to 11 mm. No wall
thickening or sonographic Murphy sign.

Common bile duct: Diameter: Normal caliber, 7 mm

Liver: Increased echotexture compatible with fatty infiltration. No
focal abnormality or biliary ductal dilatation. Portal vein is
patent on color Doppler imaging with normal direction of blood flow
towards the liver.

IVC: No abnormality visualized.

Pancreas: Visualized portion unremarkable.

Spleen: Size and appearance within normal limits.

Right Kidney: Length: 13.3 cm. Echogenicity within normal limits. No
mass or hydronephrosis visualized.

Left Kidney: Length: 11.7 cm. Echogenicity within normal limits. No
mass or hydronephrosis visualized.

Abdominal aorta: No aneurysm visualized.

Other findings: None.
IMPRESSION: Cholelithiasis.  No sonographic evidence of acute cholecystitis.

Hepatic steatosis.

## 2023-12-13 ENCOUNTER — Other Ambulatory Visit: Payer: Self-pay | Admitting: Nurse Practitioner

## 2023-12-13 DIAGNOSIS — C50812 Malignant neoplasm of overlapping sites of left female breast: Secondary | ICD-10-CM

## 2023-12-13 NOTE — Assessment & Plan Note (Signed)
 stage IA (mpT1bN0), ER+/PR+/HER2- -diagnosed in treated in [place] . S/p neoadjuvant anastrozole for 6-8 weeks, b/l mastectomy and reconstruction with implants.  -She has been on adjuvant AI since 02/2019. She has tried all four standard antiestrogen therapy options but did not tolerate due to joint pain and hot flashes. She discontinued 11/2021 and proceeded with surveillance alone -No role for mammograms after b/l mastectomy -[name] is clinically doing well.  Exam is benign. No clinical concern for recurrence -Follow-up with me in 1 year, or sooner if needed

## 2023-12-13 NOTE — Progress Notes (Unsigned)
 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 S/p neoadjuvant anastrozole for 6-8 weeks, b/l mastectomy and reconstruction with implants.  -She has been on adjuvant AI since 02/2019. She has tried all four standard antiestrogen therapy options but did not tolerate due to joint pain and hot flashes. She discontinued 11/2021 and proceeded with surveillance alone -No role for mammograms after b/l mastectomy -[name] is clinically doing well.  Exam is benign. No clinical concern for recurrence -Follow-up with me in 1 year, or sooner if needed   Arterial atherosclerosis Since last visit, patient and stents placed in left iliac artery due to reduced circulation and leg pain.  Discovered by her orthopedic provider.  Pain has improved.  She is now on blood thinner.  Continues to see vein and vascular on routine basis.  Hot flashes Reported these to her GYN since her last visit.  Restarted on gabapentin .  She is taking 300 mg at bedtime which helps a considerable amount.  Plan Patient prefers to have labs done next month per her primary care provider.  She will "cc" me with results so that I can review them.  Prior results have been unremarkable. Bilateral mastectomy with implants.  Clinical exam benign. Continue regular visits with GYN, vascular, and cardiology as scheduled. Follow-up with labs in 1 year.  The patient understands the plans discussed today and is in agreement with them.  She knows to contact our office if she develops concerns prior to her next appointment.  I provided 25 minutes of face-to-face time  during this encounter and > 50% was spent counseling as documented under my assessment and plan.    Sharyon Deis, NP  [facility] - A DEPT OF [facility] [address] [place] [address] Dept: [phone] Dept Fax: [phone]   No orders of the defined types were placed in this encounter.     CHIEF COMPLAINT:  CC: Left breast cancer, estrogen receptor positive  Current Treatment: Surveillance.  Antiestrogen therapy was started in July 2020.  Most recently was on tamoxifen  from October 2021 through April 2023.  Stopped due to joint pain and hot flashes.  INTERVAL HISTORY:  Victoria Wolfe is here today for repeat clinical assessment.  She was last seen by Lacie, NP on 12/07/2022.  Since last year, the patient was found to have nearly 90% blockage in left iliac artery.  This was discovered by her orthopedic.  Patient was having pain upon walking even short distances.  MRI showed blockage.  She has since seen vascular surgeon.  Had 2 stents placed in left iliac artery which has improved the pain and weakness.  She is taking anticoagulant every day.  She has also seen her GYN provider.  Reported hot flashes and was started on gabapentin .  Not taking 300 mg at night which seems to help. She denies chest pain, chest pressure, or shortness of breath. She denies headaches or visual disturbances. She denies abdominal pain, nausea, vomiting, or changes in bowel or bladder habits.  She denies problems or concerns with the chest or chest wall,  or either axillary region.  She denies fevers or chills. She denies pain. Her appetite is good. Her weight has been stable.  I have reviewed the past medical history, past surgical history, social history and family history with the patient and they are unchanged from previous note.  ALLERGIES:  is allergic to amoxicillin  and zithromax  [azithromycin ].  MEDICATIONS:  Current Outpatient Medications   Medication Sig Dispense Refill   amLODipine  (NORVASC ) 5 MG tablet Take 1 tablet (5 mg total) by mouth daily. 90 tablet 3   aspirin  EC (ASPIRIN  ADULT LOW DOSE) 81 MG tablet Take 81 mg by mouth once.     calcium  carbonate (OSCAL) 1500 (600 Ca) MG TABS tablet Take 2 tablets (3,000 mg total) by mouth daily with breakfast.     candesartan -hydrochlorothiazide (ATACAND  HCT) 32-12.5 MG tablet TAKE 1 TABLET BY MOUTH EVERY DAY 90 tablet 1   cholecalciferol (VITAMIN D3) 25 MCG (1000 UNIT) tablet Take 1,000 Units by mouth daily.     clopidogrel  (PLAVIX ) 75 MG tablet Take 1 tablet (75 mg total) by mouth daily. 30 tablet 11   Cyanocobalamin (VITAMIN B-12) 1000 MCG SUBL Take 1,000 mcg by mouth daily.     esomeprazole (NEXIUM) 20 MG capsule Take 20 mg by mouth daily at 12 noon.     ezetimibe  (ZETIA ) 10 MG tablet Take 1 tablet (10 mg total) by mouth daily. 90 tablet 3   gabapentin  (NEURONTIN ) 100 MG capsule Take 1 capsule (100 mg total) by mouth at bedtime for 7 days, THEN 2 capsules (200 mg total) at bedtime for 7 days, THEN 3 capsules (300 mg total) at bedtime. 249 capsule 0   metFORMIN  (GLUCOPHAGE ) 500 MG tablet Take 1 tablet (500 mg total) by mouth 2 (two) times daily with a meal. 180 tablet 3   metoprolol  succinate (TOPROL  XL) 25 MG 24 hr tablet Take 0.5 tablets (12.5 mg total) by mouth daily. 45 tablet 3   nystatin  (MYCOSTATIN /NYSTOP ) powder Apply 1 Application topically 3 (three) times daily. 15 g 3   Polyethyl Glycol-Propyl Glycol (SYSTANE) 0.4-0.3 % SOLN Place 1 drop into both eyes every morning.     psyllium (METAMUCIL) 58.6 % packet Take 1 packet by mouth daily.     rosuvastatin  (CRESTOR ) 20 MG tablet TAKE 1 TABLET BY MOUTH EVERY DAY 90 tablet 3   Saline 0.9 % AERS Place 1 spray into the nose daily at 2 am. Simply Saline     vitamin C  (ASCORBIC ACID ) 500 MG tablet Take 500 mg by mouth daily.     BIOTIN 5000 PO Take 10,000 capsules by mouth daily.     TURMERIC PO Take 538 mg by mouth daily.     No  current facility-administered medications for this visit.    HISTORY OF PRESENT ILLNESS:   Oncology History Overview Note  Cancer Staging Cancer of overlapping sites of left breast [facility]) Staging form: Breast, AJCC 8th Edition - Clinical stage from 09/27/2018: Stage IA (cT1b, cN0, cM0, G2, ER+, PR+, HER2-) - Signed by Sonja [place], MD on 12/07/2019 - Pathologic stage from 02/02/2019: Stage IA (pT1b, pN0, cM0, G2, ER+, PR+, HER2-) - Signed by Sonja [place] Neighbors, MD on 12/07/2019     Cancer of overlapping sites of left breast (HCC)  09/27/2018 Cancer Staging   Staging form: Breast, AJCC 8th Edition - Clinical stage from 09/27/2018: Stage IA (cT1b, cN0, cM0, G2, ER+, PR+, HER2-) - Signed by Sonja , MD on 12/07/2019   10/04/2018 Mammogram   10/04/18 B/l mammogram - multifocal  malignancy left breast with one prominent axillary node. Lesions are seperated by at least 3cm, but additional disease is not excluded indeterminate right enhancement-additional spot mammographic imaging and US  advised to locate for sampling. Birads 6.   10/07/2018 Mammogram   10/07/18 - mammogram suspicious of malignancy. the nodule in the right breast middle depth lateral region seen on the craniocausal view only is at alow suspicion for malignancy. a stereotactic biopsy is reocmnended. Birads 4a low suspicions for malignancy    10/07/2018 Breast US    10/07/18 B/L US  - known biopsy proven malignancy. The mass in the lift breast at 11: 00 positoin psterior depth is a known biopsy positive for malignancy. Birads 6 known malignancy    10/18/2018 Initial Diagnosis   Bilateral malignant neoplasm of breast in female [facility])   10/19/2018 Initial Biopsy   09/27/2018 Left breast biopsy  -11:00. 5cm from nipple: invasive ductal carcinoma, ER96%+, PR 98%+, HER2-, Ki67 25% -Left breast, upper lesion: invasive ductal carcinoma, ER96%+, PR 98%+, HER2-, Ki67 20% 10/19/18 Right breast core biopsy -Bening fibroadipose breast tissue with focal usual ductal  hyperplasia.     2020 Genetic Testing   Genetc testing negative    11/2018 - 01/2019 Neo-Adjuvant Anti-estrogen oral therapy   Neoadjuvant Anastrozole once daily for 6-8 weeks and had great response to treatment. Did not tolerate well.    02/02/2019 Surgery   B/l total mastectomy with Sentinel Node Biopsy by Dr. Sharlett Day 12/08/18 PATHOLOGY: 2 left axillary sentinel lymph node were negative for malignant cells. Left breast mastectomy: Invasive and in situ ductal carcinoma, 7mm, associated with metal clip and prior biopsy site change, at the 11:00-12:00 position; a second metal clip is present at the 8:00 to 9 o'clock position, no evidence of malignancy Right breast mastectomy: Benign breast tissue   02/02/2019 Cancer Staging   Staging form: Breast, AJCC 8th Edition - Pathologic stage from 02/02/2019: Stage IA (pT1b, pN0, cM0, G2, ER+, PR+, HER2-) - Signed by Sonja Shedd, MD on 12/07/2019   02/2019 -  Anti-estrogen oral therapy   Letrozole once daily for 2 months since the end of 02/2019-04/2019. Did not tolerate well and was switched to Exemestane  once daily in 04/2019. Switched to Tamoxifen  in 05/2020 due to worsened joint pain.    06/30/2019 Surgery   B/l Breast Reconstruction with Dr. Fuller Jo 06/30/19   10/17/2019 Imaging   DEXA ASSESSMENT: The BMD measured at Femur Neck Left is 0.982 g/cm2 with a T-score of -0.4. This patient is considered normal according to World Health Organization [facility]) criteria.       REVIEW OF SYSTEMS:   Constitutional: Denies fevers, chills or abnormal weight loss Eyes: Denies blurriness of vision Ears, nose, mouth, throat, and face: Denies mucositis or sore throat Respiratory: Denies cough, dyspnea or wheezes Cardiovascular: Denies palpitation, chest discomfort or lower extremity swelling Gastrointestinal:  Denies nausea, heartburn or change in bowel habits Skin: Denies abnormal skin rashes Lymphatics: Denies new lymphadenopathy or easy  bruising Neurological:Denies numbness, tingling or new weaknesses Behavioral/Psych: Mood is stable, no new changes  All other systems were reviewed with the patient and are negative.   VITALS:   Today's Vitals   12/14/23 0927  BP: 138/70  Pulse: 75  Resp: 17  Temp: 97.7 F (36.5 C)  SpO2: 98%  Weight: 185 lb 3.2 oz (84 kg)  PainSc: 0-No pain   Body mass index is 28.37 kg/m.   Wt Readings from Last 3 Encounters:  12/14/23 185 lb 3.2 oz (84 kg)  11/19/23 183  lb (83 kg)  11/11/23 184 lb (83.5 kg)    Body mass index is 28.37 kg/m.  Performance status (ECOG): 0 - Asymptomatic  PHYSICAL EXAM:   GENERAL:alert, no distress and comfortable SKIN: skin color, texture, turgor are normal, no rashes or significant lesions EYES: normal, Conjunctiva are pink and non-injected, sclera clear OROPHARYNX:no exudate, no erythema and lips, buccal mucosa, and tongue normal  NECK: supple, thyroid normal size, non-tender, without nodularity LYMPH:  no palpable lymphadenopathy in the cervical, axillary or inguinal LUNGS: clear to auscultation and percussion with normal breathing effort HEART: regular rate & rhythm and no murmurs and no lower extremity edema ABDOMEN:abdomen soft, non-tender and normal bowel sounds Musculoskeletal:no cyanosis of digits and no clubbing  NEURO: alert & oriented x 3 with fluent speech, no focal motor/sensory deficits BREAST: Bilateral total mastectomy with implants.  Surgical removal of bilateral nipples.  No masses or lumps noted in the chest wall.  No axillary lymphadenopathy, bilaterally.   LABORATORY DATA:  I have reviewed the data as listed    Component Value Date/Time   NA 143 06/28/2023 0606   NA 141 09/24/2022 0000   K 3.8 06/28/2023 0606   CL 105 06/28/2023 0606   CO2 25 05/30/2023 0923   GLUCOSE 139 (H) 06/28/2023 0606   BUN 17 06/28/2023 0606   BUN 15 09/24/2022 0000   CREATININE 0.60 06/28/2023 0606   CREATININE 0.74 12/07/2022 0957    CREATININE 0.67 11/04/2021 1446   CALCIUM  9.4 05/30/2023 0923   PROT 7.0 05/30/2023 0923   ALBUMIN 4.5 09/14/2023 0000   AST 36 (A) 09/14/2023 0000   AST 45 (H) 12/07/2022 0957   ALT 48 (A) 09/14/2023 0000   ALT 55 (H) 12/07/2022 0957   ALKPHOS 81 09/14/2023 0000   BILITOT 0.6 05/30/2023 0923   BILITOT 0.7 12/07/2022 0957   GFRNONAA >60 05/30/2023 0923   GFRNONAA >60 12/07/2022 0957   Lab Results  Component Value Date   WBC 9.2 05/30/2023   NEUTROABS 6.8 05/30/2023   HGB 11.6 (L) 06/28/2023   HCT 34.0 (L) 06/28/2023   MCV 93.1 05/30/2023   PLT 226 05/30/2023

## 2023-12-14 ENCOUNTER — Encounter: Payer: Self-pay | Admitting: Nurse Practitioner

## 2023-12-14 ENCOUNTER — Other Ambulatory Visit

## 2023-12-14 ENCOUNTER — Inpatient Hospital Stay: Payer: Medicare Other | Attending: Nurse Practitioner | Admitting: Nurse Practitioner

## 2023-12-14 VITALS — BP 138/70 | HR 75 | Temp 97.7°F | Resp 17 | Wt 185.2 lb

## 2023-12-14 DIAGNOSIS — C50812 Malignant neoplasm of overlapping sites of left female breast: Secondary | ICD-10-CM

## 2023-12-14 DIAGNOSIS — I700 Atherosclerosis of aorta: Secondary | ICD-10-CM | POA: Insufficient documentation

## 2023-12-14 DIAGNOSIS — Z170 Estrogen receptor positive status [ER+]: Secondary | ICD-10-CM | POA: Diagnosis not present

## 2023-12-14 DIAGNOSIS — Z7984 Long term (current) use of oral hypoglycemic drugs: Secondary | ICD-10-CM | POA: Diagnosis not present

## 2023-12-14 DIAGNOSIS — Z79810 Long term (current) use of selective estrogen receptor modulators (SERMs): Secondary | ICD-10-CM | POA: Insufficient documentation

## 2023-12-14 DIAGNOSIS — R232 Flushing: Secondary | ICD-10-CM | POA: Diagnosis not present

## 2023-12-14 DIAGNOSIS — Z9013 Acquired absence of bilateral breasts and nipples: Secondary | ICD-10-CM | POA: Insufficient documentation

## 2023-12-14 DIAGNOSIS — Z7902 Long term (current) use of antithrombotics/antiplatelets: Secondary | ICD-10-CM | POA: Diagnosis not present

## 2023-12-14 DIAGNOSIS — Z79899 Other long term (current) drug therapy: Secondary | ICD-10-CM | POA: Diagnosis not present

## 2023-12-20 ENCOUNTER — Encounter (HOSPITAL_BASED_OUTPATIENT_CLINIC_OR_DEPARTMENT_OTHER): Payer: Self-pay | Admitting: Emergency Medicine

## 2023-12-20 ENCOUNTER — Emergency Department (HOSPITAL_BASED_OUTPATIENT_CLINIC_OR_DEPARTMENT_OTHER): Admitting: Radiology

## 2023-12-20 ENCOUNTER — Emergency Department (HOSPITAL_BASED_OUTPATIENT_CLINIC_OR_DEPARTMENT_OTHER)
Admission: EM | Admit: 2023-12-20 | Discharge: 2023-12-20 | Disposition: A | Discharge status: Home / Self Care | Attending: Emergency Medicine | Admitting: Emergency Medicine

## 2023-12-20 ENCOUNTER — Other Ambulatory Visit: Payer: Self-pay

## 2023-12-20 DIAGNOSIS — R059 Cough, unspecified: Secondary | ICD-10-CM | POA: Diagnosis not present

## 2023-12-20 DIAGNOSIS — Z7984 Long term (current) use of oral hypoglycemic drugs: Secondary | ICD-10-CM | POA: Insufficient documentation

## 2023-12-20 DIAGNOSIS — Z79899 Other long term (current) drug therapy: Secondary | ICD-10-CM | POA: Insufficient documentation

## 2023-12-20 DIAGNOSIS — Z87891 Personal history of nicotine dependence: Secondary | ICD-10-CM | POA: Diagnosis not present

## 2023-12-20 DIAGNOSIS — Z7982 Long term (current) use of aspirin: Secondary | ICD-10-CM | POA: Diagnosis not present

## 2023-12-20 DIAGNOSIS — I10 Essential (primary) hypertension: Secondary | ICD-10-CM | POA: Diagnosis not present

## 2023-12-20 DIAGNOSIS — R051 Acute cough: Secondary | ICD-10-CM | POA: Insufficient documentation

## 2023-12-20 DIAGNOSIS — R519 Headache, unspecified: Secondary | ICD-10-CM | POA: Insufficient documentation

## 2023-12-20 DIAGNOSIS — Z853 Personal history of malignant neoplasm of breast: Secondary | ICD-10-CM | POA: Diagnosis not present

## 2023-12-20 DIAGNOSIS — Z7902 Long term (current) use of antithrombotics/antiplatelets: Secondary | ICD-10-CM | POA: Insufficient documentation

## 2023-12-20 DIAGNOSIS — E119 Type 2 diabetes mellitus without complications: Secondary | ICD-10-CM | POA: Insufficient documentation

## 2023-12-20 DIAGNOSIS — R0981 Nasal congestion: Secondary | ICD-10-CM | POA: Diagnosis not present

## 2023-12-20 LAB — RESP PANEL BY RT-PCR (RSV, FLU A&B, COVID)  RVPGX2
Influenza A by PCR: NEGATIVE
Influenza B by PCR: NEGATIVE
Resp Syncytial Virus by PCR: NEGATIVE
SARS Coronavirus 2 by RT PCR: NEGATIVE

## 2023-12-20 MED ORDER — BENZONATATE 100 MG PO CAPS: 100.0000 mg | ORAL_CAPSULE | Freq: Three times a day (TID) | ORAL | 0 refills | Status: DC | PRN

## 2023-12-20 MED ORDER — DOXYCYCLINE HYCLATE 100 MG PO CAPS: 100.0000 mg | ORAL_CAPSULE | Freq: Two times a day (BID) | ORAL | 0 refills | Status: DC

## 2023-12-20 MED ORDER — CEFDINIR 300 MG PO CAPS: 300.0000 mg | ORAL_CAPSULE | Freq: Two times a day (BID) | ORAL | 0 refills | Status: DC

## 2023-12-20 MED ORDER — TRIAMCINOLONE ACETONIDE 55 MCG/ACT NA AERO: 2.0000 | INHALATION_SPRAY | Freq: Every day | NASAL | 0 refills | Status: DC

## 2023-12-20 NOTE — ED Notes (Signed)
 Pt from [facility] living [facility]

## 2023-12-20 NOTE — ED Provider Notes (Signed)
[place] EMERGENCY DEPARTMENT AT [facility] Provider Note   CSN: [id number] Arrival date & time: 12/20/23  2725     History  Chief Complaint  Patient presents with   Cough    Victoria Wolfe is a 78 y.o. female.   Cough   78 year old female presents to the emergency department with complaints of cough, sore throat, congestion.  Patient states she has had symptoms for the past 4 to 5 days.  Multiple potential sick exposure in the outpatient setting.  Denies any fevers, chills, shortness of breath, chest pain, abdominal pain, nausea, vomiting.  Has not tried any medications for her symptoms.  Presents emergency department for further assessment/evaluation.  Past medical history significant for diabetes mellitus type 2, breast cancer, hyperlipidemia, hypertension, GERD, PAD  Home Medications Prior to Admission medications   Medication Sig Start Date End Date Taking? Authorizing Provider  benzonatate  (TESSALON ) 100 MG capsule Take 1 capsule (100 mg total) by mouth 3 (three) times daily as needed. 12/20/23  Yes Neil Balls A, PA  cefdinir (OMNICEF) 300 MG capsule Take 1 capsule (300 mg total) by mouth 2 (two) times daily. 12/20/23  Yes Neil Balls A, PA  doxycycline  (VIBRAMYCIN ) 100 MG capsule Take 1 capsule (100 mg total) by mouth 2 (two) times daily. 12/20/23  Yes Neil Balls A, PA  triamcinolone (NASACORT) 55 MCG/ACT AERO nasal inhaler Place 2 sprays into the nose daily. 12/20/23  Yes Neil Balls A, PA  amLODipine  (NORVASC ) 5 MG tablet Take 1 tablet (5 mg total) by mouth daily. 07/12/23   Marguerite Shiley, MD  aspirin  EC (ASPIRIN  ADULT LOW DOSE) 81 MG tablet Take 81 mg by mouth once. 04/21/23   [provider]  calcium  carbonate (OSCAL) 1500 (600 Ca) MG TABS tablet Take 2 tablets (3,000 mg total) by mouth daily with breakfast. 09/20/23   Raylene Calamity, NP  candesartan -hydrochlorothiazide (ATACAND  HCT) 32-12.5 MG tablet TAKE 1 TABLET BY MOUTH EVERY DAY  11/15/23   Gupta, Anjali L, MD  cholecalciferol (VITAMIN D3) 25 MCG (1000 UNIT) tablet Take 1,000 Units by mouth daily.    [provider]  clopidogrel  (PLAVIX ) 75 MG tablet Take 1 tablet (75 mg total) by mouth daily. 06/28/23 06/27/24  Adine Hoof, MD  Cyanocobalamin (VITAMIN B-12) 1000 MCG SUBL Take 1,000 mcg by mouth daily. 03/21/20   [provider]  esomeprazole (NEXIUM) 20 MG capsule Take 20 mg by mouth daily at 12 noon.    [provider]  ezetimibe  (ZETIA ) 10 MG tablet Take 1 tablet (10 mg total) by mouth daily. 11/11/23 02/09/24  Tobb, Kardie, DO  gabapentin  (NEURONTIN ) 100 MG capsule Take 1 capsule (100 mg total) by mouth at bedtime for 7 days, THEN 2 capsules (200 mg total) at bedtime for 7 days, THEN 3 capsules (300 mg total) at bedtime. 11/19/23 02/17/24  Romaine Closs, MD  metFORMIN  (GLUCOPHAGE ) 500 MG tablet Take 1 tablet (500 mg total) by mouth 2 (two) times daily with a meal. 05/21/23   Marguerite Shiley, MD  metoprolol  succinate (TOPROL  XL) 25 MG 24 hr tablet Take 0.5 tablets (12.5 mg total) by mouth daily. 10/29/23   Hassan Links, MD  nystatin  (MYCOSTATIN /NYSTOP ) powder Apply 1 Application topically 3 (three) times daily. 10/28/23   Marguerite Shiley, MD  Polyethyl Glycol-Propyl Glycol (SYSTANE) 0.4-0.3 % SOLN Place 1 drop into both eyes every morning.    [provider]  psyllium (METAMUCIL) 58.6 % packet Take 1 packet by mouth daily.  [provider]  rosuvastatin  (CRESTOR ) 20 MG tablet TAKE 1 TABLET BY MOUTH EVERY DAY 04/29/23   Gupta, Anjali L, MD  Saline 0.9 % AERS Place 1 spray into the nose daily at 2 am. Simply Saline    [provider]  vitamin C  (ASCORBIC ACID ) 500 MG tablet Take 500 mg by mouth daily.    [provider]      Allergies    Amoxicillin  and Zithromax  [azithromycin ]    Review of Systems   Review of Systems  Respiratory:  Positive for cough.   All other systems reviewed and are  negative.   Physical Exam Updated Vital Signs BP (!) 157/88   Pulse 91   Temp 99 F (37.2 C) (Oral)   Resp 20   SpO2 98%  Physical Exam Vitals and nursing note reviewed.  Constitutional:      General: She is not in acute distress.    Appearance: She is well-developed.  HENT:     Head: Normocephalic and atraumatic.     Mouth/Throat:     Comments: Mild posterior pharyngeal erythema.  Uvula midline rise symmetrical phonation.  No sublingual extremity lower swelling.  No trismus.  No change in phonation per patient.  Tonsils 1+ bilaterally without exudate. Eyes:     Conjunctiva/sclera: Conjunctivae normal.  Cardiovascular:     Rate and Rhythm: Normal rate and regular rhythm.  Pulmonary:     Effort: Pulmonary effort is normal. No respiratory distress.     Comments: Faint rales auscultated left lower lung field. Abdominal:     Palpations: Abdomen is soft.     Tenderness: There is no abdominal tenderness. There is no guarding.  Musculoskeletal:        General: No swelling.     Cervical back: Neck supple.  Skin:    General: Skin is warm and dry.     Capillary Refill: Capillary refill takes less than 2 seconds.  Neurological:     Mental Status: She is alert.     Comments: Alert and oriented to self, place, time and event.   Speech is fluent, clear without dysarthria or dysphasia.   Strength symmetric in upper/lower extremities   Sensation intact in upper/lower extremities   Ambulates independently without gross gate deviation CN I not tested  CN II not tested CN III, IV, VI PERRLA and EOMs intact bilaterally  CN V Intact sensation to sharp and light touch to the face  CN VII facial movements symmetric  CN VIII not tested  CN IX, X no uvula deviation, symmetric rise of soft palate  CN XI symmetric SCM and trapezius strength bilaterally  CN XII Midline tongue protrusion, symmetric L/R movements     Psychiatric:        Mood and Affect: Mood normal.     ED Results /  Procedures / Treatments   Labs (all labs ordered are listed, but only abnormal results are displayed) Labs Reviewed  RESP PANEL BY RT-PCR (RSV, FLU A&B, COVID)  [id number]    EKG None  Radiology DG Chest 2 View Result Date: 12/20/2023 CLINICAL DATA:  cough. EXAM: CHEST - 2 VIEW COMPARISON:  06/01/2022. FINDINGS: Bilateral lung fields are clear. Bilateral costophrenic angles are clear. Normal cardio-mediastinal silhouette. No acute osseous abnormalities. The soft tissues are within normal limits. IMPRESSION: *No active cardiopulmonary disease. Electronically Signed   By: Beula Brunswick M.D.   On: 12/20/2023 09:24    Procedures Procedures    Medications Ordered in ED Medications -  No data to display  ED Course/ Medical Decision Making/ A&P                                 Medical Decision Making Amount and/or Complexity of Data Reviewed Radiology: ordered.  Risk OTC drugs. Prescription drug management.   This patient presents to the ED for concern of cough, this involves an extensive number of treatment options, and is a complaint that carries with it a high risk of complications and morbidity.  The differential diagnosis includes COVID, flu, RSV, pneumonia, sepsis, COVID, asthma, other   Co morbidities that complicate the patient evaluation  See HPI   Additional history obtained:  Additional history obtained from EMR External records from outside source obtained and reviewed including hospital records   Lab Tests:  I Ordered, and personally interpreted labs.  The pertinent results include: Viral testing negative   Imaging Studies ordered:  I ordered imaging studies including chest x-ray I independently visualized and interpreted imaging which showed no acute cardiopulmonary abnormality I agree with the radiologist interpretation   Cardiac Monitoring: / EKG:  The patient was maintained on a cardiac monitor.  I personally viewed and interpreted the cardiac  monitored which showed an underlying rhythm of: Sinus rhythm   Consultations Obtained:  N/a   Problem List / ED Course / Critical interventions / Medication management  Cough, nasal congestion, headache Reevaluation of the patient showed that the patient stayed the same I have reviewed the patients home medicines and have made adjustments as needed   Social Determinants of Health:  Former cigarette use.  Denies illicit drug use.   Test / Admission - Considered:  Cough, nasal congestion, headache Vitals signs significant for hypertension blood pressure 157/88. Otherwise within normal range and stable throughout visit. Laboratory/imaging studies significant for: See above 78 year old female presents to the emergency department with complaints of cough, sore throat, congestion.  Patient states she has had symptoms for the past 4 to 5 days.  Multiple potential sick exposure in the outpatient setting.  Denies any fevers, chills, shortness of breath, chest pain, abdominal pain, nausea, vomiting.  Has not tried any medications for her symptoms.  Presents emergency department for further assessment/evaluation. On exam, faint auscultatory Rales left lower lung field.  Mild posterior pharyngeal erythema but without clinical evidence of Ludwig angina, PTA, Lemierre's disease, retropharyngeal abscess.  Patient with nonfocal neurologic exam with no clinical evidence of meningismus.  Viral testing was negative.  Chest x-ray without obvious acute cardiopulmonary abnormality.  Given clinical concern for Rales in the setting of respiratory illness 4 to 5 days, will treat empirically for CAP as well as recommend further symptomatic therapy as described in AVS.  Will recommend close follow-up with PCP in the outpatient setting.  Treatment plan discussed with patient and she acknowledged understanding was agreeable to said plan.  Patient overall well-appearing, afebrile in no acute distress. Worrisome signs  and symptoms were discussed with the patient, and the patient acknowledged understanding to return to the ED if noticed. Patient was stable upon discharge.          Final Clinical Impression(s) / ED Diagnoses Final diagnoses:  Acute cough  Nasal congestion  Acute nonintractable headache, unspecified headache type    Rx / DC Orders ED Discharge Orders     None         [place] Butter, [place] 12/20/23 1007    Guadalupe Lee, MD  12/21/23 0744  

## 2023-12-20 NOTE — ED Triage Notes (Signed)
 Pt c/o HA, cough, congestion and sore throat x 4  Days. Eval for same at [facility]

## 2023-12-20 NOTE — Discharge Instructions (Addendum)
 You tested negative for COVID, flu, RSV.  Chest x-ray did not show obvious pneumonia but given the crackles heard in your left lower lung, we will place you on antibiotics empirically for treatment of pneumonia.  Will also send in cough medicine as well as nasal steroid spray to help with your symptoms.  You may take Tylenol  for fever, headache, body aches.  Recommend close follow-up with your PCP for reevaluation.  Please do not hesitate to return if the worrisome signs and symptoms we discussed become apparent.

## 2023-12-20 NOTE — ED Notes (Signed)
 Patient transported to X-ray

## 2023-12-22 NOTE — Telephone Encounter (Signed)
 Error

## 2023-12-23 DIAGNOSIS — L600 Ingrowing nail: Secondary | ICD-10-CM | POA: Diagnosis not present

## 2024-01-10 ENCOUNTER — Encounter: Payer: Self-pay | Admitting: Internal Medicine

## 2024-01-11 LAB — BASIC METABOLIC PANEL WITH GFR
BUN: 16 (ref 4–21)
CO2: 22 (ref 13–22)
Chloride: 100 (ref 99–108)
Creatinine: 0.6 (ref 0.5–1.1)
Glucose: 153
Potassium: 4.4 meq/L (ref 3.5–5.1)
Sodium: 137 (ref 137–147)

## 2024-01-11 LAB — CBC AND DIFFERENTIAL
HCT: 97 — AB (ref 36–46)
Hemoglobin: 12.6 (ref 12.0–16.0)
Platelets: 216 10*3/uL (ref 150–400)
WBC: 6.9

## 2024-01-11 LAB — COMPREHENSIVE METABOLIC PANEL WITH GFR
Albumin: 4.3 (ref 3.5–5.0)
Calcium: 9.5 (ref 8.7–10.7)
Globulin: 2.9
eGFR: >90

## 2024-01-11 LAB — LIPID PANEL
Cholesterol: 142 (ref 0–200)
HDL: 51 (ref 35–70)
LDL Cholesterol: 70
LDl/HDL Ratio: 2.8
Triglycerides: 105 (ref 40–160)

## 2024-01-11 LAB — HEPATIC FUNCTION PANEL
ALT: 53 U/L — AB (ref 7–35)
AST: 51 — AB (ref 13–35)
Alkaline Phosphatase: 84 (ref 25–125)
Bilirubin, Total: 0.4

## 2024-01-11 LAB — CBC: RBC: 3.85 — AB (ref 3.87–5.11)

## 2024-01-11 LAB — HEMOGLOBIN A1C: Hemoglobin A1C: 6.9

## 2024-01-12 ENCOUNTER — Encounter: Payer: Self-pay | Admitting: Internal Medicine

## 2024-01-12 ENCOUNTER — Ambulatory Visit: Payer: Self-pay | Admitting: *Deleted

## 2024-01-12 ENCOUNTER — Ambulatory Visit (INDEPENDENT_AMBULATORY_CARE_PROVIDER_SITE_OTHER): Admitting: Family

## 2024-01-12 VITALS — BP 122/76 | HR 78 | Temp 97.5°F | Resp 18 | Ht 67.5 in | Wt 183.8 lb

## 2024-01-12 DIAGNOSIS — R059 Cough, unspecified: Secondary | ICD-10-CM | POA: Diagnosis not present

## 2024-01-12 DIAGNOSIS — J302 Other seasonal allergic rhinitis: Secondary | ICD-10-CM

## 2024-01-12 DIAGNOSIS — R0982 Postnasal drip: Secondary | ICD-10-CM

## 2024-01-12 LAB — POCT INFLUENZA A/B
Influenza A, POC: NEGATIVE
Influenza B, POC: NEGATIVE

## 2024-01-12 LAB — POC COVID19 BINAXNOW: SARS Coronavirus 2 Ag: NEGATIVE

## 2024-01-12 MED ORDER — LORATADINE 10 MG PO TABS: 10.0000 mg | ORAL_TABLET | Freq: Every day | ORAL | 11 refills | Status: DC

## 2024-01-12 MED ORDER — TRIAMCINOLONE ACETONIDE 55 MCG/ACT NA AERO: 2.0000 | INHALATION_SPRAY | Freq: Every day | NASAL | 0 refills | Status: DC

## 2024-01-12 NOTE — Telephone Encounter (Signed)
  Chief Complaint: cough Symptoms: cough, chest/sinus congestion, sore throat Frequency: 1 week Pertinent Negatives: Patient denies SOB Disposition: [] ED /[] Urgent Care (no appt availability in office) / [x] Appointment(In office/virtual)/ []  [place] [facility]/ [] Home Care/ [] Refused Recommended Disposition /[] [place] [facility]/ []  Follow-up with PCP Additional Notes: Patient wanted appointment at [facility]- but none found- so scheduled at office.   Copied from CRM [id number]. Topic: Clinical - Red Word Triage >> Jan 12, 2024  8:06 AM Tisa Forester wrote: Red Word that prompted transfer to Nurse Triage: bad cough getting worse, chest congestion , colored yellow phelm post nasal drip Reason for Disposition  SEVERE coughing spells (e.g., whooping sound after coughing, vomiting after coughing)  Answer Assessment - Initial Assessment Questions 1. ONSET: "When did the cough begin?"      Started 3 weeks ago- was seen and given antibiotic- got better and 1 week ago restarted 2. SEVERITY: "How bad is the cough today?"      Cough fits that are hard to stop 3. SPUTUM: "Describe the color of your sputum" (none, dry cough; clear, white, yellow, green)     yellow 4. HEMOPTYSIS: "Are you coughing up any blood?" If so ask: "How much?" (flecks, streaks, tablespoons, etc.)     no 5. DIFFICULTY BREATHING: "Are you having difficulty breathing?" If Yes, ask: "How bad is it?" (e.g., mild, moderate, severe)    - MILD: No SOB at rest, mild SOB with walking, speaks normally in sentences, can lie down, no retractions, pulse < 100.    - MODERATE: SOB at rest, SOB with minimal exertion and prefers to sit, cannot lie down flat, speaks in phrases, mild retractions, audible wheezing, pulse 100-120.    - SEVERE: Very SOB at rest, speaks in single words, struggling to breathe, sitting hunched forward, retractions, pulse > 120      Ok- patient is able to walk and do normal activity 6. FEVER: "Do you have a fever?" If  Yes, ask: "What is your temperature, how was it measured, and when did it start?"     no 7. CARDIAC HISTORY: "Do you have any history of heart disease?" (e.g., heart attack, congestive heart failure)      Rapid heart rate, BP 8. LUNG HISTORY: "Do you have any history of lung disease?"  (e.g., pulmonary embolus, asthma, emphysema)     Hx pneumonia  9. PE RISK FACTORS: "Do you have a history of blood clots?" (or: recent major surgery, recent prolonged travel, bedridden)     no 10. OTHER SYMPTOMS: "Do you have any other symptoms?" (e.g., runny nose, wheezing, chest pain)       Chest congestion, sinus congestion, sore throat  Protocols used: Cough - Acute Productive-A-AH

## 2024-01-12 NOTE — Telephone Encounter (Signed)
 Noted

## 2024-01-12 NOTE — Progress Notes (Signed)
 Provider: Bunny Kleist FNP-C  Marguerite Shiley, MD  Patient Care Team: Marguerite Shiley, MD as PCP - General (Internal Medicine) Tobb, Kardie, DO as PCP - Cardiology (Cardiology) Burton, Lacie K, NP as Nurse Practitioner (Hematology and Oncology)  Extended Emergency Contact Information Primary Emergency Contact: Davison,Jean Address: [address], [place] [address] [place] Phone: [phone] Relation: Sister  Code Status:  Full Code  Goals of care: Advanced Directive information    01/12/2024    2:31 PM  Advanced Directives  Does Patient Have a Medical Advance Directive? Yes  Type of Estate agent of [place];Living will  Does patient want to make changes to medical advance directive? No - Patient declined  Copy of Healthcare Power of Attorney in Chart? Yes - validated most recent copy scanned in chart (See row information)     Chief Complaint  Patient presents with   Acute Visit    Cough    Discussed the use of AI scribe software for clinical note transcription with the patient, who gave verbal consent to proceed.  History of Present Illness   Victoria Wolfe is a 78 year old female who presents with a persistent cough and postnasal drip.  She has been experiencing a persistent cough for the past three weeks. Initially, she visited the ER at [facility] where a chest x-ray was performed and found to be clear. Despite this, she was treated for pneumonia at [facility], which led to improvement. However, the cough returned last week, accompanied by severe sinus pressure and pain, which later subsided, but the cough persisted.  The cough is described as uncontrollable and sometimes so severe that it nearly induces vomiting or causes her head to pulse. It is sometimes productive, with yellow postnasal drip. She has to sleep sitting up. No fever.  She uses Simply Saline nasal spray every morning and was previously given  Nasacort  for allergies at the ER, which she used for a while. She has not been using any antihistamines like Claritin or Zyrtec due to concerns about interactions with her high blood pressure medications. She drinks plenty of water and hot liquids like tea to help manage her symptoms.  She mentions using Vicks VapoRub and a heated towel, which provided some relief, allowing her to sleep for four hours straight.      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   Arthritis    Per [facility] New Patient Packet    B12 deficiency 03/20/2020   Back pain 09/28/2022   Bilateral malignant neoplasm of breast in female [facility]) 10/18/2018   Breast cancer [facility])    Per [facility] New Patient Packet    Cancer of overlapping sites of left breast (HCC) 10/18/2018   Cataract    Cough 10/04/2023   Diabetes mellitus without complication (HCC)    Diverticulosis 05/10/2019   Elevated LFTs 09/15/2021   Essential hypertension 10/25/2015   Former smoker 05/18/2023   GERD (gastroesophageal reflux disease)    Hepatic steatosis 05/18/2023   High blood pressure    Per [facility] New Patient Packet    High cholesterol    Per [facility] New Patient Packet    History of colon polyps 05/10/2019   Hyperlipidemia LDL goal <100 10/25/2015   Hyperlipidemia LDL goal <130 10/25/2015   Osteopenia 10/07/2022   Peripheral arterial disease (HCC)    Pneumonia    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   Shingles    Type 2 diabetes mellitus without complication, without long-term current use of insulin (HCC) 09/15/2021   Vitamin D  deficiency 09/15/2021   Past Surgical History:  Procedure Laterality Date   ABDOMINAL AORTOGRAM W/LOWER EXTREMITY N/A 06/28/2023   Procedure: ABDOMINAL AORTOGRAM W/LOWER EXTREMITY;  Surgeon: Adine Hoof, MD;  Location: [facility] INVASIVE CV LAB;  Service: Cardiovascular;  Laterality: N/A;   CATARACT  EXTRACTION  2018   Per [facility] New Patient Packet, Dr.Todd Leventhal    CESAREAN SECTION     x2   COLONOSCOPY  2018   Per [facility] new patient packet, Dr.Locker   ILIAC ARTERY STENT N/A    2   MASTECTOMY Bilateral 02/02/2019   Per [facility] New Patient Packet, DrMichele Blackwood and Dr.Colon    MENISCECTOMY Right 2009   MENISCUS REPAIR  2004   Per [facility] New Patient Packet, Dr. Solomon Dupre   TOENAIL TRIMMING Left    ingrown in big toe   TONSILLECTOMY  1951   Per [facility] New Patient Packet    Allergies  Allergen Reactions   Amoxicillin  Rash    Had rash after taking amoxil  and zithromax , not sure which one caused the rash   Zithromax  [Azithromycin ] Rash    Had rash after taking zithromax  and amoxil  at the same time, not sure which antibiotic caused the rash    Outpatient Encounter Medications as of 01/12/2024  Medication Sig   amLODipine  (NORVASC ) 5 MG tablet Take 1 tablet (5 mg total) by mouth daily.   aspirin  EC (ASPIRIN  ADULT LOW DOSE) 81 MG tablet Take 81 mg by mouth once.   benzonatate  (TESSALON ) 100 MG capsule Take 1 capsule (100 mg total) by mouth 3 (three) times daily as needed.   calcium  carbonate (OSCAL) 1500 (600 Ca) MG TABS tablet Take 2 tablets (3,000 mg total) by mouth daily with breakfast.   candesartan -hydrochlorothiazide (ATACAND  HCT) 32-12.5 MG tablet TAKE 1 TABLET BY MOUTH EVERY DAY   cholecalciferol (VITAMIN D3) 25 MCG (1000 UNIT) tablet Take 1,000 Units by mouth daily.   clopidogrel  (PLAVIX ) 75 MG tablet Take 1 tablet (75 mg total) by mouth daily.   Cyanocobalamin (VITAMIN B-12) 1000 MCG SUBL Take 1,000 mcg by mouth daily.   esomeprazole (NEXIUM) 20 MG capsule Take 20 mg by mouth daily at 12 noon.   ezetimibe  (ZETIA ) 10 MG tablet Take 1 tablet (10 mg total) by mouth daily.   gabapentin  (NEURONTIN ) 100 MG capsule Take 1 capsule (100 mg total) by mouth at bedtime for 7 days, THEN 2 capsules (200 mg total) at bedtime for 7 days, THEN 3 capsules (300 mg total) at bedtime.   loratadine  (CLARITIN) 10 MG tablet Take 1 tablet (10 mg total) by mouth daily.   metFORMIN  (GLUCOPHAGE ) 500 MG tablet Take 1 tablet (500 mg total) by mouth 2 (two) times daily with a meal.   metoprolol  succinate (TOPROL  XL) 25 MG 24 hr tablet Take 0.5 tablets (12.5 mg total) by mouth daily.   nystatin  (MYCOSTATIN /NYSTOP ) powder Apply 1 Application topically 3 (three) times daily.   Polyethyl Glycol-Propyl Glycol (SYSTANE) 0.4-0.3 % SOLN Place 1 drop into both eyes every morning.   rosuvastatin  (CRESTOR ) 20 MG tablet TAKE 1 TABLET BY MOUTH EVERY DAY   Saline 0.9 % AERS Place 1 spray into the nose daily at 2 am. Simply Saline   vitamin C  (ASCORBIC ACID ) 500 MG tablet Take 500 mg by mouth daily.   cefdinir  (OMNICEF ) 300 MG capsule Take  1 capsule (300 mg total) by mouth 2 (two) times daily. (Patient not taking: Reported on 01/12/2024)   doxycycline  (VIBRAMYCIN ) 100 MG capsule Take 1 capsule (100 mg total) by mouth 2 (two) times daily. (Patient not taking: Reported on 01/12/2024)   psyllium (METAMUCIL) 58.6 % packet Take 1 packet by mouth daily. (Patient not taking: Reported on 01/12/2024)   triamcinolone  (NASACORT ) 55 MCG/ACT AERO nasal inhaler Place 2 sprays into the nose daily.   [DISCONTINUED] triamcinolone  (NASACORT ) 55 MCG/ACT AERO nasal inhaler Place 2 sprays into the nose daily. (Patient not taking: Reported on 01/12/2024)   No facility-administered encounter medications on file as of 01/12/2024.    Review of Systems  Constitutional:  Negative for appetite change, chills, fatigue, fever and unexpected weight change.  HENT:  Positive for postnasal drip. Negative for congestion, dental problem, ear discharge, ear pain, facial swelling, hearing loss, nosebleeds, rhinorrhea, sinus pressure, sinus pain, sneezing, sore throat, tinnitus and trouble swallowing.   Eyes:  Negative for pain, discharge, redness, itching and visual disturbance.  Respiratory:  Positive for cough. Negative for chest tightness, shortness of  breath and wheezing.   Cardiovascular:  Negative for chest pain, palpitations and leg swelling.  Gastrointestinal:  Negative for abdominal distention, abdominal pain, constipation, diarrhea, nausea and vomiting.  Musculoskeletal:  Negative for arthralgias, back pain, gait problem, joint swelling and myalgias.  Skin:  Negative for color change, pallor and rash.  Neurological:  Negative for dizziness, weakness, light-headedness and headaches.    Immunization History  Administered Date(s) Administered   Fluad Quad(high Dose 65+) 06/01/2022   Fluad Trivalent(High Dose 65+) 05/27/2023   Hepb-cpg 03/11/2022, 04/11/2022   Influenza Split 06/24/2014   Influenza, High Dose Seasonal PF 05/29/2013, 05/22/2021   Influenza,inj,Quad PF,6+ Mos 06/10/2015, 05/01/2016, 05/10/2017, 06/01/2018   Influenza-Unspecified 05/24/2013, 06/24/2014, 06/10/2015, 05/01/2016, 06/01/2018, 05/18/2019, 05/25/2020   Moderna Covid-19 Fall Seasonal Vaccine 108yrs & older 06/18/2022, 05/27/2023   Moderna Covid-19 Vaccine Bivalent Booster 40yrs & up 05/15/2021   Moderna Sars-Covid-2 Vaccination 09/10/2019, 10/03/2019, 06/25/2020, 11/28/2020   Pneumococcal Conjugate-13 08/25/2015, 10/25/2015   Pneumococcal Polysaccharide-23 03/06/2013   Tdap 08/27/2014   Zoster Recombinant(Shingrix) 11/16/2017, 03/22/2018   Zoster, Live 11/03/2010   Pertinent  Health Maintenance Due  Topic Date Due   OPHTHALMOLOGY EXAM  10/23/2023   HEMOGLOBIN A1C  03/13/2024   INFLUENZA VACCINE  03/24/2024   FOOT EXAM  09/19/2024   DEXA SCAN  Completed   Colonoscopy  Discontinued      05/10/2023    1:13 PM 05/18/2023    1:59 PM 09/20/2023    1:41 PM 11/19/2023    8:26 AM 01/12/2024    2:30 PM  Fall Risk  Falls in the past year? 0 0 0 0 0  Was there an injury with Fall? 0 0 0 0 0  Fall Risk Category Calculator 0 0 0 0 0  Patient at Risk for Falls Due to No Fall Risks No Fall Risks No Fall Risks No Fall Risks No Fall Risks  Fall risk Follow up Falls  evaluation completed Falls evaluation completed Falls evaluation completed Falls evaluation completed Falls evaluation completed   Functional Status Survey:    Vitals:   01/12/24 1436  BP: 122/76  Pulse: 78  Resp: 18  Temp: (!) 97.5 F (36.4 C)  SpO2: 97%  Weight: 183 lb 12.8 oz (83.4 kg)  Height: 5' 7.5" (1.715 m)   Body mass index is 28.36 kg/m. Physical Exam  GENERAL: Alert, cooperative, well developed, no acute distress. HEENT: Normocephalic,  normal oropharynx, moist mucous membranes, ears normal bilaterally, nose without swelling, postnasal drip present, mild sinus tenderness. CHEST: Clear to auscultation bilaterally, no wheezes, rhonchi, or crackles. CARDIOVASCULAR: Normal heart rate and rhythm, S1 and S2 normal without murmurs. ABDOMEN: Soft, non-tender, non-distended, without organomegaly, normal bowel sounds. EXTREMITIES: No cyanosis or edema, no leg swelling. NEUROLOGICAL: Cranial nerves grossly intact, moves all extremities without gross motor or sensory deficit.      Labs reviewed: Recent Labs    05/30/23 0923 06/28/23 0606  NA 138 143  K 3.5 3.8  CL 103 105  CO2 25  --   GLUCOSE 154* 139*  BUN 15 17  CREATININE 0.60 0.60  CALCIUM  9.4  --    Recent Labs    05/04/23 0000 05/30/23 0923 09/14/23 0000  AST 55* 23 36*  ALT 65* 31 48*  ALKPHOS 94 61 81  BILITOT  --  0.6  --   PROT  --  7.0  --   ALBUMIN 4.5 4.2 4.5   Recent Labs    05/30/23 0923 06/28/23 0606  WBC 9.2  --   NEUTROABS 6.8  --   HGB 12.7 11.6*  HCT 36.7 34.0*  MCV 93.1  --   PLT 226  --    Lab Results  Component Value Date   TSH 1.43 11/16/2017   Lab Results  Component Value Date   HGBA1C 6.4 09/14/2023   Lab Results  Component Value Date   CHOL 173 09/14/2023   HDL 59 09/14/2023   LDLCALC 95 09/14/2023   TRIG 95 09/14/2023    Significant Diagnostic Results in last 30 days:  DG Chest 2 View Result Date: 12/20/2023 CLINICAL DATA:  cough. EXAM: CHEST - 2 VIEW  COMPARISON:  06/01/2022. FINDINGS: Bilateral lung fields are clear. Bilateral costophrenic angles are clear. Normal cardio-mediastinal silhouette. No acute osseous abnormalities. The soft tissues are within normal limits. IMPRESSION: *No active cardiopulmonary disease. Electronically Signed   By: Beula Brunswick M.D.   On: 12/20/2023 09:24    Assessment/Plan  Postnasal drip with cough Postnasal drip causing an uncontrollable cough, sometimes productive with yellow sputum, exacerbated by lying flat. No fever present. Clear lung examination indicates no pneumonia. Likely due to postnasal drip from seasonal allergies. Concern about medication interactions with antihypertensives. - Recommend Claritin 10 mg to dry up postnasal drip. - Advise Nasacort  nasal spray to reduce nasal drainage. - Instruct to report worsening symptoms or development of wheezing.  Seasonal allergies Seasonal allergies contributing to postnasal drip and cough. Symptoms include sinus pressure and pain, improved but persistent cough. Likely exacerbated by pollen exposure. Using Simply Saline and Vicks VapoRub for relief. - Recommend Claritin 10 mg to manage allergy symptoms. - Advise Nasacort  nasal spray to reduce nasal drainage.  Pneumonia Previous pneumonia, but current symptoms and clear lung examination indicate no recurrence. Reassured that symptoms are not indicative of pneumonia.   Family/ staff Communication: Reviewed plan of care with patient verbalized understanding   Labs/tests ordered: None   Next Appointment: Return if symptoms worsen or fail to improve.   Total time: minutes. Greater than 50% of total time spent doing patient education regarding cough,seasonal allergies,PND and health maintenance including symptom/medication management.   Estil Heman, NP

## 2024-01-18 ENCOUNTER — Non-Acute Institutional Stay: Payer: BLUE CROSS/BLUE SHIELD | Admitting: Internal Medicine

## 2024-01-18 VITALS — BP 146/90 | HR 73 | Temp 98.1°F | Resp 20 | Ht 67.5 in | Wt 185.6 lb

## 2024-01-18 DIAGNOSIS — R0982 Postnasal drip: Secondary | ICD-10-CM | POA: Diagnosis not present

## 2024-01-18 DIAGNOSIS — H9313 Tinnitus, bilateral: Secondary | ICD-10-CM

## 2024-01-18 DIAGNOSIS — R7989 Other specified abnormal findings of blood chemistry: Secondary | ICD-10-CM

## 2024-01-18 DIAGNOSIS — R059 Cough, unspecified: Secondary | ICD-10-CM | POA: Diagnosis not present

## 2024-01-18 DIAGNOSIS — I739 Peripheral vascular disease, unspecified: Secondary | ICD-10-CM

## 2024-01-18 DIAGNOSIS — R002 Palpitations: Secondary | ICD-10-CM

## 2024-01-18 DIAGNOSIS — I10 Essential (primary) hypertension: Secondary | ICD-10-CM

## 2024-01-18 DIAGNOSIS — E785 Hyperlipidemia, unspecified: Secondary | ICD-10-CM

## 2024-01-18 DIAGNOSIS — E1159 Type 2 diabetes mellitus with other circulatory complications: Secondary | ICD-10-CM

## 2024-01-18 NOTE — Progress Notes (Signed)
 Location:  [facility] features editor of Service:  Clinic (12)  Provider:   Code Status:  Goals of Care:     01/12/2024    2:31 PM  Advanced Directives  Does Patient Have a Medical Advance Directive? Yes  Type of Estate agent of [place];Living will  Does patient want to make changes to medical advance directive? No - Patient declined  Copy of Healthcare Power of Attorney in Chart? Yes - validated most recent copy scanned in chart (See row information)     Chief Complaint  Patient presents with   Follow-up    HPI: Patient is a 78 y.o. female seen today for medical management of chronic diseases.   [place] Hypertension, prediabetes and diet-controlled, HLD, B12 deficiency   Elevated A1C Doing well on Metformin  Elevated LFTs.  Ultrasound showed NASH  Per GI Max control of Diabetes Avoid Alcohol  Q 6 months LFTS to see the tren Bilateral mastectomy for breast cancer Not on tamoxifen  any more due to side effects Follows with Dr Grayland Le PAD  s/p left common iliac artery stent 12/24  Claudication has now stopped On aspirin  Plavix  and statin Palpitations Seen Cardiology Zio monitor showed Atrial tachycardia Echo normal  Discussed the use of AI scribe software for clinical note transcription with the patient, who gave verbal consent to proceed.  History of Present Illness   ALT and AST levels are elevated, possibly due to increased wine consumption. She rarely uses acetaminophen , only for headaches related to congestion.  Congestion in the head and chest has persisted for a month, causing severe headaches. Two weeks ago, she was prescribed antibiotics in the ER, which initially helped, but symptoms returned. NasalCort and Claritin  have improved her cough. She no longer coughs at night but still has postnasal drip and a morning cough with sputum. A chest X-ray was clear.  Heart palpitations, hot flashes, and tinnitus occurred  primarily at night. Metoprolol  has significantly improved these symptoms. She monitors her heart rate and blood pressure with a watch, noting discrepancies with clinic readings. Gabapentin  has improved hot flashes, but tinnitus persists. No dizziness, but potential hearing loss is noted.  Back pain due to arthritis and a protruding disc has significantly improved with daily exercises. Her A1c is stable, and she is pleased with her current health status.         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   Arthritis    Per [facility] New Patient Packet    B12 deficiency 03/20/2020   Back pain 09/28/2022   Bilateral malignant neoplasm of breast in female [facility]) 10/18/2018   Breast cancer [facility])    Per [facility] New Patient Packet    Cancer of overlapping sites of left breast (HCC) 10/18/2018   Cataract    Cough 10/04/2023   Diabetes mellitus without complication (HCC)    Diverticulosis 05/10/2019   Elevated LFTs 09/15/2021   Essential hypertension 10/25/2015   Former smoker 05/18/2023   GERD (gastroesophageal reflux disease)    Hepatic steatosis 05/18/2023   High blood pressure    Per [facility] New Patient Packet    High cholesterol    Per [facility] New Patient Packet    History of colon polyps 05/10/2019   Hyperlipidemia LDL goal <100 10/25/2015   Hyperlipidemia LDL goal <130 10/25/2015   Osteopenia 10/07/2022   Peripheral arterial disease (HCC)    Pneumonia    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/10/2019   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   Shingles    Type 2 diabetes mellitus without complication, without long-term current use of insulin (HCC) 09/15/2021   Vitamin D  deficiency 09/15/2021    Past Surgical History:  Procedure Laterality Date   ABDOMINAL AORTOGRAM W/LOWER EXTREMITY N/A 06/28/2023   Procedure: ABDOMINAL AORTOGRAM W/LOWER EXTREMITY;  Surgeon: Adine Hoof, MD;  Location: [facility] INVASIVE CV LAB;  Service:  Cardiovascular;  Laterality: N/A;   CATARACT EXTRACTION  2018   Per [facility] New Patient Packet, Dr.Todd Leventhal    CESAREAN SECTION     x2   COLONOSCOPY  2018   Per [facility] new patient packet, Dr.Locker   ILIAC ARTERY STENT N/A    2   MASTECTOMY Bilateral 02/02/2019   Per [facility] New Patient Packet, DrMichele Blackwood and Dr.Colon    MENISCECTOMY Right 2009   MENISCUS REPAIR  2004   Per [facility] New Patient Packet, Dr. Solomon Dupre   TOENAIL TRIMMING Left    ingrown in big toe   TONSILLECTOMY  1951   Per [facility] New Patient Packet    Allergies  Allergen Reactions   Amoxicillin  Rash    Had rash after taking amoxil  and zithromax , not sure which one caused the rash   Zithromax  [Azithromycin ] Rash    Had rash after taking zithromax  and amoxil  at the same time, not sure which antibiotic caused the rash    Outpatient Encounter Medications as of 01/18/2024  Medication Sig   amLODipine  (NORVASC ) 5 MG tablet Take 1 tablet (5 mg total) by mouth daily.   aspirin  EC (ASPIRIN  ADULT LOW DOSE) 81 MG tablet Take 81 mg by mouth once.   calcium  carbonate (OSCAL) 1500 (600 Ca) MG TABS tablet Take 2 tablets (3,000 mg total) by mouth daily with breakfast.   candesartan -hydrochlorothiazide (ATACAND  HCT) 32-12.5 MG tablet TAKE 1 TABLET BY MOUTH EVERY DAY   cholecalciferol (VITAMIN D3) 25 MCG (1000 UNIT) tablet Take 1,000 Units by mouth daily.   clopidogrel  (PLAVIX ) 75 MG tablet Take 1 tablet (75 mg total) by mouth daily.   Cyanocobalamin (VITAMIN B-12) 1000 MCG SUBL Take 1,000 mcg by mouth daily.   esomeprazole (NEXIUM) 20 MG capsule Take 20 mg by mouth daily at 12 noon.   ezetimibe  (ZETIA ) 10 MG tablet Take 1 tablet (10 mg total) by mouth daily.   gabapentin  (NEURONTIN ) 100 MG capsule Take 1 capsule (100 mg total) by mouth at bedtime for 7 days, THEN 2 capsules (200 mg total) at bedtime for 7 days, THEN 3 capsules (300 mg total) at bedtime.   loratadine  (CLARITIN ) 10 MG tablet Take 1 tablet (10 mg total) by mouth daily.    metFORMIN  (GLUCOPHAGE ) 500 MG tablet Take 1 tablet (500 mg total) by mouth 2 (two) times daily with a meal.   metoprolol  succinate (TOPROL  XL) 25 MG 24 hr tablet Take 0.5 tablets (12.5 mg total) by mouth daily.   nystatin  (MYCOSTATIN /NYSTOP ) powder Apply 1 Application topically 3 (three) times daily.   Polyethyl Glycol-Propyl Glycol (SYSTANE) 0.4-0.3 % SOLN Place 1 drop into both eyes every morning.   rosuvastatin  (CRESTOR ) 20 MG tablet TAKE 1 TABLET BY MOUTH EVERY DAY   Saline 0.9 % AERS Place 1 spray into the nose daily at 2 am. Simply Saline   triamcinolone  (NASACORT ) 55 MCG/ACT AERO nasal inhaler Place 2 sprays into the nose daily.   vitamin C  (ASCORBIC ACID ) 500 MG tablet Take 500 mg by mouth daily.   benzonatate  (TESSALON ) 100 MG capsule Take 1  capsule (100 mg total) by mouth 3 (three) times daily as needed. (Patient not taking: Reported on 01/18/2024)   No facility-administered encounter medications on file as of 01/18/2024.    Review of Systems:  Review of Systems  Constitutional:  Negative for activity change and appetite change.  HENT:  Positive for postnasal drip, rhinorrhea and tinnitus.   Respiratory:  Negative for cough and shortness of breath.   Cardiovascular:  Negative for leg swelling.  Gastrointestinal:  Negative for constipation.  Genitourinary: Negative.   Musculoskeletal:  Negative for arthralgias, gait problem and myalgias.  Skin: Negative.   Neurological:  Negative for dizziness and weakness.  Psychiatric/Behavioral:  Negative for confusion, dysphoric mood and sleep disturbance.     Health Maintenance  Topic Date Due   Diabetic kidney evaluation - Urine ACR  Never done   OPHTHALMOLOGY EXAM  10/23/2023   COVID-19 Vaccine (8 - Moderna risk 2024-25 season) 11/25/2023   INFLUENZA VACCINE  03/24/2024   Medicare Annual Wellness (AWV)  05/09/2024   HEMOGLOBIN A1C  07/13/2024   DTaP/Tdap/Td (2 - Td or Tdap) 08/27/2024   FOOT EXAM  09/19/2024   Diabetic kidney  evaluation - eGFR measurement  01/10/2025   Pneumonia Vaccine 66+ Years old  Completed   DEXA SCAN  Completed   Hepatitis C Screening  Completed   Zoster Vaccines- Shingrix  Completed   HPV VACCINES  Aged Out   Meningococcal B Vaccine  Aged Out   Colonoscopy  Discontinued    Physical Exam: Vitals:   01/18/24 1113  BP: (!) 146/90  Pulse: 73  Resp: 20  Temp: 98.1 F (36.7 C)  SpO2: 98%  Weight: 185 lb 9.6 oz (84.2 kg)  Height: 5' 7.5" (1.715 m)   Body mass index is 28.64 kg/m. Physical Exam  Labs reviewed: Basic Metabolic Panel: Recent Labs    05/30/23 0923 06/28/23 0606 01/11/24 0000 01/11/24 1549  NA 138 143 137  --   K 3.5 3.8 4.4  --   CL 103 105 100  --   CO2 25  --  22  --   GLUCOSE 154* 139*  --   --   BUN 15 17 16   --   CREATININE 0.60 0.60 0.6  --   CALCIUM  9.4  --   --  9.5   Liver Function Tests: Recent Labs    05/30/23 0923 09/14/23 0000 01/11/24 0000 01/11/24 1549  AST 23 36* 51*  --   ALT 31 48* 53*  --   ALKPHOS 61 81 84  --   BILITOT 0.6  --   --   --   PROT 7.0  --   --   --   ALBUMIN 4.2 4.5  --  4.3   No results for input(s): "LIPASE", "AMYLASE" in the last 8760 hours. No results for input(s): "AMMONIA" in the last 8760 hours. CBC: Recent Labs    05/30/23 0923 06/28/23 0606 01/11/24 0000  WBC 9.2  --  6.9  NEUTROABS 6.8  --   --   HGB 12.7 11.6* 12.6  HCT 36.7 34.0* 97*  MCV 93.1  --   --   PLT 226  --  216   Lipid Panel: Recent Labs    09/14/23 0000  CHOL 173  HDL 59  LDLCALC 95  TRIG 95   Lab Results  Component Value Date   HGBA1C 6.9 01/11/2024    Procedures since last visit: DG Chest 2 View Result Date: 12/20/2023 CLINICAL DATA:  cough. EXAM: CHEST - 2 VIEW COMPARISON:  06/01/2022. FINDINGS: Bilateral lung fields are clear. Bilateral costophrenic angles are clear. Normal cardio-mediastinal silhouette. No acute osseous abnormalities. The soft tissues are within normal limits. IMPRESSION: *No active  cardiopulmonary disease. Electronically Signed   By: Beula Brunswick M.D.   On: 12/20/2023 09:24    Assessment/Plan 1. Tinnitus of both ears (Primary) New oNset in past 6 months Bilateral tinnitus without dizziness. ENT referral for further evaluation recommended. - Refer to ENT for evaluation of tinnitus.  2. Cough in adult Better on Nasocort  3. Hot flashes Gabapentin  effectively reduces intensity and duration of hot flashes. - Continue gabapentin  as prescribed by OB GYN.       4. Palpitations Zio Patch showed Atrial Tachycardia/SVT Now on Metoprolol  low dose and feels better  5. Essential hypertension Atacand  and Metoprolol  and nNorvasc  6. Hyperlipidemia LDL goal <100 On statin Zetia  added for better control  7. Type 2 diabetes mellitus with other circulatory complication, without long-term current use of insulin (HCC) A1C is good  8. Elevated LFTs h/o NASH Mild liver enzyme elevation likely due to alcohol and acetaminophen  use. Monitoring required. - Advise reduction in alcohol consumption.  9. Peripheral arterial disease (HCC) S/p Stent On Aspirin  and Plavix  and statin  10 Arthritis and protruding disc Back pain improved with exercise. Minimal current pain. - Continue daily exercises for back health.     Labs/tests ordered:  Labs before next visit Next appt:  Visit date not found

## 2024-02-03 ENCOUNTER — Other Ambulatory Visit: Payer: Self-pay | Admitting: Obstetrics and Gynecology

## 2024-02-03 DIAGNOSIS — N951 Menopausal and female climacteric states: Secondary | ICD-10-CM

## 2024-02-03 NOTE — Telephone Encounter (Signed)
 Med refill request: Gabapentin   Last AEX: 11/19/23 Next AEX: 11/20/24 Last MMG (if hormonal med)  Refill authorized: last rx 11/19/23 #[id number] (90 day supply) with 0 refills. Please approve or deny

## 2024-02-09 ENCOUNTER — Ambulatory Visit (INDEPENDENT_AMBULATORY_CARE_PROVIDER_SITE_OTHER): Payer: BLUE CROSS/BLUE SHIELD | Admitting: Physician Assistant

## 2024-02-09 ENCOUNTER — Ambulatory Visit (HOSPITAL_COMMUNITY)
Admission: RE | Admit: 2024-02-09 | Discharge: 2024-02-09 | Disposition: A | Discharge status: Home / Self Care | Payer: BLUE CROSS/BLUE SHIELD | Source: Ambulatory Visit | Attending: Vascular Surgery | Admitting: Vascular Surgery

## 2024-02-09 VITALS — BP 126/73 | HR 77 | Temp 98.1°F | Ht 67.5 in | Wt 184.8 lb

## 2024-02-09 DIAGNOSIS — I70212 Atherosclerosis of native arteries of extremities with intermittent claudication, left leg: Secondary | ICD-10-CM

## 2024-02-09 DIAGNOSIS — I739 Peripheral vascular disease, unspecified: Secondary | ICD-10-CM | POA: Diagnosis not present

## 2024-02-09 LAB — VAS US ABI WITH/WO TBI
Left ABI: 1.09
Right ABI: 1.1

## 2024-02-09 NOTE — Progress Notes (Signed)
 Office Note     CC:  follow up Requesting Provider:  Marguerite Shiley, MD  HPI: Victoria Wolfe is a 78 y.o. (1946-03-05) female who presents for surveillance follow up of PAD. She recently underwent left CIA stenting by Dr. Vikki Graves on 06/28/23 for lifestyle limiting claudication. At her last visit in December her symptoms were resolved and she was doing great. She had no rest pain or tissue loss.   Today she denies any pain on ambulation or rest. No tissue loss. She tries to walk daily and otherwise remains active.She is medically managed on Aspirin , statin and Plavix .   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   Arthritis    Per [facility] New Patient Packet    B12 deficiency 03/20/2020   Back pain 09/28/2022   Bilateral malignant neoplasm of breast in female [facility]) 10/18/2018   Breast cancer (HCC)    Per [facility] New Patient Packet    Cancer of overlapping sites of left breast (HCC) 10/18/2018   Cataract    Cough 10/04/2023   Diabetes mellitus without complication (HCC)    Diverticulosis 05/10/2019   Elevated LFTs 09/15/2021   Essential hypertension 10/25/2015   Former smoker 05/18/2023   GERD (gastroesophageal reflux disease)    Hepatic steatosis 05/18/2023   High blood pressure    Per [facility] New Patient Packet    High cholesterol    Per [facility] New Patient Packet    History of colon polyps 05/10/2019   Hyperlipidemia LDL goal <100 10/25/2015   Hyperlipidemia LDL goal <130 10/25/2015   Osteopenia 10/07/2022   Peripheral arterial disease (HCC)    Pneumonia    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   Shingles    Type 2 diabetes mellitus without complication, without long-term current use of insulin (HCC) 09/15/2021   Vitamin D  deficiency 09/15/2021    Past Surgical History:  Procedure Laterality Date   ABDOMINAL AORTOGRAM W/LOWER EXTREMITY N/A 06/28/2023   Procedure: ABDOMINAL  AORTOGRAM W/LOWER EXTREMITY;  Surgeon: Adine Hoof, MD;  Location: [facility] INVASIVE CV LAB;  Service: Cardiovascular;  Laterality: N/A;   CATARACT EXTRACTION  2018   Per [facility] New Patient Packet, Dr.Todd Cannon Champion    CESAREAN SECTION     x2   COLONOSCOPY  2018   Per [facility] new patient packet, Dr.Locker   ILIAC ARTERY STENT N/A    2   MASTECTOMY Bilateral 02/02/2019   Per [facility] New Patient Packet, DrMichele Blackwood and Dr.Colon    MENISCECTOMY Right 2009   MENISCUS REPAIR  2004   Per [facility] New Patient Packet, Dr. Solomon Dupre   TOENAIL TRIMMING Left    ingrown in big toe   TONSILLECTOMY  1951   Per [facility] New Patient Packet    Social History   Socioeconomic History   Marital status: Divorced    Spouse name: Not on file   Number of children: 2   Years of education: Not on file   Highest education level: Master's degree (e.g., MA, MS, MEng, MEd, MSW, MBA)  Occupational History   Not on file  Tobacco Use   Smoking status: Former    Current packs/day: 0.00    Types: Cigarettes    Start date: 4    Quit date: 1995    Years since quitting: 30.4   Smokeless tobacco: Never  Vaping Use   Vaping status: Never Used  Substance and Sexual Activity   Alcohol use: Yes  Alcohol/week: 1.0 standard drink of alcohol    Types: 1 Glasses of wine per week    Comment: occ   Drug use: Never   Sexual activity: Not Currently    Partners: Male    Birth control/protection: Post-menopausal    Comment: older than 16, less than 5  Other Topics Concern   Not on file  Social History Narrative   Diet: Healthy      Caffeine: 1 cup of coffee in the morning       Married, if yes what year: Divorced, married in 1975      Do you live in a house, apartment, assisted living, [place], trailer, ect: Apartment, more than 1 stories, 1 person.       Pets: No      Current/Past profession: Masters, Training and development officer       Exercise: Yes, TV class 1 hour M-F         Living Will: Yes    DNR: No   POA/HPOA: Yes      Functional Status:   Do you have difficulty bathing or dressing yourself? Did not answer   Do you have difficulty preparing food or eating? Did not answer   Do you have difficulty managing your medications? Did not answer   Do you have difficulty managing your finances? Did not answer   Do you have difficulty affording your medications? Did not answer   Social Drivers of Health   Financial Resource Strain: Low Risk  (01/12/2024)   Overall Financial Resource Strain (CARDIA)    Difficulty of Paying Living Expenses: Not hard at all  Food Insecurity: No Food Insecurity (01/12/2024)   Hunger Vital Sign    Worried About Running Out of Food in the Last Year: Never true    Ran Out of Food in the Last Year: Never true  Transportation Needs: No Transportation Needs (01/12/2024)   PRAPARE - Administrator, Civil Service (Medical): No    Lack of Transportation (Non-Medical): No  Physical Activity: Sufficiently Active (01/12/2024)   Exercise Vital Sign    Days of Exercise per Week: 6 days    Minutes of Exercise per Session: 30 min  Stress: No Stress Concern Present (01/12/2024)   [employer] of Occupational Health - Occupational Stress Questionnaire    Feeling of Stress : Not at all  Social Connections: Moderately Integrated (01/12/2024)   Social Connection and Isolation Panel    Frequency of Communication with Friends and Family: More than three times a week    Frequency of Social Gatherings with Friends and Family: More than three times a week    Attends Religious Services: More than 4 times per year    Active Member of [employer] or Organizations: Yes    Attends Engineer, structural: More than 4 times per year    Marital Status: Divorced  Catering manager Violence: Not on file    Family History  Problem Relation Age of Onset   Alzheimer's disease Mother    Colon cancer Mother 27   Ulcers Father    Parkinsonism Father    Osteoporosis  Sister    Stomach cancer Neg Hx     Current Outpatient Medications  Medication Sig Dispense Refill   amLODipine  (NORVASC ) 5 MG tablet Take 1 tablet (5 mg total) by mouth daily. 90 tablet 3   aspirin  EC (ASPIRIN  ADULT LOW DOSE) 81 MG tablet Take 81 mg by mouth once.     benzonatate  (TESSALON ) 100  MG capsule Take 1 capsule (100 mg total) by mouth 3 (three) times daily as needed. 21 capsule 0   calcium  carbonate (OSCAL) 1500 (600 Ca) MG TABS tablet Take 2 tablets (3,000 mg total) by mouth daily with breakfast.     candesartan -hydrochlorothiazide (ATACAND  HCT) 32-12.5 MG tablet TAKE 1 TABLET BY MOUTH EVERY DAY 90 tablet 1   cholecalciferol (VITAMIN D3) 25 MCG (1000 UNIT) tablet Take 1,000 Units by mouth daily.     clopidogrel  (PLAVIX ) 75 MG tablet Take 1 tablet (75 mg total) by mouth daily. 30 tablet 11   Cyanocobalamin (VITAMIN B-12) 1000 MCG SUBL Take 1,000 mcg by mouth daily.     esomeprazole (NEXIUM) 20 MG capsule Take 20 mg by mouth daily at 12 noon.     ezetimibe  (ZETIA ) 10 MG tablet Take 1 tablet (10 mg total) by mouth daily. 90 tablet 3   gabapentin  (NEURONTIN ) 300 MG capsule Take 1 capsule (300 mg total) by mouth at bedtime. 90 capsule 3   loratadine  (CLARITIN ) 10 MG tablet Take 1 tablet (10 mg total) by mouth daily. 30 tablet 11   metFORMIN  (GLUCOPHAGE ) 500 MG tablet Take 1 tablet (500 mg total) by mouth 2 (two) times daily with a meal. 180 tablet 3   metoprolol  succinate (TOPROL  XL) 25 MG 24 hr tablet Take 0.5 tablets (12.5 mg total) by mouth daily. 45 tablet 3   nystatin  (MYCOSTATIN /NYSTOP ) powder Apply 1 Application topically 3 (three) times daily. 15 g 3   Polyethyl Glycol-Propyl Glycol (SYSTANE) 0.4-0.3 % SOLN Place 1 drop into both eyes every morning.     rosuvastatin  (CRESTOR ) 20 MG tablet TAKE 1 TABLET BY MOUTH EVERY DAY 90 tablet 3   Saline 0.9 % AERS Place 1 spray into the nose daily at 2 am. Simply Saline     triamcinolone  (NASACORT ) 55 MCG/ACT AERO nasal inhaler Place 2  sprays into the nose daily. 10.8 mL 0   vitamin C  (ASCORBIC ACID ) 500 MG tablet Take 500 mg by mouth daily.     No current facility-administered medications for this visit.    Allergies  Allergen Reactions   Amoxicillin  Rash    Had rash after taking amoxil  and zithromax , not sure which one caused the rash   Zithromax  [Azithromycin ] Rash    Had rash after taking zithromax  and amoxil  at the same time, not sure which antibiotic caused the rash     REVIEW OF SYSTEMS:  [X]  denotes positive finding, [ ]  denotes negative finding Cardiac  Comments:  Chest pain or chest pressure:    Shortness of breath upon exertion:    Short of breath when lying flat:    Irregular heart rhythm:        Vascular    Pain in calf, thigh, or hip brought on by ambulation:    Pain in feet at night that wakes you up from your sleep:     Blood clot in your veins:    Leg swelling:         Pulmonary    Oxygen at home:    Productive cough:     Wheezing:         Neurologic    Sudden weakness in arms or legs:     Sudden numbness in arms or legs:     Sudden onset of difficulty speaking or slurred speech:    Temporary loss of vision in one eye:     Problems with dizziness:         Gastrointestinal  Blood in stool:     Vomited blood:         Genitourinary    Burning when urinating:     Blood in urine:        Psychiatric    Major depression:         Hematologic    Bleeding problems:    Problems with blood clotting too easily:        Skin    Rashes or ulcers:        Constitutional    Fever or chills:      PHYSICAL EXAMINATION:  Vitals:   02/09/24 0837  BP: 126/73  Pulse: 77  Temp: 98.1 F (36.7 C)  TempSrc: Temporal  SpO2: 95%  Weight: 184 lb 12.8 oz (83.8 kg)  Height: 5' 7.5 (1.715 m)    General:  WDWN in NAD; vital signs documented above Gait: Normal HENT: WNL, normocephalic Pulmonary: normal non-labored breathing Cardiac: regular HR Vascular Exam/Pulses: 2+ radial, 2+  femoral, 2+ DP pulses bilaterally. Feet warm and well perfused Extremities: without ischemic changes, without Gangrene , without cellulitis; without open wounds;  Musculoskeletal: no muscle wasting or atrophy  Neurologic: A&O X 3 Psychiatric:  The pt has Normal affect.   Non-Invasive Vascular Imaging:   +-------+-----------+-----------+------------+------------+  ABI/TBIToday's ABIToday's TBIPrevious ABIPrevious TBI  +-------+-----------+-----------+------------+------------+  Right 1.10      0.71       1.19       0.89     +-------+-----------+-----------+------------+------------+  Left  1.09       0.88       1.25        0.92         +-------+-----------+-----------+------------+------------+    Bilateral ABIs and TBIs appear essentially unchanged compared to prior  study on 07/29/2023.   VAS US  Aorta/iliac/IVC: Summary:  Abdominal Aorta: The largest aortic measurement is 1.9 cm.  Stenosis: +-------------------+-----------+-----------+  Location           Stent      Comments     +-------------------+-----------+-----------+  Left Common Iliac  no stenosis             +-------------------+-----------+-----------+  Left External Iliac           no stenosis  +-------------------+-----------+-----------+   IVC/Iliac: Patent IVC.   ASSESSMENT/PLAN:: 78 y.o. female here for surveillance follow up of PAD. She recently underwent left CIA stenting by Dr. Vikki Graves on 06/28/23 for lifestyle limiting claudication. Her symptoms remain resolved post intervention. She has no claudication, rest pain or tissue loss. -continue Aspirin , Plavix , statin - Continue walking regimen - she will follow up in 1 year with repeat Aorto/iliac duplex and ABI   Deneen Finical, PA-C [facility] [phone]  Clinic MD:   Vikki Graves

## 2024-03-14 ENCOUNTER — Ambulatory Visit (INDEPENDENT_AMBULATORY_CARE_PROVIDER_SITE_OTHER): Admitting: Audiology

## 2024-03-14 ENCOUNTER — Ambulatory Visit (INDEPENDENT_AMBULATORY_CARE_PROVIDER_SITE_OTHER): Admitting: Otolaryngology

## 2024-03-14 ENCOUNTER — Encounter (INDEPENDENT_AMBULATORY_CARE_PROVIDER_SITE_OTHER): Payer: Self-pay | Admitting: Otolaryngology

## 2024-03-14 VITALS — BP 147/85 | HR 75

## 2024-03-14 DIAGNOSIS — H9313 Tinnitus, bilateral: Secondary | ICD-10-CM | POA: Diagnosis not present

## 2024-03-14 DIAGNOSIS — H903 Sensorineural hearing loss, bilateral: Secondary | ICD-10-CM

## 2024-03-14 NOTE — Progress Notes (Signed)
  [address], [address] [place], [place] [address] [phone]  Audiological Evaluation    Name: Victoria Wolfe     DOB:   [birth year]      MRN:   [id number]                                                                                     Service Date: 03/14/2024     Accompanied by: unaccompanied   Patient comes today after Dr. Karis, ENT sent a referral for a hearing evaluation due to concerns with tinnitus.   Symptoms Yes Details  Hearing loss  []  Some difficulty at the dining hall  Tinnitus  [x]  Both ears- perceived when it is quiet or at night; onset around December 2025 when she was having rapid heart rate and night sweats  Ear pain/ infections/pressure  []    Balance problems  []    Noise exposure history  []    Previous ear surgeries  []    Family history of hearing loss  []    Amplification  []    Other  []      Otoscopy: Right ear: Clear external ear canal and notable landmarks visualized on the tympanic membrane. Left ear:  Clear external ear canal and notable landmarks visualized on the tympanic membrane.  Tympanometry: Right ear: Type As- Normal external ear canal volume with normal middle ear pressure and low tympanic membrane compliance. Left ear: Type A- Normal external ear canal volume with normal middle ear pressure and tympanic membrane compliance.    Pure tone Audiometry: Both ears- Normal hearing from [id number] Hz, then mild to moderately severe sensorineural hearing loss from 3000 Hz - 8000 Hz.  Speech Audiometry: Right ear- Speech Reception Threshold (SRT) was obtained at 15 dBHL. Left ear-Speech Reception Threshold (SRT) was obtained at 15 dBHL.   Word Recognition Score Tested using NU-6 (recorded) Right ear: 100% was obtained at a presentation level of 60 dBHL with contralateral masking which is deemed as  excellent. Left ear: 100% was obtained at a presentation level of 60 dBHL with contralateral masking which is deemed as  excellent.   The hearing  test results were completed under headphones and results are deemed to be of good reliability. Test technique:  conventional     Recommendations: Follow up with ENT as scheduled for today. Return for a hearing evaluation if concerns with hearing changes arise or per MD recommendation. Consider various tinnitus strategies, including the use of a sound generator, hearing aids, and/or tinnitus retraining therapy.    Rossi Burdo MARIE LEROUX-MARTINEZ, AUD

## 2024-03-15 DIAGNOSIS — H903 Sensorineural hearing loss, bilateral: Secondary | ICD-10-CM | POA: Insufficient documentation

## 2024-03-15 DIAGNOSIS — H9313 Tinnitus, bilateral: Secondary | ICD-10-CM | POA: Insufficient documentation

## 2024-03-15 NOTE — Progress Notes (Signed)
 CC: Bilateral tinnitus  HPI:  Victoria Wolfe is a 78 y.o. female who presents today complaining of bilateral tinnitus for the past 7+ months.  She describes the tinnitus as a constant high-pitched ringing noise.  It is nonpulsatile.  The patient has no previous history of otitis media or otitis externa.  She has no previous otologic surgery.  Currently she denies any otalgia, otorrhea, or vertigo.  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   Arthritis    Per [facility] New Patient Packet    B12 deficiency 03/20/2020   Back pain 09/28/2022   Bilateral malignant neoplasm of breast in female [facility]) 10/18/2018   Breast cancer [facility])    Per [facility] New Patient Packet    Cancer of overlapping sites of left breast (HCC) 10/18/2018   Cataract    Cough 10/04/2023   Diabetes mellitus without complication (HCC)    Diverticulosis 05/10/2019   Elevated LFTs 09/15/2021   Essential hypertension 10/25/2015   Former smoker 05/18/2023   GERD (gastroesophageal reflux disease)    Hepatic steatosis 05/18/2023   High blood pressure    Per [facility] New Patient Packet    High cholesterol    Per [facility] New Patient Packet    History of colon polyps 05/10/2019   Hyperlipidemia LDL goal <100 10/25/2015   Hyperlipidemia LDL goal <130 10/25/2015   Osteopenia 10/07/2022   Peripheral arterial disease (HCC)    Pneumonia    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   Shingles    Type 2 diabetes mellitus without complication, without long-term current use of insulin (HCC) 09/15/2021   Vitamin D  deficiency 09/15/2021    Past Surgical History:  Procedure Laterality Date   ABDOMINAL AORTOGRAM W/LOWER EXTREMITY N/A 06/28/2023   Procedure: ABDOMINAL AORTOGRAM W/LOWER EXTREMITY;  Surgeon: Sheree Penne Bruckner, MD;  Location: [facility] INVASIVE CV LAB;  Service: Cardiovascular;  Laterality: N/A;   CATARACT EXTRACTION  2018    Per [facility] New Patient Packet, Dr.Todd Rolene    CESAREAN SECTION     x2   COLONOSCOPY  2018   Per [facility] new patient packet, Dr.Locker   ILIAC ARTERY STENT N/A    2   MASTECTOMY Bilateral 02/02/2019   Per [facility] New Patient Packet, DrMichele Blackwood and Dr.Colon    MENISCECTOMY Right 2009   MENISCUS REPAIR  2004   Per [facility] New Patient Packet, Dr. Michaele   TOENAIL TRIMMING Left    ingrown in big toe   TONSILLECTOMY  1951   Per [facility] New Patient Packet    Family History  Problem Relation Age of Onset   Alzheimer's disease Mother    Colon cancer Mother 46   Ulcers Father    Parkinsonism Father    Osteoporosis Sister    Stomach cancer Neg Hx     Social History:  reports that she quit smoking about 30 years ago. Her smoking use included cigarettes. She started smoking about 55 years ago. She has never used smokeless tobacco. She reports current alcohol use of about 1.0 standard drink of alcohol per week. She reports that she does not use drugs.  Allergies:  Allergies  Allergen Reactions   Amoxicillin  Rash    Had rash after taking amoxil  and zithromax , not sure which one caused the rash   Zithromax  [Azithromycin ] Rash    Had rash after taking zithromax  and amoxil  at the same time, not sure which antibiotic caused the rash  Prior to Admission medications   Medication Sig Start Date End Date Taking? Authorizing Provider  amLODipine  (NORVASC ) 5 MG tablet Take 1 tablet (5 mg total) by mouth daily. 07/12/23  Yes Charlanne Fredia CROME, MD  aspirin  EC (ASPIRIN  ADULT LOW DOSE) 81 MG tablet Take 81 mg by mouth once. 04/21/23  Yes [provider]  benzonatate  (TESSALON ) 100 MG capsule Take 1 capsule (100 mg total) by mouth 3 (three) times daily as needed. 12/20/23  Yes Silver Wonda LABOR, PA  calcium  carbonate (OSCAL) 1500 (600 Ca) MG TABS tablet Take 2 tablets (3,000 mg total) by mouth daily with breakfast. 09/20/23  Yes Wert, Tawni, NP  candesartan -hydrochlorothiazide (ATACAND  HCT)  32-12.5 MG tablet TAKE 1 TABLET BY MOUTH EVERY DAY 11/15/23  Yes Gupta, Anjali L, MD  cholecalciferol (VITAMIN D3) 25 MCG (1000 UNIT) tablet Take 1,000 Units by mouth daily.   Yes [provider]  clopidogrel  (PLAVIX ) 75 MG tablet Take 1 tablet (75 mg total) by mouth daily. 06/28/23 06/27/24 Yes Sheree Penne Bruckner, MD  Cyanocobalamin (VITAMIN B-12) 1000 MCG SUBL Take 1,000 mcg by mouth daily. 03/21/20  Yes [provider]  esomeprazole (NEXIUM) 20 MG capsule Take 20 mg by mouth daily at 12 noon.   Yes [provider]  ezetimibe  (ZETIA ) 10 MG tablet Take 1 tablet (10 mg total) by mouth daily. 11/11/23 03/14/24 Yes Tobb, Kardie, DO  gabapentin  (NEURONTIN ) 300 MG capsule Take 1 capsule (300 mg total) by mouth at bedtime. 02/03/24  Yes Hines, Genesis V, MD  loratadine  (CLARITIN ) 10 MG tablet Take 1 tablet (10 mg total) by mouth daily. 01/12/24  Yes Ngetich, Dinah C, NP  metFORMIN  (GLUCOPHAGE ) 500 MG tablet Take 1 tablet (500 mg total) by mouth 2 (two) times daily with a meal. 05/21/23  Yes Charlanne Fredia CROME, MD  metoprolol  succinate (TOPROL  XL) 25 MG 24 hr tablet Take 0.5 tablets (12.5 mg total) by mouth daily. 10/29/23  Yes Monetta Redell PARAS, MD  nystatin  (MYCOSTATIN /NYSTOP ) powder Apply 1 Application topically 3 (three) times daily. 10/28/23  Yes Charlanne Fredia CROME, MD  Polyethyl Glycol-Propyl Glycol (SYSTANE) 0.4-0.3 % SOLN Place 1 drop into both eyes every morning.   Yes [provider]  rosuvastatin  (CRESTOR ) 20 MG tablet TAKE 1 TABLET BY MOUTH EVERY DAY 04/29/23  Yes Gupta, Anjali L, MD  Saline 0.9 % AERS Place 1 spray into the nose daily at 2 am. Simply Saline   Yes [provider]  triamcinolone  (NASACORT ) 55 MCG/ACT AERO nasal inhaler Place 2 sprays into the nose daily. 01/12/24  Yes Ngetich, Dinah C, NP  vitamin C  (ASCORBIC ACID ) 500 MG tablet Take 500 mg by mouth daily.   Yes [provider]    Blood pressure (!) 147/85, pulse 75, SpO2  95%. Exam: General: Communicates without difficulty, well nourished, no acute distress. Head: Normocephalic, no evidence injury, no tenderness, facial buttresses intact without stepoff. Face/sinus: No tenderness to palpation and percussion. Facial movement is normal and symmetric. Eyes: PERRL, EOMI. No scleral icterus, conjunctivae clear. Neuro: CN II exam reveals vision grossly intact.  No nystagmus at any point of gaze. Ears: Auricles well formed without lesions.  Ear canals are intact without mass or lesion.  No erythema or edema is appreciated.  The TMs are intact without fluid. Nose: External evaluation reveals normal support and skin without lesions.  Dorsum is intact.  Anterior rhinoscopy reveals normal mucosa over anterior aspect of inferior turbinates and intact septum.  No purulence noted. Oral:  Oral  cavity and oropharynx are intact, symmetric, without erythema or edema.  Mucosa is moist without lesions. Neck: Full range of motion without pain.  There is no significant lymphadenopathy.  No masses palpable.  Thyroid bed within normal limits to palpation.  Parotid glands and submandibular glands equal bilaterally without mass.  Trachea is midline. Neuro:  CN 2-12 grossly intact.   Her hearing test shows bilateral symmetric high-frequency sensorineural hearing loss.  Assessment: 1.  Bilateral symmetric high-frequency sensorineural hearing loss, likely secondary to routine presbycusis. 2.  The patient's bilateral tinnitus is likely a direct result of the hearing loss. 3.  Her ear canals, tympanic membranes, and middle ear spaces are all normal.  Plan: 1.  The physical exam findings and the hearing test results are reviewed with the patient. 2.  The strategies to cope with tinnitus, including the use of masker, hearing aids, tinnitus retraining therapy, and avoidance of caffeine and alcohol are discussed. 3.  The patient will return for reevaluation in 1 year, sooner if needed.  Nilani Hugill W  Tauren Delbuono 03/15/2024, 8:25 AM

## 2024-03-20 ENCOUNTER — Encounter: Payer: Self-pay | Admitting: Audiology

## 2024-05-02 DIAGNOSIS — K080 Exfoliation of teeth due to systemic causes: Secondary | ICD-10-CM | POA: Diagnosis not present

## 2024-05-04 DIAGNOSIS — K080 Exfoliation of teeth due to systemic causes: Secondary | ICD-10-CM | POA: Diagnosis not present

## 2024-05-05 ENCOUNTER — Encounter: Payer: Self-pay | Admitting: Cardiology

## 2024-05-06 ENCOUNTER — Other Ambulatory Visit: Payer: Self-pay | Admitting: Cardiology

## 2024-05-11 DIAGNOSIS — M19071 Primary osteoarthritis, right ankle and foot: Secondary | ICD-10-CM | POA: Diagnosis not present

## 2024-05-11 DIAGNOSIS — E119 Type 2 diabetes mellitus without complications: Secondary | ICD-10-CM | POA: Diagnosis not present

## 2024-05-12 ENCOUNTER — Other Ambulatory Visit: Payer: Self-pay | Admitting: Internal Medicine

## 2024-05-12 DIAGNOSIS — I10 Essential (primary) hypertension: Secondary | ICD-10-CM

## 2024-05-12 DIAGNOSIS — E785 Hyperlipidemia, unspecified: Secondary | ICD-10-CM

## 2024-05-15 ENCOUNTER — Ambulatory Visit: Payer: Self-pay | Admitting: Cardiology

## 2024-05-16 DIAGNOSIS — I110 Hypertensive heart disease with heart failure: Secondary | ICD-10-CM | POA: Diagnosis not present

## 2024-05-16 LAB — BASIC METABOLIC PANEL WITH GFR
BUN: 16 (ref 4–21)
CO2: 23 — AB (ref 13–22)
Chloride: 103 (ref 99–108)
Creatinine: 0.7 (ref 0.5–1.1)
Glucose: 161
Potassium: 4.3 meq/L (ref 3.5–5.1)
Sodium: 141 (ref 137–147)

## 2024-05-16 LAB — HEMOGLOBIN A1C: Hemoglobin A1C: 7.5

## 2024-05-16 LAB — COMPREHENSIVE METABOLIC PANEL WITH GFR
Albumin: 4.5 (ref 3.5–5.0)
Calcium: 10 (ref 8.7–10.7)
eGFR: 89

## 2024-05-16 LAB — CBC: RBC: 4.16 (ref 3.87–5.11)

## 2024-05-16 LAB — HEPATIC FUNCTION PANEL
ALT: 73 U/L — AB (ref 7–35)
AST: 71 — AB (ref 13–35)
Alkaline Phosphatase: 76 (ref 25–125)
Bilirubin, Direct: 0.21 (ref 0.01–0.4)
Bilirubin, Total: 0.4

## 2024-05-16 LAB — LIPID PANEL
Cholesterol: 138 (ref 0–200)
HDL: 49 (ref 35–70)
LDL Cholesterol: 61
Triglycerides: 142 (ref 40–160)

## 2024-05-16 LAB — CBC AND DIFFERENTIAL
HCT: 40 (ref 36–46)
Hemoglobin: 13.4 (ref 12.0–16.0)
Platelets: 214 K/uL (ref 150–400)
WBC: 5.9

## 2024-05-16 LAB — TSH: TSH: 3.53 (ref 0.41–5.90)

## 2024-05-17 ENCOUNTER — Encounter: Payer: Self-pay | Admitting: Internal Medicine

## 2024-05-22 ENCOUNTER — Encounter: Payer: Self-pay | Admitting: Adult Health

## 2024-05-22 ENCOUNTER — Non-Acute Institutional Stay: Admitting: Adult Health

## 2024-05-22 VITALS — BP 112/78 | HR 75 | Temp 97.7°F | Ht 67.5 in | Wt 185.6 lb

## 2024-05-22 DIAGNOSIS — I10 Essential (primary) hypertension: Secondary | ICD-10-CM

## 2024-05-22 DIAGNOSIS — I739 Peripheral vascular disease, unspecified: Secondary | ICD-10-CM

## 2024-05-22 DIAGNOSIS — E785 Hyperlipidemia, unspecified: Secondary | ICD-10-CM

## 2024-05-22 DIAGNOSIS — R002 Palpitations: Secondary | ICD-10-CM

## 2024-05-22 DIAGNOSIS — M8580 Other specified disorders of bone density and structure, unspecified site: Secondary | ICD-10-CM

## 2024-05-22 DIAGNOSIS — K580 Irritable bowel syndrome with diarrhea: Secondary | ICD-10-CM | POA: Diagnosis not present

## 2024-05-22 DIAGNOSIS — F1090 Alcohol use, unspecified, uncomplicated: Secondary | ICD-10-CM

## 2024-05-22 DIAGNOSIS — R7989 Other specified abnormal findings of blood chemistry: Secondary | ICD-10-CM

## 2024-05-22 DIAGNOSIS — E1159 Type 2 diabetes mellitus with other circulatory complications: Secondary | ICD-10-CM | POA: Diagnosis not present

## 2024-05-22 MED ORDER — BLOOD GLUCOSE TEST VI STRP
1.0000 | ORAL_STRIP | 0 refills | Status: AC
Start: 2024-05-22 — End: 2024-06-21

## 2024-05-22 MED ORDER — LANCETS MISC. MISC: 1.0000 | Freq: Three times a day (TID) | 0 refills | Status: AC

## 2024-05-22 MED ORDER — METFORMIN HCL 750 MG PO TABS: 750.0000 mg | ORAL_TABLET | Freq: Two times a day (BID) | ORAL | 2 refills | Status: DC

## 2024-05-22 MED ORDER — LANCET DEVICE MISC: 1.0000 | Freq: Three times a day (TID) | 0 refills | Status: AC

## 2024-05-22 MED ORDER — CLOPIDOGREL BISULFATE 75 MG PO TABS: 75.0000 mg | ORAL_TABLET | Freq: Every day | ORAL | Status: DC

## 2024-05-22 MED ORDER — BLOOD GLUCOSE TEST VI STRP: 1.0000 | ORAL_STRIP | Freq: Three times a day (TID) | 0 refills | Status: DC

## 2024-05-22 MED ORDER — BLOOD GLUCOSE MONITORING SUPPL DEVI: 1.0000 | Freq: Three times a day (TID) | 0 refills | Status: AC

## 2024-05-22 NOTE — Patient Instructions (Signed)
  VISIT SUMMARY: Victoria Wolfe, a 78 year old female with type 2 diabetes, visited for management of her diabetes. Her diabetes is currently managed with metformin , but her recent A1c indicates suboptimal control. She also experiences gastrointestinal issues, likely related to stress and possibly IBS. Additionally, she has a history of fatty liver disease, osteopenia, and well-controlled cholesterol and blood pressure. She plans to receive COVID and flu vaccines soon.  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 Your A1c level of 7.5% indicates that your diabetes is not optimally controlled. We will increase your metformin  dose to 750 mg twice daily, starting with 750 mg in the morning and 500 mg in the evening for the first week. If you do not tolerate this adjustment, we can make further changes. You should also focus on dietary modifications, particularly increasing complex carbohydrates and fiber. Additionally, you will be prescribed glucose test strips to monitor your blood sugar three mornings a week for one month.   -FATTY LIVER DISEASE: Fatty liver disease is a condition where fat builds up in the liver, which can be exacerbated by alcohol consumption. Your recent elevated liver function tests are likely due to recent alcohol consumption. Continued abstinence from alcohol should improve your liver function.  -HYPERTENSION: Hypertension is high blood pressure. Your blood pressure is well-controlled in the clinic but tends to be higher in the mornings at home. You are currently taking Norvasc  and Toprol  for this condition.  -HYPERLIPIDEMIA: Hyperlipidemia is high cholesterol. Your cholesterol levels are well-controlled with Crestor .  -OSTEOPENIA: Osteopenia is a condition where bone density is lower than normal, which can lead to fractures. Your last bone  density scan in 2024 showed osteopenia. We will schedule your next bone density scan for 2026.  -VACCINATIONS: You plan to receive COVID and flu vaccines soon. We also discussed the RSV vaccine, which will be considered based on your insurance coverage.  INSTRUCTIONS: Increase metformin  to 750 mg twice daily, starting with 750 mg in the morning and 500 mg in the evening for the first week. Monitor your blood sugar three mornings a week for one month using the prescribed glucose test strips. Administer COVID and flu vaccines soon. Consider RSV vaccine based on insurance coverage.                      Contains text generated by [employer].                                 Contains text generated by [employer]bridge.

## 2024-05-22 NOTE — Progress Notes (Signed)
 "  Location:  [facility]  POS: Clinic  Provider: Tawni America, ANP   Goals of Care:     01/12/2024    2:31 PM  Advanced Directives  Does Patient Have a Medical Advance Directive? Yes  Type of Estate Agent of Success;Living will  Does patient want to make changes to medical advance directive? No - Patient declined  Copy of Healthcare Power of Attorney in Chart? Yes - validated most recent copy scanned in chart (See row information)     Chief Complaint  Patient presents with   Follow-up    4 month follow up    HPI: Discussed the use of AI scribe software for clinical note transcription with the patient, who gave verbal consent to proceed.  History of Present Illness Victoria Wolfe is a 78 year old female with type 2 diabetes who presents for management of her diabetes and HLD  Glycemic control and dietary habits - Type 2 diabetes managed with metformin  500 mg twice daily - Recent hemoglobin A1c of 7.5% - Fasting glucose level of 161 mg/dL - Increased stress has led to increased nighttime snacking and weight gain - Increased wine consumption during periods of stress, abstinent from wine for the past eight days - Engages in daily stretching and core exercises for 15 minutes and walks when possible  Gastrointestinal symptoms - Experiences alternating 'explosive diarrhea' and constipation, often associated with stress - Loose stools with several morning bowel movements - Occasional abdominal pain with diarrhea - Follows a BRAT diet during episodes of stomach upset, limiting fruit and vegetable intake - History of diverticulitis -this has been going on for years and is not new, even before starting metformin   Hepatic function - History of fatty liver disease, 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  - Recent liver function tests: ALT 73, AST 71 rising  Lipid management and vascular health - Cholesterol well-controlled on Crestor  - Takes Norvasc , Toprol , aspirin , and Plavix  for  vascular health  Blood pressure variability - Blood pressure elevated in the mornings, lowers in the afternoons after medication administration  Bone health - Osteopenia with regular bone density scans  Hx of bilateral mastectomy  Immunization status - Plans to receive COVID and influenza vaccines soon   Hx of left common iliac artery stent 07/2023 Asa, plavix , statin  Palpitations Seen Cardiology Zio monitor showed Atrial tachycardia On beta blocker.  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   Arthritis    Per [facility] New Patient Packet    B12 deficiency 03/20/2020   Back pain 09/28/2022   Bilateral malignant neoplasm of breast in female [facility]) 10/18/2018   Breast cancer (HCC)    Per [facility] New Patient Packet    Cancer of overlapping sites of left breast (HCC) 10/18/2018   Cataract    Cough 10/04/2023   Diabetes mellitus without complication (HCC)    Diverticulosis 05/10/2019   Elevated LFTs 09/15/2021   Essential hypertension 10/25/2015   Former smoker 05/18/2023   GERD (gastroesophageal reflux disease)    Hepatic steatosis 05/18/2023   High blood pressure    Per [facility] New Patient Packet    High cholesterol    Per [facility] New Patient Packet    History of colon polyps 05/10/2019   Hyperlipidemia LDL goal <100 10/25/2015   Hyperlipidemia LDL goal <130 10/25/2015   Osteopenia 10/07/2022   Peripheral arterial disease    Pneumonia    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 INVASIVE CV LAB;  Service: Cardiovascular;  Laterality: N/A;   CATARACT EXTRACTION  2018   Per [facility] New Patient Packet, Dr.Todd Leventhal    CESAREAN SECTION     x2   COLONOSCOPY  2018   Per [facility] new patient packet, Dr.Locker   ILIAC ARTERY STENT N/A    2   MASTECTOMY Bilateral 02/02/2019   Per [facility] New Patient Packet, DrMichele Blackwood and Dr.Colon    MENISCECTOMY Right 2009   MENISCUS REPAIR  2004   Per [facility] New Patient Packet, Dr. Michaele   TOENAIL TRIMMING Left    ingrown in big toe   TONSILLECTOMY  1951   Per [facility] New Patient Packet    Allergies  Allergen Reactions   Amoxicillin  Rash    Had rash after taking amoxil  and zithromax , not sure which one caused the rash   Zithromax  [Azithromycin ] Rash    Had rash after taking zithromax  and amoxil  at the same time, not sure which antibiotic caused the rash    Outpatient Encounter Medications as of 05/22/2024  Medication Sig   amLODipine  (NORVASC ) 5 MG tablet Take 1 tablet (5 mg total) by mouth daily.   aspirin  EC (ASPIRIN  ADULT LOW DOSE) 81 MG tablet Take 81 mg by mouth once.   benzonatate  (TESSALON ) 100 MG capsule Take 1 capsule (100 mg total) by mouth 3 (three) times daily as needed.   candesartan -hydrochlorothiazide (ATACAND  HCT) 32-12.5 MG tablet TAKE 1 TABLET BY MOUTH EVERY DAY   cholecalciferol (VITAMIN D3) 25 MCG (1000 UNIT) tablet Take 1,000 Units by mouth daily.   Cyanocobalamin  (VITAMIN B-12) 1000 MCG SUBL Take 1,000 mcg by mouth daily.   esomeprazole (NEXIUM) 20 MG capsule Take 20 mg by mouth daily at 12 noon.   ezetimibe  (ZETIA ) 10 MG tablet TAKE 1 TABLET BY MOUTH EVERY DAY   gabapentin  (NEURONTIN ) 300 MG capsule Take 1 capsule (300 mg total) by mouth at bedtime.   metFORMIN  (GLUCOPHAGE ) 500 MG tablet TAKE 1 TABLET BY MOUTH 2 TIMES DAILY WITH A MEAL.   metoprolol  succinate (TOPROL  XL) 25 MG 24 hr tablet Take 0.5 tablets (12.5 mg total) by mouth daily.   nystatin  (MYCOSTATIN /NYSTOP ) powder Apply 1  Application topically 3 (three) times daily.   Polyethyl Glycol-Propyl Glycol (SYSTANE) 0.4-0.3 % SOLN Place 1 drop into both eyes every morning.   rosuvastatin  (CRESTOR ) 20 MG tablet TAKE 1 TABLET BY MOUTH EVERY DAY   Saline 0.9 % AERS Place 1 spray into the nose daily at 2 am. Simply Saline   thiamine (VITAMIN B-1) 100 MG tablet Take 100 mg by mouth daily.   triamcinolone  (NASACORT ) 55 MCG/ACT AERO nasal inhaler Place 2 sprays into the nose daily.   vitamin C (ASCORBIC ACID ) 500 MG tablet Take 500 mg by mouth daily.   calcium  carbonate (OSCAL) 1500 (600 Ca) MG TABS tablet Take 2 tablets (3,000 mg total) by mouth daily with breakfast. (Patient not taking: Reported on 05/22/2024)   clopidogrel  (PLAVIX ) 75 MG tablet Take 1 tablet (75 mg total) by mouth daily. (Patient not taking: Reported on 05/22/2024)   loratadine  (CLARITIN ) 10 MG  tablet Take 1 tablet (10 mg total) by mouth daily. (Patient not taking: Reported on 05/22/2024)   [DISCONTINUED] ezetimibe  (ZETIA ) 10 MG tablet Take 1 tablet (10 mg total) by mouth daily.   No facility-administered encounter medications on file as of 05/22/2024.    Review of Systems:  Review of Systems  Constitutional:  Negative for activity change, appetite change, chills, diaphoresis, fatigue and fever.  HENT:  Negative for congestion.   Respiratory:  Negative for cough, shortness of breath and wheezing.   Cardiovascular:  Negative for chest pain and leg swelling.  Gastrointestinal:  Positive for abdominal pain (cramping) and diarrhea. Negative for abdominal distention, anal bleeding, constipation, nausea and vomiting.  Genitourinary:  Negative for difficulty urinating, dysuria and urgency.  Musculoskeletal:  Negative for back pain, gait problem, myalgias and neck pain.  Skin:  Negative for rash.  Neurological:  Negative for dizziness and weakness.  Psychiatric/Behavioral:  Negative for confusion.     Health Maintenance  Topic Date Due   Diabetic kidney  evaluation - Urine ACR  Never done   Influenza Vaccine  06/22/2024 (Originally 03/24/2024)   COVID-19 Vaccine (8 - Moderna risk 2024-25 season) 06/22/2024 (Originally 04/24/2024)   Medicare Annual Wellness (AWV)  08/18/2024 (Originally 05/09/2024)   OPHTHALMOLOGY EXAM  11/03/2024 (Originally 10/23/2023)   DTaP/Tdap/Td (2 - Td or Tdap) 08/27/2024   FOOT EXAM  09/19/2024   HEMOGLOBIN A1C  11/13/2024   Diabetic kidney evaluation - eGFR measurement  05/16/2025   Pneumococcal Vaccine: 50+ Years  Completed   DEXA SCAN  Completed   Hepatitis C Screening  Completed   Zoster Vaccines- Shingrix  Completed   HPV VACCINES  Aged Out   Meningococcal B Vaccine  Aged Out   Hepatitis B Vaccines 19-59 Average Risk  Discontinued   Mammogram  Discontinued   Colonoscopy  Discontinued    Physical Exam: Vitals:   05/22/24 1401  BP: 112/78  Pulse: 75  Temp: 97.7 F (36.5 C)  SpO2: 99%  Weight: 185 lb 9.6 oz (84.2 kg)  Height: 5' 7.5 (1.715 m)   Body mass index is 28.64 kg/m. Wt Readings from Last 3 Encounters:  05/22/24 185 lb 9.6 oz (84.2 kg)  02/09/24 184 lb 12.8 oz (83.8 kg)  01/18/24 185 lb 9.6 oz (84.2 kg)    Physical Exam Vitals reviewed.  Constitutional:      General: She is not in acute distress.    Appearance: She is not diaphoretic.  HENT:     Head: Normocephalic and atraumatic.     Right Ear: Tympanic membrane normal.     Left Ear: Tympanic membrane normal.     Nose: Nose normal.     Mouth/Throat:     Mouth: Mucous membranes are moist.     Pharynx: Oropharynx is clear.  Eyes:     Conjunctiva/sclera: Conjunctivae normal.     Pupils: Pupils are equal, round, and reactive to light.  Neck:     Vascular: No JVD.  Cardiovascular:     Rate and Rhythm: Normal rate and regular rhythm.     Heart sounds: No murmur heard. Pulmonary:     Effort: Pulmonary effort is normal. No respiratory distress.     Breath sounds: Normal breath sounds. No wheezing.  Abdominal:     General: Abdomen is  flat. Bowel sounds are normal. There is no distension.     Palpations: Abdomen is soft.     Tenderness: There is no abdominal tenderness. There is no right CVA tenderness or  left CVA tenderness.  Musculoskeletal:     Right lower leg: No edema.     Left lower leg: No edema.  Skin:    General: Skin is warm and dry.  Neurological:     Mental Status: She is alert and oriented to person, place, and time.  Psychiatric:        Mood and Affect: Mood normal.     Labs reviewed: Basic Metabolic Panel: Recent Labs    05/30/23 0923 06/28/23 0606 01/11/24 0000 01/11/24 1549 05/16/24 0000  NA 138 143 137  --  141  K 3.5 3.8 4.4  --  4.3  CL 103 105 100  --  103  CO2 25  --  22  --  23*  GLUCOSE 154* 139*  --   --   --   BUN 15 17 16   --  16  CREATININE 0.60 0.60 0.6  --  0.7  CALCIUM  9.4  --   --  9.5 10.0  TSH  --   --   --   --  3.53   Liver Function Tests: Recent Labs    05/30/23 0923 09/14/23 0000 01/11/24 0000 01/11/24 1549 05/16/24 0000  AST 23 36* 51*  --  71*  ALT 31 48* 53*  --  73*  ALKPHOS 61 81 84  --  76  BILITOT 0.6  --   --   --   --   PROT 7.0  --   --   --   --   ALBUMIN 4.2 4.5  --  4.3 4.5   No results for input(s): LIPASE, AMYLASE in the last 8760 hours. No results for input(s): AMMONIA in the last 8760 hours. CBC: Recent Labs    05/30/23 0923 06/28/23 0606 01/11/24 0000 05/16/24 0000  WBC 9.2  --  6.9 5.9  NEUTROABS 6.8  --   --   --   HGB 12.7 11.6* 12.6 13.4  HCT 36.7 34.0* 97* 40  MCV 93.1  --   --   --   PLT 226  --  216 214   Lipid Panel: Recent Labs    09/14/23 0000 01/11/24 0000 05/16/24 0000  CHOL 173 142 138  HDL 59 51 49  LDLCALC 95 70 61  TRIG 95 105 142   Lab Results  Component Value Date   HGBA1C 7.5 05/16/2024    Procedures since last visit: No results found.  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 Early peripheral neuropathy noted - Increase metformin  to 750 mg twice daily, starting with 750 mg in the morning and 500 mg in the evening for the first week. - Consider Ozempic if metformin  adjustment is not tolerated. - Encourage dietary modifications focusing on complex carbohydrates and fiber. - Prescribe glucose test strips for home monitoring of blood sugar three mornings a week for one month.  Chronic gastrointestinal symptoms with diverticulosis Alternating diarrhea and constipation, possibly IBS-related. Metformin  may exacerbate symptoms. - Monitor GI symptoms with metformin  dosage adjustment. - Consider alternative diabetes medications if GI symptoms worsen.  Fatty liver disease Elevated ALT and AST likely due to recent alcohol consumption. Abstinence should improve liver function. - Encourage continued abstinence from alcohol.  Alcohol use, currently abstinent Abstinent for eight days. Previous increased consumption due to stress. - Encourage continued abstinence from alcohol.  Hypertension Blood pressure well-controlled in clinic. Higher morning readings at home.  On Norvasc  and Toprol .  Hyperlipidemia Cholesterol levels well-controlled with Crestor .  Osteopenia Bone density scan in 2024 showed osteopenia. - Schedule bone density scan for 2026.  Vaccinations COVID and flu vaccines planned. RSV vaccine discussed. - Administer COVID and flu vaccines. - Consider RSV vaccine based on insurance coverage.  PVD On asa, plavix , statin S/p stenting    Labs/tests ordered:  * No order type specified * Next appt:  F/U 1 month  Total time :  time greater than 50% of total time spent doing pt counseling and coordination of care      "

## 2024-05-24 NOTE — Progress Notes (Signed)
 Victoria Wolfe                                          MRN: [id number]   05/24/2024   The [facility] Quality Team Specialist reviewed this patient medical record for the purposes of chart review for care gap closure. The following were reviewed: chart review for care gap closure-controlling blood pressure.    [facility] Quality Team

## 2024-05-25 ENCOUNTER — Encounter: Payer: Self-pay | Admitting: Adult Health

## 2024-05-25 DIAGNOSIS — K580 Irritable bowel syndrome with diarrhea: Secondary | ICD-10-CM | POA: Insufficient documentation

## 2024-05-25 DIAGNOSIS — F1090 Alcohol use, unspecified, uncomplicated: Secondary | ICD-10-CM | POA: Insufficient documentation

## 2024-05-26 DIAGNOSIS — M170 Bilateral primary osteoarthritis of knee: Secondary | ICD-10-CM | POA: Diagnosis not present

## 2024-05-26 DIAGNOSIS — M1711 Unilateral primary osteoarthritis, right knee: Secondary | ICD-10-CM | POA: Diagnosis not present

## 2024-05-27 ENCOUNTER — Encounter: Payer: Self-pay | Admitting: Internal Medicine

## 2024-05-27 DIAGNOSIS — I10 Essential (primary) hypertension: Secondary | ICD-10-CM

## 2024-05-27 DIAGNOSIS — E785 Hyperlipidemia, unspecified: Secondary | ICD-10-CM

## 2024-05-29 ENCOUNTER — Other Ambulatory Visit: Payer: Self-pay | Admitting: Internal Medicine

## 2024-05-29 DIAGNOSIS — I10 Essential (primary) hypertension: Secondary | ICD-10-CM

## 2024-05-29 DIAGNOSIS — E785 Hyperlipidemia, unspecified: Secondary | ICD-10-CM

## 2024-05-29 MED ORDER — ROSUVASTATIN CALCIUM 20 MG PO TABS: 20.0000 mg | ORAL_TABLET | Freq: Every day | ORAL | 1 refills | Status: DC

## 2024-05-29 MED ORDER — CANDESARTAN CILEXETIL-HCTZ 32-12.5 MG PO TABS: 1.0000 | ORAL_TABLET | Freq: Every day | ORAL | 1 refills | Status: DC

## 2024-05-29 NOTE — Addendum Note (Signed)
 Addended by: SUELLEN DEVIN BROCKS on: 05/29/2024 09:38 AM   Modules accepted: Orders

## 2024-05-29 NOTE — Telephone Encounter (Signed)
 Copied from CRM [id number]. Topic: Clinical - Medication Refill >> May 29, 2024  8:43 AM Antonio H wrote: Medication: rosuvastatin  (CRESTOR ) 20 MG tablet candesartan -hydrochlorothiazide (ATACAND  HCT) 32-12.5 MG tablet  Has the patient contacted their pharmacy? Yes (Agent: If no, request that the patient contact the pharmacy for the refill. If patient does not wish to contact the pharmacy document the reason why and proceed with request.) (Agent: If yes, when and what did the pharmacy advise?) Pharmacy advised patient she had no refills left and to contact office   This is the patient's preferred pharmacy:  [id number] [place], [place] - [address] AT [facility] OF [facility] [address] [facility] [address] [place] [address] Phone: [phone] Fax: [phone]  Is this the correct pharmacy for this prescription? Yes If no, delete pharmacy and type the correct one.   Has the prescription been filled recently? No  Is the patient out of the medication? No, will be out in a couple of days   Has the patient been seen for an appointment in the last year OR does the patient have an upcoming appointment? Yes  Can we respond through MyChart? Yes  Agent: Please be advised that Rx refills may take up to 3 business days. We ask that you follow-up with your pharmacy.

## 2024-06-08 ENCOUNTER — Other Ambulatory Visit (HOSPITAL_BASED_OUTPATIENT_CLINIC_OR_DEPARTMENT_OTHER): Payer: Self-pay

## 2024-06-08 MED ORDER — AREXVY 120 MCG/0.5ML IM SUSR
INTRAMUSCULAR | 0 refills | Status: DC
  Filled 2024-06-08: qty 0.5, 1d supply, fill #0

## 2024-06-14 ENCOUNTER — Other Ambulatory Visit: Payer: Self-pay | Admitting: Vascular Surgery

## 2024-06-14 NOTE — Telephone Encounter (Signed)
 High risk or very high risk warning populated when attempting to refill medication. RX request sent to PCP for review and approval if warranted.

## 2024-06-15 NOTE — Telephone Encounter (Signed)
 Dr. Chales Abrahams please advise

## 2024-06-15 NOTE — Telephone Encounter (Signed)
 RX was sent by physician

## 2024-06-18 ENCOUNTER — Other Ambulatory Visit: Payer: Self-pay | Admitting: Adult Health

## 2024-06-18 DIAGNOSIS — E1159 Type 2 diabetes mellitus with other circulatory complications: Secondary | ICD-10-CM

## 2024-06-21 ENCOUNTER — Other Ambulatory Visit: Payer: Self-pay | Admitting: Adult Health

## 2024-06-25 ENCOUNTER — Other Ambulatory Visit: Payer: Self-pay | Admitting: Internal Medicine

## 2024-06-26 DIAGNOSIS — G629 Polyneuropathy, unspecified: Secondary | ICD-10-CM | POA: Diagnosis not present

## 2024-06-27 ENCOUNTER — Encounter: Payer: Self-pay | Admitting: Internal Medicine

## 2024-06-27 ENCOUNTER — Non-Acute Institutional Stay: Payer: Self-pay | Admitting: Internal Medicine

## 2024-06-27 VITALS — BP 122/78 | HR 75 | Temp 97.9°F | Ht 67.5 in | Wt 183.0 lb

## 2024-06-27 DIAGNOSIS — R7989 Other specified abnormal findings of blood chemistry: Secondary | ICD-10-CM

## 2024-06-27 DIAGNOSIS — E1159 Type 2 diabetes mellitus with other circulatory complications: Secondary | ICD-10-CM | POA: Diagnosis not present

## 2024-06-27 DIAGNOSIS — Z7984 Long term (current) use of oral hypoglycemic drugs: Secondary | ICD-10-CM

## 2024-06-27 MED ORDER — METFORMIN HCL 1000 MG PO TABS: 1000.0000 mg | ORAL_TABLET | Freq: Two times a day (BID) | ORAL | 3 refills | Status: AC

## 2024-06-27 NOTE — Progress Notes (Unsigned)
 Location:   [facility]    Place of Service:   Clinic  Provider:   Code Status:  Goals of Care:     01/12/2024    2:31 PM  Advanced Directives  Does Patient Have a Medical Advance Directive? Yes  Type of Estate Agent of [place];Living will  Does patient want to make changes to medical advance directive? No - Patient declined  Copy of Healthcare Power of Attorney in Chart? Yes - validated most recent copy scanned in chart (See row information)     Chief Complaint  Patient presents with   Follow-up    1 month follow up Patient would like discuss blood sugar levels.    HPI: Patient is a 78 y.o. female seen today for medical management of chronic diseases.    [place] in [place] Hypertension, prediabetes and diet-controlled, HLD, B12 deficiency   Elevated A1C From 6.9 to 7.5 She is monitoring her CBGS 2-3 times a day Discussed the use of AI scribe software for clinical note transcription with the patient, who gave verbal consent to proceed.  History of Present Illness   Victoria Wolfe is a 78 year old female who presents for blood sugar monitoring and management.  She actively monitors her blood sugar levels, which range from 85 to 160 mg/dL. She observes a spike in levels on Sundays after consuming cereal and fruit for brunch instead of her usual two eggs. 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  She takes metformin  750 mg twice daily, increased from 500 mg, without gastrointestinal side effects.  She is aware of the impact of red wine and carbohydrates on her blood sugar and is managing her diet accordingly. She is attempting to lose weight but finds it challenging.  .    Other issues  Elevated LFTs.  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   Arthritis    Per [facility] New Patient Packet    B12 deficiency 03/20/2020   Back pain 09/28/2022   Bilateral malignant neoplasm of breast in female [facility]) 10/18/2018   Breast cancer (HCC)    Per [facility] New Patient Packet    Cancer of overlapping sites of left breast (HCC) 10/18/2018   Cataract    Cough 10/04/2023   Diabetes mellitus without complication (HCC)    Diverticulosis 05/10/2019   Elevated LFTs 09/15/2021   Essential hypertension 10/25/2015   Former smoker 05/18/2023   GERD (gastroesophageal reflux disease)    Hepatic steatosis 05/18/2023   High blood pressure    Per [facility] New Patient Packet    High cholesterol    Per [facility] New Patient Packet    History of colon polyps 05/10/2019   Hyperlipidemia LDL goal <100 10/25/2015   Hyperlipidemia LDL goal <130 10/25/2015   Osteopenia 10/07/2022   Peripheral arterial disease    Pneumonia    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   Shingles    Type 2 diabetes mellitus without complication, without long-term current use of insulin (HCC) 09/15/2021   Vitamin D  deficiency 09/15/2021    Past Surgical History:  Procedure Laterality Date   ABDOMINAL AORTOGRAM W/LOWER EXTREMITY N/A 06/28/2023   Procedure: ABDOMINAL AORTOGRAM W/LOWER EXTREMITY;  Surgeon: Sheree Penne Bruckner, MD;  Location: [facility] INVASIVE CV LAB;  Service: Cardiovascular;  Laterality: N/A;   CATARACT EXTRACTION  2018   Per [facility] New Patient Packet, Dr.Todd Leventhal    CESAREAN SECTION     x2   COLONOSCOPY  2018    Per [facility] new patient packet, Dr.Locker   ILIAC ARTERY STENT N/A    2   MASTECTOMY Bilateral 02/02/2019   Per [facility] New Patient Packet, DrMichele Blackwood and Dr.Colon    MENISCECTOMY Right 2009   MENISCUS REPAIR  2004   Per [facility] New Patient Packet, Dr. Michaele   TOENAIL TRIMMING Left    ingrown in big toe   TONSILLECTOMY  1951   Per [facility] New Patient Packet    Allergies  Allergen Reactions   Amoxicillin  Rash    Had rash after taking amoxil  and zithromax , not sure which one caused the rash   Zithromax  [Azithromycin ] Rash    Had rash after taking zithromax  and amoxil  at the same time, not sure which antibiotic caused the rash    Outpatient Encounter Medications as of 06/27/2024  Medication Sig   amLODipine  (NORVASC ) 5 MG tablet TAKE 1 TABLET (5 MG TOTAL) BY MOUTH DAILY.   aspirin  EC (ASPIRIN  ADULT LOW DOSE) 81 MG tablet Take 81 mg by mouth once.   Blood Glucose Monitoring Suppl DEVI 1 each by Does not apply route in the morning, at noon, and at bedtime. May substitute to any manufacturer covered by patient's insurance.   candesartan -hydrochlorothiazide (ATACAND  HCT) 32-12.5 MG tablet Take 1 tablet by mouth daily.   cholecalciferol (VITAMIN D3) 25 MCG (1000 UNIT) tablet Take 1,000 Units by mouth daily.   clopidogrel  (PLAVIX ) 75 MG tablet TAKE 1 TABLET BY MOUTH EVERY DAY   Cyanocobalamin (VITAMIN B-12) 1000 MCG SUBL Take 1,000 mcg by mouth daily.   esomeprazole (NEXIUM) 20 MG capsule Take 20 mg by mouth daily at 12 noon.   ezetimibe  (ZETIA ) 10 MG tablet TAKE 1 TABLET BY MOUTH EVERY DAY   gabapentin  (NEURONTIN ) 300 MG capsule Take 1 capsule (300 mg total) by mouth at bedtime.   glucose blood (ONETOUCH VERIO) test strip 1 each by Other route 3 (three) times a week. E11.59   Lancets (ONETOUCH DELICA PLUS LANCET33G) MISC USE TO TEST BLOOD SUGAR IN THE MORNING, AT NOON, AND AT BEDTIME   metFORMIN  (GLUCOPHAGE ) 1000 MG tablet Take 1 tablet (1,000 mg total) by mouth 2 (two) times daily with a meal.    metoprolol  succinate (TOPROL  XL) 25 MG 24 hr tablet Take 0.5 tablets (12.5 mg total) by mouth daily.   nystatin  (MYCOSTATIN /NYSTOP ) powder Apply 1 Application topically 3 (three) times daily.   Polyethyl Glycol-Propyl Glycol (SYSTANE) 0.4-0.3 % SOLN Place 1 drop into both eyes every morning.   rosuvastatin  (CRESTOR ) 20 MG tablet Take 1 tablet (20 mg total) by mouth daily.   RSV vaccine recomb adjuvanted (AREXVY ) 120 MCG/0.5ML injection Inject into the muscle.   Saline 0.9 % AERS Place 1 spray into the nose daily at 2 am. Simply Saline   thiamine (VITAMIN B-1) 100 MG tablet Take 100 mg by mouth daily.   vitamin C  (ASCORBIC ACID ) 500  MG tablet Take 500 mg by mouth daily.   [DISCONTINUED] metFORMIN  750 MG TABS Take 750 mg by mouth 2 (two) times daily with a meal.   benzonatate  (TESSALON ) 100 MG capsule Take 1 capsule (100 mg total) by mouth 3 (three) times daily as needed. (Patient not taking: Reported on 06/27/2024)   triamcinolone  (NASACORT ) 55 MCG/ACT AERO nasal inhaler Place 2 sprays into the nose daily. (Patient not taking: Reported on 06/27/2024)   No facility-administered encounter medications on file as of 06/27/2024.    Review of Systems:  Review of Systems  Constitutional:  Negative for activity change and appetite change.  HENT: Negative.    Respiratory:  Negative for cough and shortness of breath.   Cardiovascular:  Negative for leg swelling.  Gastrointestinal:  Negative for constipation.  Genitourinary: Negative.   Musculoskeletal:  Positive for arthralgias. Negative for gait problem and myalgias.  Skin: Negative.   Neurological:  Negative for dizziness and weakness.  Psychiatric/Behavioral:  Negative for confusion, dysphoric mood and sleep disturbance.     Health Maintenance  Topic Date Due   Diabetic kidney evaluation - Urine ACR  Never done   Medicare Annual Wellness (AWV)  08/18/2024 (Originally 05/09/2024)   OPHTHALMOLOGY EXAM  11/03/2024 (Originally 10/24/2024)    DTaP/Tdap/Td (2 - Td or Tdap) 08/27/2024   FOOT EXAM  09/19/2024   HEMOGLOBIN A1C  11/13/2024   COVID-19 Vaccine (8 - Moderna risk 2025-26 season) 11/20/2024   Diabetic kidney evaluation - eGFR measurement  05/16/2025   Pneumococcal Vaccine: 50+ Years  Completed   Influenza Vaccine  Completed   DEXA SCAN  Completed   Hepatitis C Screening  Completed   Zoster Vaccines- Shingrix  Completed   Meningococcal B Vaccine  Aged Out   Hepatitis B Vaccines 19-59 Average Risk  Discontinued   Mammogram  Discontinued   Colonoscopy  Discontinued    Physical Exam: Vitals:   06/27/24 0942  BP: 122/78  Pulse: 75  Temp: 97.9 F (36.6 C)  SpO2: 98%  Weight: 183 lb (83 kg)  Height: 5' 7.5 (1.715 m)   Body mass index is 28.24 kg/m. Physical Exam Vitals reviewed.  Constitutional:      Appearance: Normal appearance.  HENT:     Head: Normocephalic.     Nose: Nose normal.     Mouth/Throat:     Mouth: Mucous membranes are moist.     Pharynx: Oropharynx is clear.  Eyes:     Pupils: Pupils are equal, round, and reactive to light.  Neurological:     General: No focal deficit present.     Mental Status: She is alert and oriented to person, place, and time.     Labs reviewed: Basic Metabolic Panel: Recent Labs    01/11/24 0000 01/11/24 1549 05/16/24 0000  NA 137  --  141  K 4.4  --  4.3  CL 100  --  103  CO2 22  --  23*  BUN 16  --  16  CREATININE 0.6  --  0.7  CALCIUM   --  9.5 10.0  TSH  --   --  3.53   Liver Function Tests: Recent Labs    09/14/23 0000 01/11/24 0000 01/11/24 1549 05/16/24 0000  AST 36* 51*  --  71*  ALT 48* 53*  --  73*  ALKPHOS 81 84  --  76  ALBUMIN 4.5  --  4.3 4.5   No results for input(s): LIPASE, AMYLASE in the last 8760 hours. No results  for input(s): AMMONIA in the last 8760 hours. CBC: Recent Labs    01/11/24 0000 05/16/24 0000  WBC 6.9 5.9  HGB 12.6 13.4  HCT 97* 40  PLT 216 214   Lipid Panel: Recent Labs    09/14/23 0000  01/11/24 0000 05/16/24 0000  CHOL 173 142 138  HDL 59 51 49  LDLCALC 95 70 61  TRIG 95 105 142   Lab Results  Component Value Date   HGBA1C 7.5 05/16/2024    Procedures since last visit: No results found.  Assessment/Plan  Assessment and Plan    Type 2 diabetes mellitus HbA1c increased from 6.9% to 7.5%. Blood glucose generally controlled with occasional spikes. Current regimen includes metformin  750 mg BID.  She is hesitant about Ozempic due to side effects.   - Increase metformin  to 1000 mg BID. - Monitor blood glucose levels, particularly fasting levels in the morning. - Consider low-dose Ozempic in three months if HbA1c does not improve with increased metformin  and dietary management. - Discuss CGM with her and consider trial if interested. - Schedule follow-up appointment in three months with blood work prior to the visit.  Nonalcoholic steatohepatitis (NASH) Potential benefit from weight loss and improved glycemic control. Discussed Ozempic's role in reducing liver fat. Metformin  may aid in managing NASH. - Consider low-dose Ozempic in the future for NASH management if glycemic control and weight loss are insufficient.  General Health Maintenance  Follow-Up - Follow-up appointment scheduled in three months with [facility]. Blood work to be completed prior to the visit.        Labs/tests ordered:  Labs Ordered Next appt:  Visit date not found    CBGS fasting 120-135 After the meals 140-150

## 2024-07-07 DIAGNOSIS — M170 Bilateral primary osteoarthritis of knee: Secondary | ICD-10-CM | POA: Diagnosis not present

## 2024-07-25 ENCOUNTER — Encounter: Payer: Self-pay | Admitting: Internal Medicine

## 2024-07-26 ENCOUNTER — Encounter: Payer: Self-pay | Admitting: Internal Medicine

## 2024-07-31 ENCOUNTER — Other Ambulatory Visit: Payer: Self-pay | Admitting: Cardiology

## 2024-08-01 DIAGNOSIS — K080 Exfoliation of teeth due to systemic causes: Secondary | ICD-10-CM | POA: Diagnosis not present

## 2024-08-10 ENCOUNTER — Ambulatory Visit: Payer: Self-pay | Admitting: Orthopedic Surgery

## 2024-09-08 ENCOUNTER — Other Ambulatory Visit: Payer: Self-pay | Admitting: Internal Medicine

## 2024-09-08 DIAGNOSIS — E785 Hyperlipidemia, unspecified: Secondary | ICD-10-CM

## 2024-09-11 ENCOUNTER — Other Ambulatory Visit: Payer: Self-pay | Admitting: Internal Medicine

## 2024-09-11 DIAGNOSIS — I10 Essential (primary) hypertension: Secondary | ICD-10-CM

## 2024-09-20 ENCOUNTER — Telehealth: Payer: Self-pay | Admitting: Internal Medicine

## 2024-09-20 NOTE — Telephone Encounter (Signed)
 Received a pre-op clearance form from [facility] placed in the provider's folder for review.

## 2024-09-21 LAB — HEPATITIS B SURFACE ANTIGEN
HM Hepatitis Screen: NEGATIVE
Hepatitis B Surface Ag: NONREACTIVE

## 2024-09-21 LAB — HEMOGLOBIN A1C: Hemoglobin A1C: 6.5

## 2024-09-25 ENCOUNTER — Encounter: Admitting: Adult Health

## 2024-09-26 NOTE — Progress Notes (Addendum)
 Date of COVID positive in last 90 days:  PCP - Fredia Bring, MD Cardiologist - Dub Huntsman, DO  Chest x-ray - 12-20-23 Epic EKG -  Stress Test - N/A ECHO - 10-28-23 Epic Cardiac Cath - N/A Long Term Monitor - 10-25-23 Epic Pacemaker/ICD device last checked:N/A Spinal Cord Stimulator:N/A  Bowel Prep - N/A  Sleep Study - N/A CPAP -   Fasting Blood Sugar - N/A Checks Blood Sugar _____ times a day  Last dose of GLP1 agonist-  N/A GLP1 instructions:  Do not take after     Last dose of SGLT-2 inhibitors-  N/A SGLT-2 instructions:  Do not take after     Blood Thinner Instructions: Plavix   Last dose:   Time: Aspirin  Instructions:  ASA 81 Last Dose:  Activity level:  Can go up a flight of stairs and perform activities of daily living without stopping and without symptoms of chest pain or shortness of breath.  Able to exercise without symptoms  Unable to go up a flight of stairs without symptoms of     Anesthesia review: PAD with stent placement, HTN, DM  Patient denies shortness of breath, fever, cough and chest pain at PAT appointment  Patient verbalized understanding of instructions that were given to them at the PAT appointment. Patient was also instructed that they will need to review over the PAT instructions again at home before surgery.

## 2024-09-27 ENCOUNTER — Other Ambulatory Visit: Payer: Self-pay

## 2024-09-27 ENCOUNTER — Encounter (HOSPITAL_COMMUNITY)
Admission: RE | Admit: 2024-09-27 | Discharge: 2024-09-27 | Disposition: A | Source: Ambulatory Visit | Attending: Specialist | Admitting: Specialist

## 2024-09-27 ENCOUNTER — Encounter (HOSPITAL_COMMUNITY): Payer: Self-pay

## 2024-09-27 VITALS — BP 130/77 | HR 81 | Temp 99.1°F | Resp 12 | Ht 66.75 in | Wt 168.4 lb

## 2024-09-27 DIAGNOSIS — E119 Type 2 diabetes mellitus without complications: Secondary | ICD-10-CM | POA: Insufficient documentation

## 2024-09-27 DIAGNOSIS — Z01818 Encounter for other preprocedural examination: Secondary | ICD-10-CM | POA: Insufficient documentation

## 2024-09-27 LAB — BASIC METABOLIC PANEL WITH GFR
Anion gap: 13 (ref 5–15)
BUN: 20 mg/dL (ref 8–23)
CO2: 26 mmol/L (ref 22–32)
Calcium: 10.2 mg/dL (ref 8.9–10.3)
Chloride: 102 mmol/L (ref 98–111)
Creatinine, Ser: 0.78 mg/dL (ref 0.44–1.00)
GFR, Estimated: >60 mL/min
Glucose, Bld: 128 mg/dL — ABNORMAL HIGH (ref 70–99)
Potassium: 4.6 mmol/L (ref 3.5–5.1)
Sodium: 141 mmol/L (ref 135–145)

## 2024-09-27 LAB — CBC
HCT: 38.9 % (ref 36.0–46.0)
Hemoglobin: 12.8 g/dL (ref 12.0–15.0)
MCH: 31.7 pg (ref 26.0–34.0)
MCHC: 32.9 g/dL (ref 30.0–36.0)
MCV: 96.3 fL (ref 80.0–100.0)
Platelets: 245 10*3/uL (ref 150–400)
RBC: 4.04 MIL/uL (ref 3.87–5.11)
RDW: 13 % (ref 11.5–15.5)
WBC: 8.9 10*3/uL (ref 4.0–10.5)
nRBC: 0 % (ref 0.0–0.2)

## 2024-09-27 LAB — GLUCOSE, CAPILLARY: Glucose-Capillary: 144 mg/dL — ABNORMAL HIGH (ref 70–99)

## 2024-09-27 LAB — SURGICAL PCR SCREEN
MRSA, PCR: NEGATIVE
Staphylococcus aureus: NEGATIVE

## 2024-09-28 NOTE — Telephone Encounter (Signed)
 Form was placed in the outgoing fax and faxed by the administration staff.

## 2024-09-28 NOTE — Telephone Encounter (Signed)
 I have send the complete form with [facility]

## 2024-10-02 ENCOUNTER — Non-Acute Institutional Stay: Admitting: Adult Health

## 2024-10-04 ENCOUNTER — Encounter (HOSPITAL_COMMUNITY): Admission: RE | Payer: Self-pay | Source: Home / Self Care

## 2024-10-04 ENCOUNTER — Ambulatory Visit (HOSPITAL_COMMUNITY): Admission: RE | Admit: 2024-10-04 | Source: Home / Self Care | Admitting: Specialist

## 2024-10-24 ENCOUNTER — Encounter (INDEPENDENT_AMBULATORY_CARE_PROVIDER_SITE_OTHER): Admitting: Ophthalmology

## 2024-11-20 ENCOUNTER — Ambulatory Visit: Admitting: Obstetrics and Gynecology

## 2024-12-19 ENCOUNTER — Ambulatory Visit: Admitting: Nurse Practitioner
